# Patient Record
Sex: Female | Born: 1950
Health system: Southern US, Community
[De-identification: ages and names within clinical notes are randomized; demographics above are authoritative.]

## PROBLEM LIST (undated history)

## (undated) DIAGNOSIS — I10 Essential (primary) hypertension: Secondary | ICD-10-CM

## (undated) DIAGNOSIS — K219 Gastro-esophageal reflux disease without esophagitis: Secondary | ICD-10-CM

## (undated) DIAGNOSIS — C8590 Non-Hodgkin lymphoma, unspecified, unspecified site: Secondary | ICD-10-CM

## (undated) DIAGNOSIS — E669 Obesity, unspecified: Secondary | ICD-10-CM

## (undated) DIAGNOSIS — C801 Malignant (primary) neoplasm, unspecified: Secondary | ICD-10-CM

## (undated) DIAGNOSIS — E785 Hyperlipidemia, unspecified: Secondary | ICD-10-CM

## (undated) DIAGNOSIS — H269 Unspecified cataract: Secondary | ICD-10-CM

## (undated) DIAGNOSIS — M1990 Unspecified osteoarthritis, unspecified site: Secondary | ICD-10-CM

## (undated) DIAGNOSIS — T7840XA Allergy, unspecified, initial encounter: Secondary | ICD-10-CM

## (undated) DIAGNOSIS — N200 Calculus of kidney: Secondary | ICD-10-CM

## (undated) DIAGNOSIS — M359 Systemic involvement of connective tissue, unspecified: Secondary | ICD-10-CM

## (undated) DIAGNOSIS — J449 Chronic obstructive pulmonary disease, unspecified: Secondary | ICD-10-CM

## (undated) DIAGNOSIS — M069 Rheumatoid arthritis, unspecified: Secondary | ICD-10-CM

## (undated) DIAGNOSIS — M810 Age-related osteoporosis without current pathological fracture: Secondary | ICD-10-CM

## (undated) HISTORY — DX: Obesity, unspecified: E66.9

## (undated) HISTORY — DX: Hyperlipidemia, unspecified: E78.5

## (undated) HISTORY — DX: Gastro-esophageal reflux disease without esophagitis: K21.9

## (undated) HISTORY — PX: BREAST BIOPSY: SHX20

## (undated) HISTORY — DX: Essential (primary) hypertension: I10

## (undated) HISTORY — DX: Calculus of kidney: N20.0

## (undated) HISTORY — DX: Age-related osteoporosis without current pathological fracture: M81.0

## (undated) HISTORY — DX: Unspecified porphyria: E80.20

## (undated) HISTORY — DX: Rheumatoid arthritis, unspecified: M06.9

## (undated) HISTORY — DX: Unspecified osteoarthritis, unspecified site: M19.90

## (undated) HISTORY — DX: Allergy, unspecified, initial encounter: T78.40XA

## (undated) HISTORY — DX: Unspecified cataract: H26.9

## (undated) HISTORY — DX: Malignant (primary) neoplasm, unspecified: C80.1

---

## 1981-05-26 HISTORY — PX: APPENDECTOMY: SHX54

## 1991-05-27 DIAGNOSIS — M069 Rheumatoid arthritis, unspecified: Secondary | ICD-10-CM

## 1991-05-27 HISTORY — DX: Rheumatoid arthritis, unspecified: M06.9

## 1997-05-26 DIAGNOSIS — N200 Calculus of kidney: Secondary | ICD-10-CM

## 1997-05-26 HISTORY — DX: Calculus of kidney: N20.0

## 1998-05-26 HISTORY — PX: LITHOTRIPSY: SUR834

## 2000-10-08 ENCOUNTER — Encounter (HOSPITAL_COMMUNITY): Admission: RE | Admit: 2000-10-08 | Discharge: 2000-11-07 | Payer: Self-pay | Admitting: Rheumatology

## 2000-12-24 ENCOUNTER — Encounter (HOSPITAL_COMMUNITY): Admission: RE | Admit: 2000-12-24 | Discharge: 2001-01-23 | Payer: Self-pay | Admitting: Rheumatology

## 2001-03-18 ENCOUNTER — Encounter (HOSPITAL_COMMUNITY): Admission: RE | Admit: 2001-03-18 | Discharge: 2001-04-17 | Payer: Self-pay | Admitting: Rheumatology

## 2001-06-17 ENCOUNTER — Encounter (HOSPITAL_COMMUNITY): Admission: RE | Admit: 2001-06-17 | Discharge: 2001-07-17 | Payer: Self-pay | Admitting: Rheumatology

## 2001-10-21 ENCOUNTER — Encounter (HOSPITAL_COMMUNITY): Admission: RE | Admit: 2001-10-21 | Discharge: 2001-11-20 | Payer: Self-pay | Admitting: Rheumatology

## 2002-03-17 ENCOUNTER — Ambulatory Visit (HOSPITAL_COMMUNITY): Admission: RE | Admit: 2002-03-17 | Discharge: 2002-03-17 | Payer: Self-pay | Admitting: Family Medicine

## 2002-03-17 ENCOUNTER — Encounter: Payer: Self-pay | Admitting: Family Medicine

## 2002-07-14 ENCOUNTER — Encounter (HOSPITAL_COMMUNITY): Admission: RE | Admit: 2002-07-14 | Discharge: 2002-08-13 | Payer: Self-pay | Admitting: Rheumatology

## 2003-05-29 ENCOUNTER — Ambulatory Visit (HOSPITAL_COMMUNITY): Admission: RE | Admit: 2003-05-29 | Discharge: 2003-05-29 | Payer: Self-pay | Admitting: Family Medicine

## 2003-06-07 ENCOUNTER — Ambulatory Visit (HOSPITAL_COMMUNITY): Admission: RE | Admit: 2003-06-07 | Discharge: 2003-06-07 | Payer: Self-pay | Admitting: Family Medicine

## 2004-05-17 ENCOUNTER — Ambulatory Visit: Payer: Self-pay | Admitting: Family Medicine

## 2004-05-30 ENCOUNTER — Ambulatory Visit (HOSPITAL_COMMUNITY): Admission: RE | Admit: 2004-05-30 | Discharge: 2004-05-30 | Payer: Self-pay | Admitting: Family Medicine

## 2004-07-05 ENCOUNTER — Ambulatory Visit: Payer: Self-pay | Admitting: Family Medicine

## 2004-07-09 ENCOUNTER — Encounter: Payer: Self-pay | Admitting: Family Medicine

## 2004-08-30 ENCOUNTER — Ambulatory Visit: Payer: Self-pay | Admitting: Family Medicine

## 2004-10-10 ENCOUNTER — Ambulatory Visit (HOSPITAL_COMMUNITY): Admission: RE | Admit: 2004-10-10 | Discharge: 2004-10-10 | Payer: Self-pay | Admitting: General Surgery

## 2004-10-10 LAB — HM COLONOSCOPY: HM Colonoscopy: NORMAL

## 2005-04-28 ENCOUNTER — Ambulatory Visit: Payer: Self-pay | Admitting: Family Medicine

## 2005-08-08 ENCOUNTER — Ambulatory Visit: Payer: Self-pay | Admitting: Family Medicine

## 2006-01-01 ENCOUNTER — Ambulatory Visit (HOSPITAL_COMMUNITY): Admission: RE | Admit: 2006-01-01 | Discharge: 2006-01-01 | Payer: Self-pay | Admitting: Family Medicine

## 2006-01-16 ENCOUNTER — Encounter: Admission: RE | Admit: 2006-01-16 | Discharge: 2006-01-16 | Payer: Self-pay | Admitting: Family Medicine

## 2006-06-16 ENCOUNTER — Ambulatory Visit: Payer: Self-pay | Admitting: Family Medicine

## 2006-06-16 ENCOUNTER — Encounter: Payer: Self-pay | Admitting: Family Medicine

## 2006-06-16 ENCOUNTER — Other Ambulatory Visit: Admission: RE | Admit: 2006-06-16 | Discharge: 2006-06-16 | Payer: Self-pay | Admitting: Family Medicine

## 2006-09-22 ENCOUNTER — Ambulatory Visit: Payer: Self-pay | Admitting: Family Medicine

## 2006-12-12 ENCOUNTER — Emergency Department (HOSPITAL_COMMUNITY): Admission: EM | Admit: 2006-12-12 | Discharge: 2006-12-12 | Payer: Self-pay | Admitting: Emergency Medicine

## 2006-12-15 ENCOUNTER — Ambulatory Visit: Payer: Self-pay | Admitting: Family Medicine

## 2007-04-30 ENCOUNTER — Encounter: Payer: Self-pay | Admitting: Family Medicine

## 2007-06-17 ENCOUNTER — Encounter: Payer: Self-pay | Admitting: Family Medicine

## 2007-06-17 LAB — CONVERTED CEMR LAB: Pap Smear: NORMAL

## 2007-08-09 ENCOUNTER — Ambulatory Visit: Payer: Self-pay | Admitting: Family Medicine

## 2007-08-12 DIAGNOSIS — E785 Hyperlipidemia, unspecified: Secondary | ICD-10-CM

## 2007-08-12 DIAGNOSIS — I10 Essential (primary) hypertension: Secondary | ICD-10-CM | POA: Insufficient documentation

## 2007-10-06 ENCOUNTER — Encounter: Payer: Self-pay | Admitting: Family Medicine

## 2007-10-06 LAB — CONVERTED CEMR LAB
CO2: 26 meq/L (ref 19–32)
Chloride: 104 meq/L (ref 96–112)
Cholesterol: 228 mg/dL — ABNORMAL HIGH (ref 0–200)
Glucose, Bld: 82 mg/dL (ref 70–99)
Lymphocytes Relative: 38 % (ref 12–46)
Lymphs Abs: 2.4 10*3/uL (ref 0.7–4.0)
Neutro Abs: 3.1 10*3/uL (ref 1.7–7.7)
Neutrophils Relative %: 48 % (ref 43–77)
Platelets: 284 10*3/uL (ref 150–400)
Potassium: 4.7 meq/L (ref 3.5–5.3)
Sodium: 140 meq/L (ref 135–145)
TSH: 0.933 microintl units/mL (ref 0.350–5.50)
Total CHOL/HDL Ratio: 4.8
VLDL: 26 mg/dL (ref 0–40)
WBC: 6.4 10*3/uL (ref 4.0–10.5)

## 2007-10-07 ENCOUNTER — Ambulatory Visit: Payer: Self-pay | Admitting: Family Medicine

## 2007-10-07 ENCOUNTER — Encounter: Payer: Self-pay | Admitting: Family Medicine

## 2007-10-07 ENCOUNTER — Other Ambulatory Visit: Admission: RE | Admit: 2007-10-07 | Discharge: 2007-10-07 | Payer: Self-pay | Admitting: Family Medicine

## 2007-10-07 LAB — CONVERTED CEMR LAB
AST: 17 units/L (ref 0–37)
Albumin: 4 g/dL (ref 3.5–5.2)
Total Bilirubin: 0.3 mg/dL (ref 0.3–1.2)

## 2008-02-22 ENCOUNTER — Ambulatory Visit: Payer: Self-pay | Admitting: Family Medicine

## 2008-02-22 DIAGNOSIS — J209 Acute bronchitis, unspecified: Secondary | ICD-10-CM

## 2008-02-22 DIAGNOSIS — J0100 Acute maxillary sinusitis, unspecified: Secondary | ICD-10-CM

## 2008-05-20 ENCOUNTER — Emergency Department (HOSPITAL_COMMUNITY): Admission: EM | Admit: 2008-05-20 | Discharge: 2008-05-20 | Payer: Self-pay | Admitting: Emergency Medicine

## 2008-05-22 ENCOUNTER — Ambulatory Visit: Payer: Self-pay | Admitting: Family Medicine

## 2008-05-22 DIAGNOSIS — J029 Acute pharyngitis, unspecified: Secondary | ICD-10-CM

## 2008-05-22 DIAGNOSIS — R5383 Other fatigue: Secondary | ICD-10-CM

## 2008-05-22 DIAGNOSIS — R5381 Other malaise: Secondary | ICD-10-CM

## 2008-05-22 LAB — CONVERTED CEMR LAB
Eosinophils Relative: 2 % (ref 0–5)
HCT: 42.2 % (ref 36.0–46.0)
Lymphocytes Relative: 29 % (ref 12–46)
Lymphs Abs: 2.5 10*3/uL (ref 0.7–4.0)
Platelets: 285 10*3/uL (ref 150–400)
WBC: 8.6 10*3/uL (ref 4.0–10.5)

## 2008-08-24 ENCOUNTER — Encounter: Payer: Self-pay | Admitting: Family Medicine

## 2008-08-28 ENCOUNTER — Telehealth: Payer: Self-pay | Admitting: Family Medicine

## 2008-08-29 ENCOUNTER — Encounter: Payer: Self-pay | Admitting: Family Medicine

## 2008-11-03 ENCOUNTER — Ambulatory Visit: Payer: Self-pay | Admitting: Family Medicine

## 2008-11-03 ENCOUNTER — Other Ambulatory Visit: Admission: RE | Admit: 2008-11-03 | Discharge: 2008-11-03 | Payer: Self-pay | Admitting: Family Medicine

## 2008-11-03 ENCOUNTER — Encounter: Payer: Self-pay | Admitting: Family Medicine

## 2009-03-02 ENCOUNTER — Encounter: Payer: Self-pay | Admitting: Family Medicine

## 2009-06-12 ENCOUNTER — Encounter: Payer: Self-pay | Admitting: Family Medicine

## 2009-09-04 ENCOUNTER — Encounter (INDEPENDENT_AMBULATORY_CARE_PROVIDER_SITE_OTHER): Payer: Self-pay

## 2009-09-04 ENCOUNTER — Ambulatory Visit: Payer: Self-pay | Admitting: Family Medicine

## 2009-09-04 DIAGNOSIS — J309 Allergic rhinitis, unspecified: Secondary | ICD-10-CM

## 2009-09-04 DIAGNOSIS — M25579 Pain in unspecified ankle and joints of unspecified foot: Secondary | ICD-10-CM

## 2009-12-11 ENCOUNTER — Ambulatory Visit (HOSPITAL_COMMUNITY): Admission: RE | Admit: 2009-12-11 | Discharge: 2009-12-11 | Payer: Self-pay | Admitting: Family Medicine

## 2009-12-18 ENCOUNTER — Ambulatory Visit: Payer: Self-pay | Admitting: Family Medicine

## 2009-12-18 ENCOUNTER — Other Ambulatory Visit: Admission: RE | Admit: 2009-12-18 | Discharge: 2009-12-18 | Payer: Self-pay | Admitting: Family Medicine

## 2009-12-18 LAB — HM PAP SMEAR: HM Pap smear: NORMAL

## 2009-12-19 ENCOUNTER — Encounter: Payer: Self-pay | Admitting: Family Medicine

## 2009-12-19 LAB — CONVERTED CEMR LAB: Pap Smear: NEGATIVE

## 2010-06-11 ENCOUNTER — Telehealth: Payer: Self-pay | Admitting: Family Medicine

## 2010-06-15 ENCOUNTER — Encounter: Payer: Self-pay | Admitting: Family Medicine

## 2010-06-16 ENCOUNTER — Encounter: Payer: Self-pay | Admitting: Family Medicine

## 2010-06-17 ENCOUNTER — Encounter: Payer: Self-pay | Admitting: Family Medicine

## 2010-06-25 NOTE — Miscellaneous (Signed)
Summary: flu shot  Clinical Lists Changes  Observations: Added new observation of FLU VAX: Historical (05/17/2009 15:42)      Influenza Immunization History:    Influenza # 1:  Historical (05/17/2009)

## 2010-06-25 NOTE — Assessment & Plan Note (Signed)
Summary: ov   Vital Signs:  Patient profile:   60 year old female Height:      67.5 inches Weight:      213.25 pounds BMI:     33.03 O2 Sat:      98 % Pulse rate:   94 / minute Pulse rhythm:   regular Resp:     16 per minute BP sitting:   128 / 80  (right arm) Cuff size:   large  Vitals Entered By: Everitt Amber LPN (September 04, 2009 3:24 PM)  Nutrition Counseling: Patient's BMI is greater than 25 and therefore counseled on weight management options. CC: has been sneezing and sunday she got really congested and started getting worse, itchy throat, nose running and right ear feels stopped up, coughing at night from drainage   CC:  has been sneezing and sunday she got really congested and started getting worse, itchy throat, nose running and right ear feels stopped up, and coughing at night from drainage.  History of Present Illness: Pt was in good health up until 1 week ago when she developed progressive head and chest congestion. in the past 3 days she has worsened to the extent that she feels unable to work. She has been attemptin g lifestyle change to improve her health as far as diet and physical activitare concerned. 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  Current Medications (verified): 1)  Fish Oil Concentrate 1000 Mg  Caps (Omega-3 Fatty Acids) .... Take 1 Capsule By Mouth Once A Day 2)  Caltrate 600+d Plus 600-400 Mg-Unit  Tabs (Calcium Carbonate-Vit D-Min) .... Take 1 Tablet By Mouth Two Times A Day 3)  Centrum Silver   Tabs (Multiple Vitamins-Minerals) .... Take 1 Tablet By Mouth Once A Day 4)  Aspirin Adult Low Strength 81 Mg Tbec (Aspirin) .... Take 1 Tab By Mouth Once Daily 5)  Lydia Pinkham  Tabs (Misc Natural Products) .... 2 Tabs By Mouth Once Daily 6)  Allegra-D 12 Hour 60-120 Mg Xr12h-Tab (Fexofenadine-Pseudoephedrine) .... Take 1 Tablet By Mouth Two Times A Day 7)  Ibuprofen 800 Mg Tabs (Ibuprofen)  .... Take 1 Tablet By Mouth Three Times A Day 8)  Crestor 10 Mg Tabs (Rosuvastatin Calcium) .... Take 1 Tab By Mouth At Bedtime 9)  Diovan 80 Mg Tabs (Valsartan) .... Take 1 Tab By Mouth At Bedtime  Allergies (verified): 1)  ! Dilantin 2)  ! Barbiturates 3)  ! * Alcohol 4)  ! Vioxx 5)  ! Vicodin 6)  ! Ceftin 7)  ! * Librium 8)  ! * Diabenese 9)  ! * Porphrisa 10)  ! * Oriviase 11)  ! * Na Pentithal  Review of Systems      See HPI General:  Complains of chills, fatigue, and weakness; denies fever. Eyes:  Denies blurring, discharge, and red eye. ENT:  Complains of decreased hearing, earache, nasal congestion, postnasal drainage, sinus pressure, and sore throat; 1 week history, fatigue, no skleep cough, generalised aches , low grade fever. CV:  Denies chest pain or discomfort, difficulty breathing while lying down, palpitations, and swelling of feet. Resp:  Complains of cough, sputum productive, and wheezing; cough and scant sputum which is yellow in the past 5 days. GI:  Denies abdominal pain, constipation, diarrhea, nausea, and vomiting. GU:  Denies dysuria and urinary frequency. MS:  Complains of joint pain and stiffness; right ankle pain and stiffness persisteing, limiting pt's ability  to walk for exercise. Derm:  Denies itching, lesion(s), and rash. Neuro:  Complains of headaches; denies memory loss, poor balance, seizures, and sensation of room spinning. Psych:  Denies anxiety and depression. Endo:  Denies cold intolerance, excessive hunger, excessive thirst, excessive urination, heat intolerance, polyuria, and weight change. Heme:  Denies abnormal bruising and bleeding. Allergy:  Complains of seasonal allergies and sneezing; denies itching eyes; 12week h/o progressive symptoms.  Physical Exam  General:  ill appearing, in no c/p distress. HEENT: No facial asymmetry,  EOMI, ethmoidal and maxillary sinus tenderness, TM's dull with poor light reflex, oropharynx  pink and moist.  post cervical adenitis  Chest: adequate air entry, scattered crackles, few wheezes  CVS: S1, S2, No murmurs, No S3.   Abd: Soft, Nontender.  MS: Adequate ROM spine, hips, shoulders and knees. decrteased rOM left ankle with swelling and tenderness on the lateral aspect. Ext: No edema.   CNS: CN 2-12 intact, power tone and sensation normal throughout.   Skin: Intact, no visible lesions or rashes.  Psych: Good eye contact, normal affect.  Memory intact, not anxious or depressed appearing.     Impression & Recommendations:  Problem # 1:  ANKLE PAIN, RIGHT (ICD-719.47) Assessment Unchanged  Orders: Orthopedic Referral (Ortho)  Problem # 2:  ALLERGIC RHINITIS (ICD-477.9) Assessment: Deteriorated  The following medications were removed from the medication list:    Astelin 137 Mcg/spray Soln (Azelastine hcl) .Marland Kitchen..Marland Kitchen Two sprays each nostril twice daily    Rhinocort Aqua 32 Mcg/act Susp (Budesonide (nasal)) .Marland Kitchen... Take 2 sprays in each nostril once a day    Loratadine 10 Mg Tabs (Loratadine) .Marland Kitchen... Take 1 tablet by mouth once a day  Orders: Depo- Medrol 80mg  (J1040) Admin of Therapeutic Inj  intramuscular or subcutaneous ([address])  Problem # 3:  ACUTE BRONCHITIS (ICD-466.0) Assessment: Comment Only  The following medications were removed from the medication list:    Levaquin 750 Mg Tabs (Levofloxacin) .Marland Kitchen... Take 1 tablet by mouth once a day    Tessalon Perles 100 Mg Caps (Benzonatate) .Marland Kitchen... Take 1 capsule by mouth three times a day    Tussionex Pennkinetic Er 8-10 Mg/26ml Lqcr (Chlorpheniramine-hydrocodone) .Marland Kitchen... 5cc twice daily as needed    Symbicort 80-4.5 Mcg/act Aero (Budesonide-formoterol fumarate) .Marland Kitchen... 2 puffs twice daily    Zithromax 250 Mg Tabs (Azithromycin) .Marland Kitchen... Take 2 tablets today then 1 daily for an additional 4 days. Her updated medication list for this problem includes:    Allegra-d 12 Hour 60-120 Mg Xr12h-tab (Fexofenadine-pseudoephedrine) .Marland Kitchen... Take 1 tablet by mouth two  times a day    Zithromax Z-pak 250 Mg Tabs (Azithromycin) ..... Use as directed    Singulair 10 Mg Tabs (Montelukast sodium) .Marland Kitchen... Take 1 tablet by mouth once a day  Problem # 4:  ACUTE MAXILLARY SINUSITIS (ICD-461.0) Assessment: Comment Only  The following medications were removed from the medication list:    Astelin 137 Mcg/spray Soln (Azelastine hcl) .Marland Kitchen..Marland Kitchen Two sprays each nostril twice daily    Rhinocort Aqua 32 Mcg/act Susp (Budesonide (nasal)) .Marland Kitchen... Take 2 sprays in each nostril once a day    Levaquin 750 Mg Tabs (Levofloxacin) .Marland Kitchen... Take 1 tablet by mouth once a day    Tessalon Perles 100 Mg Caps (Benzonatate) .Marland Kitchen... Take 1 capsule by mouth three times a day    Tussionex Pennkinetic Er 8-10 Mg/45ml Lqcr (Chlorpheniramine-hydrocodone) .Marland Kitchen... 5cc twice daily as needed    Zithromax 250 Mg Tabs (Azithromycin) .Marland Kitchen... Take 2 tablets today then 1 daily for an additional  4 days. Her updated medication list for this problem includes:    Allegra-d 12 Hour 60-120 Mg Xr12h-tab (Fexofenadine-pseudoephedrine) .Marland Kitchen... Take 1 tablet by mouth two times a day    Zithromax Z-pak 250 Mg Tabs (Azithromycin) ..... Use as directed  Problem # 5:  HYPERTENSION (ICD-401.9) Assessment: Unchanged  The following medications were removed from the medication list:    Diovan 80 Mg Tabs (Valsartan) ..... One tab by mouth qd Her updated medication list for this problem includes:    Diovan 80 Mg Tabs (Valsartan) .Marland Kitchen... Take 1 tab by mouth at bedtime  BP today: 128/80 Prior BP: 112/80 (11/03/2008)  Labs Reviewed: K+: 4.7 (10/06/2007) Creat: : 0.95 (10/06/2007)   Chol: 228 (10/06/2007)   HDL: 48 (10/06/2007)   LDL: 154 (10/06/2007)   TG: 128 (10/06/2007)  Problem # 6:  HYPERLIPIDEMIA (ICD-272.4) Assessment: Comment Only  Her updated medication list for this problem includes:    Crestor 10 Mg Tabs (Rosuvastatin calcium) .Marland Kitchen... Take 1 tab by mouth at bedtime  Labs Reviewed: SGOT: 17 (10/07/2007)   SGPT: 19  (10/07/2007)   HDL:48 (10/06/2007)  LDL:154 (10/06/2007)  Chol:228 (10/06/2007)  Trig:128 (10/06/2007)needs fasting labs  Complete Medication List: 1)  Fish Oil Concentrate 1000 Mg Caps (Omega-3 fatty acids) .... Take 1 capsule by mouth once a day 2)  Caltrate 600+d Plus 600-400 Mg-unit Tabs (Calcium carbonate-vit d-min) .... Take 1 tablet by mouth two times a day 3)  Centrum Silver Tabs (Multiple vitamins-minerals) .... Take 1 tablet by mouth once a day 4)  Aspirin Adult Low Strength 81 Mg Tbec (Aspirin) .... Take 1 tab by mouth once daily 5)  Lydia Pinkham Tabs (Misc natural products) .... 2 tabs by mouth once daily 6)  Allegra-d 12 Hour 60-120 Mg Xr12h-tab (Fexofenadine-pseudoephedrine) .... Take 1 tablet by mouth two times a day 7)  Ibuprofen 800 Mg Tabs (Ibuprofen) .... Take 1 tablet by mouth three times a day 8)  Crestor 10 Mg Tabs (Rosuvastatin calcium) .... Take 1 tab by mouth at bedtime 9)  Diovan 80 Mg Tabs (Valsartan) .... Take 1 tab by mouth at bedtime 10)  Prednisone (pak) 10 Mg Tabs (Prednisone) .... Use as directed 11)  Zithromax Z-pak 250 Mg Tabs (Azithromycin) .... Use as directed 12)  Singulair 10 Mg Tabs (Montelukast sodium) .... Take 1 tablet by mouth once a day  Patient Instructions: 1)  CPE when due  end July 2)  You are being  treated for uncontrolled allergies, you will get injection in the offfice and meds are sent in also Prescriptions: SINGULAIR 10 MG TABS (MONTELUKAST SODIUM) Take 1 tablet by mouth once a day  #30 x 0   Entered and Authorized by:   Syliva Overman MD   Signed by:   Syliva Overman MD on 09/04/2009   Method used:   Printed then faxed to ...       [facility] [address] [facility] (retail)       [address]       [place], [place]  [address]       Ph: [phone]       Fax: [phone]   RxID:   [id number] ZITHROMAX Z-PAK 250 MG TABS (AZITHROMYCIN) Use as directed  #6 x 0   Entered and Authorized by:   Syliva Overman MD    Signed by:   Syliva Overman MD on 09/04/2009   Method used:   Electronically to  [facility] [address] [facility] (retail)       [address]       [place], [place]  [address]       Ph: [phone]       Fax: [phone]   RxID:   [id number] PREDNISONE (PAK) 10 MG TABS (PREDNISONE) Use as directed  #21 x 0   Entered and Authorized by:   Syliva Overman MD   Signed by:   Syliva Overman MD on 09/04/2009   Method used:   Electronically to        [facility]  [address] (retail)       [address]       [place], [place]  [address]       Ph: [phone]       Fax: [phone]   RxID:   [id number]      Medication Administration  Injection # 1:    Medication: Depo- Medrol 80mg     Diagnosis: ALLERGIC RHINITIS (ICD-477.9)    Route: IM    Site: RUOQ gluteus    Exp Date: 03/2010    Lot #: [id number]     Mfr: Pharmacia    Comments: 80mg  given     Patient tolerated injection without complications    Given by: Everitt Amber LPN (September 04, 2009 4:33 PM)  Orders Added: 1)  Est. Patient Level IV [[address]] 2)  Orthopedic Referral [Ortho] 3)  Depo- Medrol 80mg  [J1040] 4)  Admin of Therapeutic Inj  intramuscular or subcutaneous [[address]]

## 2010-06-25 NOTE — Assessment & Plan Note (Signed)
Summary: physical   Vital Signs:  Patient profile:   60 year old female Menstrual status:  postmenopausal Height:      67.5 inches Weight:      210 pounds BMI:     32.52 O2 Sat:      98 % Pulse rate:   87 / minute Pulse rhythm:   regular Resp:     16 per minute BP sitting:   120 / 80  (left arm) Cuff size:   large  Vitals Entered By: Everitt Amber LPN (December 18, 2009 11:06 AM)  Nutrition Counseling: Patient's BMI is greater than 25 and therefore counseled on weight management options. CC: CPE      Menstrual Status postmenopausal Last PAP Result NEGATIVE FOR INTRAEPITHELIAL LESIONS OR MALIGNANCY.   CC:  CPE .  History of Present Illness: Reports  that she has been  doing well. Denies recent fever or chills. Denies sinus pressure, nasal congestion , ear pain or sore throat. Denies chest congestion, or cough productive of sputum. Denies chest pain, palpitations, PND, orthopnea or leg swelling. Denies abdominal pain, nausea, vomitting, diarrhea or constipation. Denies change in bowel movements or bloody stool. Denies dysuria , frequency, incontinence or hesitancy. Denies  joint pain, swelling, or reduced mobility. Denies headaches, vertigo, seizures. Denies depression, anxiety or insomnia. insect bite to left leg this morning, mild reness.     Current Medications (verified): 1)  Fish Oil Concentrate 1000 Mg  Caps (Omega-3 Fatty Acids) .... Take 1 Capsule By Mouth Once A Day 2)  Caltrate 600+d Plus 600-400 Mg-Unit  Tabs (Calcium Carbonate-Vit D-Min) .... Take 1 Tablet By Mouth Two Times A Day 3)  Centrum Silver   Tabs (Multiple Vitamins-Minerals) .... Take 1 Tablet By Mouth Once A Day 4)  Aspirin Adult Low Strength 81 Mg Tbec (Aspirin) .... Take 1 Tab By Mouth Once Daily 5)  Lydia Pinkham  Tabs (Misc Natural Products) .... 2 Tabs By Mouth Once Daily 6)  Ibuprofen 800 Mg Tabs (Ibuprofen) .... Take 1 Tablet By Mouth Three Times A Day 7)  Crestor 10 Mg Tabs (Rosuvastatin  Calcium) .... Take 1 Tab By Mouth At Bedtime 8)  Diovan 80 Mg Tabs (Valsartan) .... Take 1 Tab By Mouth At Bedtime 9)  Singulair 10 Mg Tabs (Montelukast Sodium) .... Take 1 Tablet By Mouth Once A Day  Allergies (verified): 1)  ! Dilantin 2)  ! Barbiturates 3)  ! * Alcohol 4)  ! Vioxx 5)  ! Vicodin 6)  ! Ceftin 7)  ! * Librium 8)  ! * Diabenese 9)  ! * Porphrisa 10)  ! * Oriviase 11)  ! * Na Pentithal  Review of Systems      See HPI General:  Complains of fatigue. Eyes:  Denies blurring and discharge. Endo:  Denies cold intolerance, excessive hunger, excessive thirst, excessive urination, and heat intolerance. Heme:  Denies abnormal bruising and bleeding. Allergy:  Complains of seasonal allergies; denies hives or rash and itching eyes.  Physical Exam  General:  Well-developed,well-nourished,in no acute distress; alert,appropriate and cooperative throughout examination Head:  Normocephalic and atraumatic without obvious abnormalities. No apparent alopecia or balding. Eyes:  No corneal or conjunctival inflammation noted. EOMI. Perrla. Funduscopic exam benign, without hemorrhages, exudates or papilledema. Vision grossly normal. Ears:  External ear exam shows no significant lesions or deformities.  Otoscopic examination reveals clear canals, tympanic membranes are intact bilaterally without bulging, retraction, inflammation or discharge. Hearing is grossly normal bilaterally. Nose:  External nasal examination shows  no deformity or inflammation. Nasal mucosa are pink and moist without lesions or exudates. Mouth:  Oral mucosa and oropharynx without lesions or exudates.  Teeth in good repair. Neck:  No deformities, masses, or tenderness noted. Chest Wall:  No deformities, masses, or tenderness noted. Breasts:  No mass, nodules, thickening, tenderness, bulging, retraction, inflamation, nipple discharge or skin changes noted.   Lungs:  Normal respiratory effort, chest expands symmetrically.  Lungs are clear to auscultation, no crackles or wheezes. Heart:  Normal rate and regular rhythm. S1 and S2 normal without gallop, murmur, click, rub or other extra sounds. Abdomen:  Bowel sounds positive,abdomen soft and non-tender without masses, organomegaly or hernias noted. Rectal:  No external abnormalities noted. Normal sphincter tone. No rectal masses or tenderness.guaic negative stool Genitalia:  Normal introitus for age, no external lesions, no vaginal discharge, mucosa pink and moist, no vaginal or cervical lesions, no vaginal atrophy, no friaility or hemorrhage, normal uterus size and position, no adnexal masses or tenderness Msk:  No deformity or scoliosis noted of thoracic or lumbar spine.   Pulses:  R and L carotid,radial,femoral,dorsalis pedis and posterior tibial pulses are full and equal bilaterally Extremities:  No clubbing, cyanosis, edema, or deformity noted with normal full range of motion of all joints.   Neurologic:  No cranial nerve deficits noted. Station and gait are normal. Plantar reflexes are down-going bilaterally. DTRs are symmetrical throughout. Sensory, motor and coordinative functions appear intact. Skin:  Intact erythematous macular lesionon post left leg where pt had insect bite today Cervical Nodes:  No lymphadenopathy noted Axillary Nodes:  No palpable lymphadenopathy Inguinal Nodes:  No significant adenopathy Psych:  Cognition and judgment appear intact. Alert and cooperative with normal attention span and concentration. No apparent delusions, illusions, hallucinations   Impression & Recommendations:  Problem # 1:  ALLERGIC RHINITIS (ICD-477.9) Assessment Improved  Problem # 2:  ANKLE PAIN, RIGHT (ICD-719.47) Assessment: Improved pt saw ortho andhas been using topical anti-inflammatory, prn  Problem # 3:  HYPERTENSION (ICD-401.9) Assessment: Unchanged  Her updated medication list for this problem includes:    Diovan 80 Mg Tabs (Valsartan) .Marland Kitchen... Take  1 tab by mouth at bedtime  Orders: T-Basic Metabolic Panel [phone])  BP today: 120/80 Prior BP: 128/80 (09/04/2009)  Labs Reviewed: K+: 4.7 (10/06/2007) Creat: : 0.95 (10/06/2007)   Chol: 228 (10/06/2007)   HDL: 48 (10/06/2007)   LDL: 154 (10/06/2007)   TG: 128 (10/06/2007)  Problem # 4:  HYPERLIPIDEMIA (ICD-272.4) Assessment: Comment Only  Her updated medication list for this problem includes:    Crestor 10 Mg Tabs (Rosuvastatin calcium) .Marland Kitchen... Take 1 tab by mouth at bedtime  Orders: T-Hepatic Function [id number]), past due T-Lipid Profile [id number])  Labs Reviewed: SGOT: 17 (10/07/2007)   SGPT: 19 (10/07/2007)   HDL:48 (10/06/2007)  LDL:154 (10/06/2007)  Chol:228 (10/06/2007)  Trig:128 (10/06/2007)  Complete Medication List: 1)  Fish Oil Concentrate 1000 Mg Caps (Omega-3 fatty acids) .... Take 1 capsule by mouth once a day 2)  Caltrate 600+d Plus 600-400 Mg-unit Tabs (Calcium carbonate-vit d-min) .... Take 1 tablet by mouth two times a day 3)  Centrum Silver Tabs (Multiple vitamins-minerals) .... Take 1 tablet by mouth once a day 4)  Aspirin Adult Low Strength 81 Mg Tbec (Aspirin) .... Take 1 tab by mouth once daily 5)  Lydia Pinkham Tabs (Misc natural products) .... 2 tabs by mouth once daily 6)  Ibuprofen 800 Mg Tabs (Ibuprofen) .... Take 1 tablet by mouth three times a day 7)  Crestor  10 Mg Tabs (Rosuvastatin calcium) .... Take 1 tab by mouth at bedtime 8)  Diovan 80 Mg Tabs (Valsartan) .... Take 1 tab by mouth at bedtime 9)  Singulair 10 Mg Tabs (Montelukast sodium) .... Take 1 tablet by mouth once a day  Other Orders: T-CBC w/Diff [id number]) T-TSH [phone]) T-Vitamin D (25-Hydroxy) [phone])  Patient Instructions: 1)  cPE in 1 year. 2)  F/U in 5 months and 3 weeks 3)  continue the regular exercise, and healthy eating  4)  BMP prior to visit, ICD-9: 5)  Hepatic Panel prior to visit, ICD-9: 6)  Lipid Panel prior to visit, ICD-9: 7)  TSH prior  to visit, ICD-9: 8)  CBC w/ Diff prior to visit, ICD-9:  fasting asap 9)  vit D 10)  No new meds at this time

## 2010-06-25 NOTE — Letter (Signed)
Summary: Out of Work  [facility]  [address]   [place], [place] [address]   Phone: [phone]  Fax: [phone]    September 04, 2009   Employee:  HISAKO BUGH    To Whom It May Concern:   For Medical reasons, please excuse the above named employee from work for the following dates:  Start:   09/04/2009    End:   09/06/2009 Without Restricitons  If you need additional information, please feel free to contact our office.         Sincerely,   Milus Mallick. Lodema Hong, M.D.

## 2010-06-25 NOTE — Letter (Signed)
Summary: Letter  Letter   Imported By: Lind Guest 12/20/2009 13:12:51  _____________________________________________________________________  External Attachment:    Type:   Image     Comment:   External Document

## 2010-06-27 NOTE — Progress Notes (Signed)
Summary: refill  Phone Note Call from Patient   Summary of Call: needs a new rx for blood pressure meds.  [facility] [phone] Initial call taken by: Rudene Anda,  June 11, 2010 2:33 PM    Prescriptions: DIOVAN 80 MG TABS (VALSARTAN) Take 1 tab by mouth at bedtime  #30 x 0   Entered by:   Adella Hare LPN   Authorized by:   Syliva Overman MD   Signed by:   Adella Hare LPN on 56/21/3086   Method used:   Electronically to        [facility]  [address] (retail)       [address]       [place], [place]  [address]       Ph: [phone]       Fax: [phone]   RxID:   [id number]

## 2010-07-04 ENCOUNTER — Telehealth: Payer: Self-pay | Admitting: Family Medicine

## 2010-07-11 NOTE — Progress Notes (Signed)
Summary: speak with nurse  Phone Note Call from Patient   Summary of Call: pt needs to speak with nurse about getting blood pressure pills. [facility]  cell [phone] Initial call taken by: Rudene Anda,  July 04, 2010 2:13 PM    Prescriptions: DIOVAN 80 MG TABS (VALSARTAN) Take 1 tab by mouth at bedtime  #30 x 0   Entered by:   Adella Hare LPN   Authorized by:   Syliva Overman MD   Signed by:   Adella Hare LPN on 16/02/9603   Method used:   Electronically to        [facility]  [address] (retail)       [address]       [place], [place]  [address]       Ph: [phone]       Fax: [phone]   RxID:   [id number]

## 2010-07-30 ENCOUNTER — Encounter: Payer: Self-pay | Admitting: Family Medicine

## 2010-07-31 ENCOUNTER — Ambulatory Visit (INDEPENDENT_AMBULATORY_CARE_PROVIDER_SITE_OTHER): Payer: BC Managed Care – PPO | Admitting: Family Medicine

## 2010-07-31 ENCOUNTER — Encounter: Payer: Self-pay | Admitting: Family Medicine

## 2010-07-31 DIAGNOSIS — J0100 Acute maxillary sinusitis, unspecified: Secondary | ICD-10-CM

## 2010-07-31 DIAGNOSIS — I10 Essential (primary) hypertension: Secondary | ICD-10-CM

## 2010-07-31 DIAGNOSIS — E785 Hyperlipidemia, unspecified: Secondary | ICD-10-CM

## 2010-07-31 DIAGNOSIS — Z0000 Encounter for general adult medical examination without abnormal findings: Secondary | ICD-10-CM

## 2010-08-13 ENCOUNTER — Other Ambulatory Visit: Payer: Self-pay | Admitting: Family Medicine

## 2010-08-13 DIAGNOSIS — I10 Essential (primary) hypertension: Secondary | ICD-10-CM

## 2010-08-13 NOTE — Assessment & Plan Note (Signed)
Summary: office visit   Vital Signs:  Patient profile:   60 year old female Menstrual status:  postmenopausal Height:      67.5 inches Weight:      205.50 pounds BMI:     31.83 O2 Sat:      96 % Pulse rate:   83 / minute Pulse rhythm:   regular Resp:     16 per minute BP sitting:   120 / 80  (left arm) Cuff size:   large  Vitals Entered By: Everitt Amber LPN (July 30, 2353 3:24 PM)  Nutrition Counseling: Patient's BMI is greater than 25 and therefore counseled on weight management options. CC: Follow up chronic problems   CC:  Follow up chronic problems.  History of Present Illness: Reports  that  she has been doing well. she isnoting increased allergy symptoms n the past 2 weeks, and as spring approaches she anticipates that this will become even greater. she has in the past several days been experiencing maxillary pressure and tendrness, with increased drainage, which is still clear, she is however concerned that an infection may be brewing. Denies recent fever or chills. Denies nasal congestion , ear pain or sore throat. Denies chest congestion, or cough productive of sputum. Denies chest pain, palpitations, PND, orthopnea or leg swelling. Denies abdominal pain, nausea, vomitting, diarrhea or constipation. Denies change in bowel movements or bloody stool. Denies dchronic  joint pain, swelling, or reduced mobility. Denies headaches, vertigo, seizures. Denies depression, anxiety or insomnia. Denies  rash, lesions, or itch.  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   Current Medications (verified): 1)  Fish Oil Concentrate 1000 Mg  Caps (Omega-3 Fatty Acids) .... Take 1 Capsule By Mouth Once A Day 2)  Caltrate 600+d Plus 600-400 Mg-Unit  Tabs (Calcium Carbonate-Vit D-Min) .... Take 1 Tablet By Mouth Two Times A Day 3)  Centrum Silver   Tabs (Multiple Vitamins-Minerals) .... Take 1 Tablet By Mouth Once A Day As  Needed 4)  Aspirin Adult Low Strength 81 Mg Tbec (Aspirin) .... Take 1 Tab By Mouth Once Daily 5)  Lydia Pinkham  Tabs (Misc Natural Products) .... 2 Tabs By Mouth Once Daily 6)  Aleve 220 Mg Tabs (Naproxen Sodium) .... 2 Tabs As Needed 7)  Diovan 80 Mg Tabs (Valsartan) .... Take 1 Tab By Mouth At Bedtime 8)  Singulair 10 Mg Tabs (Montelukast Sodium) .... Take 1 Tablet By Mouth Once A Day  Allergies (verified): 1)  ! Dilantin 2)  ! Barbiturates 3)  ! * Alcohol 4)  ! Vioxx 5)  ! Vicodin 6)  ! Ceftin 7)  ! * Librium 8)  ! * Diabenese 9)  ! * Porphrisa 10)  ! * Oriviase 11)  ! * Na Pentithal  Review of Systems      See HPI General:  Denies fatigue. Eyes:  Denies discharge, eye pain, and red eye. ENT:  Complains of postnasal drainage and sinus pressure. MS:  Complains of joint pain; occasional. Endo:  Denies cold intolerance, excessive hunger, excessive thirst, and heat intolerance. Heme:  Denies abnormal bruising and bleeding. Allergy:  Complains of seasonal allergies; denies hives or rash, itching eyes, and persistent infections.  Physical Exam  General:  Well-developed,well-nourished,in no acute distress; alert,appropriate and cooperative throughout examination HEENT: No facial asymmetry,  EOMI, maxillary sinus tenderness, TM's Clear, oropharynx  pink and moist.   Chest: Clear to auscultation bilaterally.  CVS: S1, S2, No  murmurs, No S3.   Abd: Soft, Nontender.  MS: Adequate ROM spine, hips, shoulders and knees.  Ext: No edema.   CNS: CN 2-12 intact, power tone and sensation normal throughout.   Skin: Intact, no visible lesions or rashes.  Psych: Good eye contact, normal affect.  Memory intact, not anxious or depressed appearing.    Impression & Recommendations:  Problem # 1:  PHYSICAL EXAMINATION (ICD-V70.0) Assessment Deteriorated flare up during spring   Problem # 2:  ACUTE MAXILLARY SINUSITIS (ICD-461.0) Assessment: Comment Only  Her updated medication list  for this problem includes:    Flonase 50 Mcg/act Susp (Fluticasone propionate) ..... One to two puffs per nostril once daily Zpack prescribed, advised pt not to takeunless symptoms worsened, as in the development of fever or chills,green drainage  Problem # 3:  HYPERTENSION (ICD-401.9) Assessment: Unchanged  Her updated medication list for this problem includes:    Diovan 80 Mg Tabs (Valsartan) .Marland Kitchen... Take 1 tab by mouth at bedtime  Orders: T-Basic Metabolic Panel [id number])  BP today: 120/80 Prior BP: 120/80 (12/18/2009)  Labs Reviewed: K+: 4.7 (10/06/2007) Creat: : 0.95 (10/06/2007)   Chol: 228 (10/06/2007)   HDL: 48 (10/06/2007)   LDL: 154 (10/06/2007)   TG: 128 (10/06/2007)  Problem # 4:  HYPERLIPIDEMIA (ICD-272.4) Assessment: Comment Only  The following medications were removed from the medication list:    Crestor 10 Mg Tabs (Rosuvastatin calcium) .Marland Kitchen... Take 1 tab by mouth at bedtime Low fat dietdiscussed and encouraged  Orders: T-Lipid Profile ([id number]) T-Hepatic Function [id number])  Labs Reviewed: SGOT: 17 (10/07/2007)   SGPT: 19 (10/07/2007)   HDL:48 (10/06/2007)  LDL:154 (10/06/2007)  Chol:228 (10/06/2007)  Trig:128 (10/06/2007)  Complete Medication List: 1)  Fish Oil Concentrate 1000 Mg Caps (Omega-3 fatty acids) .... Take 1 capsule by mouth once a day 2)  Caltrate 600+d Plus 600-400 Mg-unit Tabs (Calcium carbonate-vit d-min) .... Take 1 tablet by mouth two times a day 3)  Centrum Silver Tabs (Multiple vitamins-minerals) .... Take 1 tablet by mouth once a day as needed 4)  Aspirin Adult Low Strength 81 Mg Tbec (Aspirin) .... Take 1 tab by mouth once daily 5)  Lydia Pinkham Tabs (Misc natural products) .... 2 tabs by mouth once daily 6)  Aleve 220 Mg Tabs (Naproxen sodium) .... 2 tabs as needed 7)  Diovan 80 Mg Tabs (Valsartan) .... Take 1 tab by mouth at bedtime 8)  Flonase 50 Mcg/act Susp (Fluticasone propionate) .... One to two puffs per nostril once  daily  Other Orders: T-CBC w/Diff [id number]) T-TSH [phone]) T-Vitamin D (25-Hydroxy) [phone])  Patient Instructions: 1)  CPE 12/19/2010  or after. 2)  It is important that you exercise regularly at least 20 minutes 5 times a week. If you develop chest pain, have severe difficulty breathing, or feel very tired , stop exercising immediately and seek medical attention. 3)  You need to lose weight. Consider a lower calorie diet and regular exercise.  4)  BMP prior to visit, ICD-9: 5)  Hepatic Panel prior to visit, ICD-9: 6)  Lipid Panel prior to visit, ICD-9:  fasting asap 7)  TSH prior to visit, ICD-9:  fasting asap 8)  CBC w/ Diff prior to visit, ICD-9:, vitamin d 9)  meds sent in for sinusitis and allergies Prescriptions: ZITHROMAX Z-PAK 250 MG TABS (AZITHROMYCIN) Use as directed  #6 x 0   Entered and Authorized by:   Syliva Overman MD   Signed by:   Syliva Overman MD on 07/31/2010  Method used:   Electronically to        [facility]  [address] (retail)       [address]       [place], [place]  [address]       Ph: [phone]       Fax: [phone]   RxID:   [id number] Aleda Grana 50 MCG/ACT SUSP (FLUTICASONE PROPIONATE) one to two puffs per nostril once daily  #1 x 3   Entered and Authorized by:   Syliva Overman MD   Signed by:   Syliva Overman MD on 07/31/2010   Method used:   Electronically to        [facility]  [address] (retail)       [address]       [place], [place]  [address]       Ph: [phone]       Fax: [phone]   RxID:   [id number]    Orders Added: 1)  Est. Patient Level IV [55732] 2)  T-Basic Metabolic Panel [[id number]] 3)  T-Lipid Profile [80061-22930] 4)  T-Hepatic Function [80076-22960] 5)  T-CBC w/Diff [[id number]] 6)  T-TSH [[id number]] 7)  T-Vitamin D (25-Hydroxy) [26948-54627]

## 2010-10-11 NOTE — Consult Note (Signed)
[facility]  Patient:    [name], [name] Visit Number: [id number] MRN: [id number]          Service Type: RHE Location: [facility] Attending Physician:  Aundra Dubin Dictated by:   Nathaneil Canary, M.D. Proc. Date: 10/21/01 Admit Date:  10/21/2001   CC:         Mirna Mires, M.D.   Consultation Report  CHIEF COMPLAINT:  Polyarthritis.  HISTORY OF PRESENT ILLNESS:  [name] has noted some darkening skin to the sides of her face under the eyes and on her forehead.  She has felt that this could be related to the Plaquenil and only recently decreased to one tablet a day.  She did stop the prednisone in late March.  She is having little arthritic pain to the hands and wrists.  Her main arthralgia is in the right foot.  She has known bunions and she feels that the bones of the foot have shifted and she hurts under the metatarsals.  Her weight is stable.  There has been no URIs, fevers, cough, nausea, or stomatitis.  CURRENT MEDICATIONS: 1. Plaquenil 200 mg q. day. 2. Allegra p.r.n. 3. Melanex p.r.n. 4. Ibuprofen p.r.n. 5. Black cohosh.  PHYSICAL EXAMINATION:  VITAL SIGNS:  Weight 191 pounds, blood pressure 100/66, respirations 16.  GENERAL:  She appears well.  SKIN:  There is some moderate hyperpigmentation under the eyes and to the sides of the forehead.  On the forehead, she has a similar hyperpigmented area.  MUSCULOSKELETAL:  Hands, wrists, elbows, shoulders, knees, ankles, and left foot have a good range of motion and are nontender.  The right foot shows a more pronounced first MTP bunion and she is tender across the MTPs.  ASSESSMENT/PLAN: 1. Polyarthritis.  She will continue on Plaquenil 200 mg q. day.  Overall, the    arthritis is very stable as it has done in the past. 2. Facial discoloration.  This could be related to the Plaquenil.  I have    asked her to take Plaquenil every other day for two weeks and she will    continue on one  q.d.  If she feels that this is not adequate, she could    stop the medicine completely for about two weeks and then resume. 3. Bunion.  She has worked with Dr. Pricilla Holm in the past and he can probably    help her better than I can.  She is already using a wider shoe and has been    padding the foot.  She will return in six months. Dictated by:   Nathaneil Canary, M.D. Attending Physician:  Aundra Dubin DD:  10/21/01 TD:  10/22/01 Job: [id number]

## 2010-10-11 NOTE — Consult Note (Signed)
[facility]  Patient:    MAIANA, HENNIGAN Visit Number: [id number] MRN: [id number]          Service Type: RHE Location: SPCL Attending Physician:  Aundra Dubin Dictated by:   Aundra Dubin, M.D. Proc. Date: 03/18/01 Admit Date:  03/18/2001   CC:         Mirna Mires, M.D.   Consultation Report  CHIEF COMPLAINT:  Polyarthritis.  HISTORY OF PRESENT ILLNESS:  Ms. Nichelson returns reporting that she has had achiness in the wrists, and this has worsened some with the cool weather.  She has not lowered the prednisone as planned back in the summer and is still on 5 mg a day.  Her weight is down 7 pounds, and she has been cutting back on calories.  She is overall feeling well at this time.  She did have her eyes examined by Dr. Lita Mains and is perfectly fine with the Plaquenil presently. There has been no nausea, headaches, or rashes with Plaquenil.  MEDICATIONS: 1. Prednisone 5 mg q.d. 2. Plaquenil 600 mg q.d. 3. Ibuprofen or Aleve, rare. 4. Calcium 2 to 3 per month. 5. Black Cohash.  PHYSICAL EXAMINATION:  VITAL SIGNS:  Weight 189 pounds, blood pressure 118/70, respirations 16, pulse 88.  GENERAL:  No distress.  SKIN:  Clear.  LUNGS:  Clear.  HEART:  Regular without murmur.  EXTREMITIES:  Lower extremities no edema.  MUSCULOSKELETAL:  Hands and wrists have a minor degree of fullness but are completely cool and nontender.  Elbows, shoulders, knees, ankles, and feet have a good range of motion and show no active arthritis.  ASSESSMENT/PLAN: 1. Polyarthritis.  She may have mild rheumatoid arthritis.  We will continue    her on Plaquenil 400 mg q.d.  Concerning the prednisone, she has about    10 tablets left.  She will lower to 2.5 mg a day; and as these run out,    she will be off of the prednisone.  She can use Aleve or Advil. 2. Mild increased alanine aminotransferase (ALT).  Her ALT was 44 on    Oct 08, 2000.  We will check  this again.  She will return in three months. Dictated by:   Aundra Dubin, M.D. Attending Physician:  Aundra Dubin DD:  03/18/01 TD:  03/19/01 Job: [id number]

## 2010-10-11 NOTE — H&P (Signed)
[name], [name]               ACCOUNT NO.:  [id number]   MEDICAL RECORD NO.:  [id number]          PATIENT TYPE:  AMB   LOCATION:  DAY                           FACILITY:  [facility]   PHYSICIAN:  [name], M.D.   DATE OF BIRTH:  May 29, 1950   DATE OF ADMISSION:  DATE OF DISCHARGE:  LH                                HISTORY & PHYSICAL   HISTORY OF PRESENT ILLNESS:  A 60 year old female referred for colonoscopy.  She has a prior history of guaiac positive stools.  Recent stool guaiacs  were negative.  Negative family history of colon polyps, but sister has  ulcerative colitis.   PAST HISTORY:  1.  Positive for hypertension.  2.  Porphyria.  3.  Kidney stones.   MEDICATIONS:  1.  Hydrochlorothiazide.  2.  Potassium chloride 10 mEq daily.  3.  Aspirin 81 mg daily.  4.  Caltrate 600 daily.  5.  Multivitamins.  6.  Fish oil.   ALLERGIES:  1.  BARBITURATES.  2.  SODIUM PENTOTHAL.  3.  CEPHALOSPORINS.  4.  LIBRIUM.  5.  DILANTIN.  6.  ALCOHOL.  7.  VIOXX.   PHYSICAL EXAMINATION:  VITAL SIGNS:  Blood pressure 126/90, pulse 92,  respirations 18.  Weight 193 pounds.  HEENT:  Unremarkable.  NECK:  Supple.  No JVD, bruit, adenopathy, or thyromegaly.  CHEST:  Clear to auscultation.  No rales, rubs, rhonchi, or wheezes.  HEART:  Regular rate and rhythm without murmur, gallop, or rub.  ABDOMEN:  Soft, nontender.  No masses.  EXTREMITIES:  No clubbing, cyanosis, or edema.  NEUROLOGIC:  No focal motor, sensory, or cerebellar deficit.   IMPRESSION:  1.  Need for screening colonoscopy.  2.  Hypertension.  3.  Kidney stones.  4.  History of porphyria.   PLAN:  Screening colonoscopy.      LCS/MEDQ  D:  10/09/2004  T:  10/10/2004  Job:  [id number]

## 2010-10-11 NOTE — Consult Note (Signed)
[facility]  Patient:    [name], [name] Visit Number: [id number] MRN: [id number]          Service Type: RHE Location: SPCL Attending Physician:  Aundra Dubin Dictated by:   Nathaneil Canary, M.D. Proc. Date: 06/17/01 Admit Date:  06/17/2001   CC:         Mirna Mires, M.D.   Consultation Report  CHIEF COMPLAINT:  Polyarthritis.  HISTORY:  Ms. Welsch has recently injured her right ankle while going up the steps.  She heard a slight pop and there was a bruise a day or so later.  This has gradually improved.  She is using elastic support to help with this along with Aleve.  Only the right second DIP is somewhat achy.  Overall, she is doing well.  She has not completely stopped the prednisone, but is on a very low amount.  Her weight is stable, but is up 2 pounds.  There have been no URIs, fever, cough, stomatitis.  She denies back pain.  MEDICATIONS: 1. Prednisone 2.5 mg q.o.d. 2. Plaquenil 400 mg q.d. 3. Ibuprofen p.r.n. 4. Aleve p.r.n. 5. Vitamins.  PHYSICAL EXAMINATION  VITAL SIGNS:  Weight 191 pounds, blood pressure 127/85, respirations 16.  GENERAL:  She is healthy appearing.  LUNGS:  Clear.  HEART:  Regular, no murmur.  NECK:  Negative JVD.  SKIN:  Clear.  EXTREMITIES:  Lower extremities:  No edema.  MUSCULOSKELETAL:  The right second DIP is slightly swollen compared to the left.  Otherwise, the MCPs and PIPs have no active swelling and are cool and nontender.  Wrists, elbows, shoulders, neck, hips, knees, left ankle, and bilateral feet have a good range of motion and are nontender.  She has mild tenderness above the medial malleolus and inverting the foot can cause some slight discomfort.  ASSESSMENT AND PLAN: 1. Polyarthritis.  She continues to have a mild arthritic presentation.  I    will allow her to continue with the prednisone at 2.5 mg q.d. and    occasional 5 mg as needed.  In March she will try to lower and stop  the    medicine.  Her arthritis overall is mild.  She is now showing some swelling    to the right index DIP.  Possibly this is more of an osteoarthritis type of    arthritis. 2. Right ankle strain.  This is improving.  There is no need to x-ray it.  She will return in four months. Dictated by:   Nathaneil Canary, M.D. Attending Physician:  Aundra Dubin DD:  06/17/01 TD:  06/18/01 Job: [id number]

## 2010-10-11 NOTE — Consult Note (Signed)
[name], [name] NO.:  [id number]   MEDICAL RECORD NO.:  [id number]                  PATIENT TYPE:   LOCATION:                                       FACILITY:   PHYSICIAN:  Aundra Dubin, M.D.            DATE OF BIRTH:   DATE OF CONSULTATION:  07/14/2002  DATE OF DISCHARGE:                                   CONSULTATION   CHIEF COMPLAINT:  Polyarthritis.   HISTORY OF PRESENT ILLNESS:  The patient is a 60 year old woman who has mild  episodic polyarthritis.  I have not seen her since Oct 21, 2001, and she has  lowered the Plaquenil but has stayed on it.  She takes 1 tablet about every  other day or three times a week.  She took no Plaquenil from about August to  early October when she had a significant URI.  She has had to be on  antibiotics twice.  She is not hurting in her hands and wrists.  Her main  area of arthralgia is the left ankle, which is quite stiff.  Occasionally it  is uncomfortable but not swollen.  Overall, she has been doing fairly well.  Her grandfather died in 2023-04-12.  Her weight is up four pounds.  There has  been no polyuria or polydipsia.   MEDICATIONS:  1. Aleve p.r.n.  2. Allegra.  3. Calcium rare.  4. Off prednisone.  5. Ibuprofen 200 mg daily.  6. Plaquenil 200 mg every other day.  7. Lydia pinkhan 2 daily.   PHYSICAL EXAMINATION:  VITAL SIGNS:  Weight 195 pounds.  Blood pressure  110/80, respirations 18.  GENERAL:  In no distress.  LUNGS:  Clear.  HEART:  Regular.  No murmur.  EXTREMITIES:  Lower extremities with no edema.  MUSCULOSKELETAL:  The hands, wrists, elbows, shoulders, neck, hips, knees,  and right ankle and feet have no swelling and are nontender.  The left ankle  is mildly tender.  She is more tender along the Achilles area.  There is no  swelling.   ASSESSMENT AND PLAN:  1. Mild polyarthritis:  I will continue her at the present time on 200 mg     Plaquenil every other day.  I see no  active arthritis.  She occasionally     needs prednisone but none in the last six months.  2.     Left ankle pain:  I believe this is a tendinitis or ligament-type strain.     We have discussed stretching it and the possible use of an elastic     support.   FOLLOW-UP:  She will return in six months.  Aundra Dubin, M.D.    WWT/MEDQ  D:  07/14/2002  T:  07/14/2002  Job:  [id number]   cc:   Ramon Dredge L. Juanetta Gosling, M.D.  [address]  [place]  [place] [address]  Fax: [phone]

## 2010-11-21 ENCOUNTER — Ambulatory Visit (INDEPENDENT_AMBULATORY_CARE_PROVIDER_SITE_OTHER): Payer: BC Managed Care – PPO | Admitting: Family Medicine

## 2010-11-21 VITALS — BP 120/80 | HR 74 | Resp 16 | Ht 67.5 in | Wt 205.0 lb

## 2010-11-21 DIAGNOSIS — J9801 Acute bronchospasm: Secondary | ICD-10-CM | POA: Insufficient documentation

## 2010-11-21 DIAGNOSIS — H1032 Unspecified acute conjunctivitis, left eye: Secondary | ICD-10-CM | POA: Insufficient documentation

## 2010-11-21 DIAGNOSIS — R5381 Other malaise: Secondary | ICD-10-CM

## 2010-11-21 DIAGNOSIS — Z139 Encounter for screening, unspecified: Secondary | ICD-10-CM

## 2010-11-21 DIAGNOSIS — H1030 Unspecified acute conjunctivitis, unspecified eye: Secondary | ICD-10-CM

## 2010-11-21 DIAGNOSIS — E785 Hyperlipidemia, unspecified: Secondary | ICD-10-CM

## 2010-11-21 DIAGNOSIS — I10 Essential (primary) hypertension: Secondary | ICD-10-CM

## 2010-11-21 DIAGNOSIS — R5383 Other fatigue: Secondary | ICD-10-CM

## 2010-11-21 MED ORDER — GENTAMICIN SULFATE 0.3 % OP SOLN: 1.0000 [drp] | Freq: Three times a day (TID) | OPHTHALMIC | Status: AC

## 2010-11-21 MED ORDER — PREDNISONE (PAK) 5 MG PO TABS: 5.0000 mg | ORAL_TABLET | ORAL | Status: DC

## 2010-11-21 MED ORDER — METHYLPREDNISOLONE ACETATE 80 MG/ML IJ SUSP
80.0000 mg | Freq: Once | INTRAMUSCULAR | Status: AC
  Administered 2010-11-21: 80 mg via INTRAMUSCULAR

## 2010-11-21 MED ORDER — FLUTICASONE PROPIONATE 50 MCG/ACT NA SUSP: 1.0000 | Freq: Every day | NASAL | Status: DC

## 2010-11-21 MED ORDER — IPRATROPIUM BROMIDE 0.02 % IN SOLN
0.5000 mg | RESPIRATORY_TRACT | Status: AC
  Administered 2010-11-21: 0.5 mg via RESPIRATORY_TRACT

## 2010-11-21 MED ORDER — ALBUTEROL SULFATE (2.5 MG/3ML) 0.083% IN NEBU
2.5000 mg | INHALATION_SOLUTION | RESPIRATORY_TRACT | Status: AC
  Administered 2010-11-21: 2.5 mg via RESPIRATORY_TRACT

## 2010-11-21 MED ORDER — ALBUTEROL SULFATE HFA 108 (90 BASE) MCG/ACT IN AERS: 2.0000 | INHALATION_SPRAY | Freq: Four times a day (QID) | RESPIRATORY_TRACT | Status: DC | PRN

## 2010-11-21 NOTE — Progress Notes (Signed)
Was given a shot of depo medrol and breathing treatment and also a prednisone pack was sent in for her as well

## 2010-11-21 NOTE — Patient Instructions (Signed)
F/u as before.  You have been treated for acute bronchospasm aggravated by exposure to paint, PLEASE DO NOT GO BACK INTO THAT eNVIRONMENT

## 2010-11-24 ENCOUNTER — Encounter: Payer: Self-pay | Admitting: Family Medicine

## 2010-11-24 NOTE — Assessment & Plan Note (Signed)
Acute respiratory distress, neb treatment administered, also depomedrol with excellent response, inhaler and dose pack also prescribed

## 2010-11-24 NOTE — Assessment & Plan Note (Signed)
Updated labs needed.

## 2010-11-24 NOTE — Progress Notes (Signed)
  Subjective:    Patient ID: [name], female    DOB: November 29, 1950, 60 y.o.   MRN: [id number]  HPI Pt walked in stating she was having increasing difficulty breathing due to repeated exposure to "fresh pain" while on the job. This has happened in the past. She does have significant allergies also She has also started experiencing a gritty feeling in the right eye, with mild redness since last night, thinks it may have been due to the excessive coughing which she has been doing. Denies any fever or purulent drainage , or sputum       Review of Systems Denies recent fever or chills. Denies sinus pressure, nasal congestion, ear pain or sore throat. Denies chest congestion or  productive cough  Denies chest pains, palpitations, paroxysmal nocturnal dyspnea, orthopnea and leg swelling Denies abdominal pain, nausea, vomiting,diarrhea or constipation.   Denies headaches, seizure, numbness, or tingling. Denies depression, anxiety or insomnia. Denies skin break down or rash.        Objective:   Physical Exam Patient alert and oriented and in mild to moderate  Cardiopulmonary distress.Initially tachypneic with reduced air entry an wheezing  HEENT: No facial asymmetry, EOMI, no sinus tenderness, TM's clear, Oropharynx pink and moist.  Neck supple no adenopathy.  Chest: Decreased air entry, bilateral wheezes, which cleared after neb treatment.  CVS: S1, S2 no murmurs, no S3.  ABD: Soft non tender. Bowel sounds normal.  Ext: No edema  MS: Adequate ROM spine, shoulders, hips and knees.  Skin: Intact, no ulcerations or rash noted.  Psych: Good eye contact, normal affect. Memory intact not anxious or depressed appearing.  CNS: CN 2-12 intact, power, tone and sensation normal throughout.        Assessment & Plan:

## 2010-11-24 NOTE — Assessment & Plan Note (Signed)
Gentamycin eye drops prescribed, pt counseled not to rub the eye

## 2010-11-24 NOTE — Assessment & Plan Note (Signed)
Controlled, no change in medication  

## 2010-11-25 NOTE — Progress Notes (Signed)
Addended by: Abner Greenspan on: 11/25/2010 04:31 PM   Modules accepted: Orders

## 2010-12-18 ENCOUNTER — Encounter: Payer: Self-pay | Admitting: Family Medicine

## 2010-12-19 ENCOUNTER — Other Ambulatory Visit: Payer: Self-pay | Admitting: Family Medicine

## 2010-12-19 LAB — CBC WITH DIFFERENTIAL/PLATELET
Lymphocytes Relative: 33 % (ref 12–46)
Lymphs Abs: 3.2 10*3/uL (ref 0.7–4.0)
MCV: 86.1 fL (ref 78.0–100.0)
Neutro Abs: 5.7 10*3/uL (ref 1.7–7.7)
Neutrophils Relative %: 59 % (ref 43–77)
Platelets: 299 10*3/uL (ref 150–400)
RBC: 5.12 MIL/uL — ABNORMAL HIGH (ref 3.87–5.11)
WBC: 9.7 10*3/uL (ref 4.0–10.5)

## 2010-12-19 LAB — HEPATIC FUNCTION PANEL
AST: 17 U/L (ref 0–37)
Albumin: 4 g/dL (ref 3.5–5.2)
Total Bilirubin: 0.4 mg/dL (ref 0.3–1.2)
Total Protein: 7.2 g/dL (ref 6.0–8.3)

## 2010-12-19 LAB — TSH: TSH: 1.766 u[IU]/mL (ref 0.350–4.500)

## 2010-12-19 LAB — LIPID PANEL
HDL: 55 mg/dL (ref >39)
LDL Cholesterol: 167 mg/dL — ABNORMAL HIGH (ref 0–99)
Total CHOL/HDL Ratio: 4.5 Ratio
Triglycerides: 140 mg/dL (ref <150)

## 2010-12-19 LAB — BASIC METABOLIC PANEL
CO2: 26 mEq/L (ref 19–32)
Chloride: 104 mEq/L (ref 96–112)
Potassium: 4.7 mEq/L (ref 3.5–5.3)
Sodium: 141 mEq/L (ref 135–145)

## 2010-12-20 ENCOUNTER — Encounter: Payer: Self-pay | Admitting: Family Medicine

## 2010-12-20 ENCOUNTER — Ambulatory Visit (INDEPENDENT_AMBULATORY_CARE_PROVIDER_SITE_OTHER): Payer: BC Managed Care – PPO | Admitting: Family Medicine

## 2010-12-20 ENCOUNTER — Other Ambulatory Visit (HOSPITAL_COMMUNITY)
Admission: RE | Admit: 2010-12-20 | Discharge: 2010-12-20 | Disposition: A | Discharge status: Home / Self Care | Payer: BC Managed Care – PPO | Source: Ambulatory Visit | Attending: Family Medicine | Admitting: Family Medicine

## 2010-12-20 VITALS — BP 120/80 | HR 82 | Resp 16 | Ht 67.0 in | Wt 204.1 lb

## 2010-12-20 DIAGNOSIS — E785 Hyperlipidemia, unspecified: Secondary | ICD-10-CM

## 2010-12-20 DIAGNOSIS — Z124 Encounter for screening for malignant neoplasm of cervix: Secondary | ICD-10-CM

## 2010-12-20 DIAGNOSIS — Z0000 Encounter for general adult medical examination without abnormal findings: Secondary | ICD-10-CM

## 2010-12-20 DIAGNOSIS — I10 Essential (primary) hypertension: Secondary | ICD-10-CM

## 2010-12-20 DIAGNOSIS — Z13820 Encounter for screening for osteoporosis: Secondary | ICD-10-CM

## 2010-12-20 DIAGNOSIS — Z01419 Encounter for gynecological examination (general) (routine) without abnormal findings: Secondary | ICD-10-CM | POA: Insufficient documentation

## 2010-12-20 DIAGNOSIS — Z1211 Encounter for screening for malignant neoplasm of colon: Secondary | ICD-10-CM

## 2010-12-20 LAB — POC HEMOCCULT BLD/STL (OFFICE/1-CARD/DIAGNOSTIC): Fecal Occult Blood, POC: NEGATIVE

## 2010-12-20 MED ORDER — ROSUVASTATIN CALCIUM 10 MG PO TABS: 10.0000 mg | ORAL_TABLET | Freq: Every day | ORAL | Status: DC

## 2010-12-20 NOTE — Patient Instructions (Signed)
CPE in 12 months  It is important that you exercise regularly at least 30 minutes 5 times a week. If you develop chest pain, have severe difficulty breathing, or feel very tired, stop exercising immediately and seek medical attention    A healthy diet is rich in fruit, vegetables and whole grains. Poultry fish, nuts and beans are a healthy choice for protein rather then red meat. A low sodium diet and drinking 64 ounces of water daily is generally recommended. Oils and sweet should be limited. Carbohydrates especially for those who are diabetic or overweight, should be limited to 34-45 gram per meal. It is important to eat on a regular schedule, at least 3 times daily. Snacks should be primarily fruits, vegetables or nuts.   Fasting lipid, hepatic , chem7  And Vit d in 4 months  pls ensure you eat calcium enrched foods. pls take vit D 800IU to 1000IU (OTC once daily), your vit D is low

## 2010-12-20 NOTE — Progress Notes (Signed)
  Subjective:    Patient ID: [name], female    DOB: 1951-01-07, 60 y.o.   MRN: [id number]  HPI The PT is here for annual exam and re-evaluation of chronic medical conditions, medication management and review of any available recent lab and radiology data.  Preventive health is updated, specifically  Cancer screening and Immunization.   Questions or concerns regarding consultations or procedures which the PT has had in the interim are  addressed. The PT denies any adverse reactions to current medications since the last visit.  There are no new concerns.  There are no specific complaints . She recently since April had a lot of stress due to ill health of her mother who has now significantly improved. She has a healed lesion on her lower back which tends to "break out" under stressful conditions, topical neosporin is all she has ever used.      Review of Systems Denies recent fever or chills. Denies sinus pressure, nasal congestion, ear pain or sore throat. Denies chest congestion, productive cough or wheezing.succesful resolution of acute bronchospasm following paint exposure. Denies chest pains, palpitations and leg swelling Denies abdominal pain, nausea, vomiting,diarrhea or constipation.   Denies dysuria, frequency, hesitancy or incontinence. Denies joint pain, swelling and limitation in mobility.Improved right ankle pain Denies headaches, seizures, numbness, or tingling. Denies depression, anxiety or insomnia.       Objective:   Physical Exam Pleasant well nourished female, alert and oriented x 3, in no cardio-pulmonary distress. Afebrile. HEENT No facial trauma or asymetry. Sinuses non tender.  EOMI, PERTL, fundoscopic exam is normal, no hemorhage or exudate.  External ears normal, tympanic membranes clear. Oropharynx moist, no exudate, good dentition. Neck: supple, no adenopathy,JVD or thyromegaly.No bruits.  Chest: Clear to ascultation bilaterally.No crackles or  wheezes. Non tender to palpation  Breast: No asymetry,no masses. No nipple discharge or inversion. No axillary or supraclavicular adenopathy  Cardiovascular system; Heart sounds normal,  S1 and  S2 ,no S3.  No murmur, or thrill. Apical beat not displaced Peripheral pulses normal.  Abdomen: Soft, non tender, no organomegaly or masses. No bruits. Bowel sounds normal. No guarding, tenderness or rebound.  Rectal:  No mass. Guaiac negative stool.  GU: External genitalia normal. No lesions. Vaginal canal normal.No discharge. Uterus normal size, no adnexal masses, no cervical motion or adnexal tenderness.  Musculoskeletal exam: Full ROM of spine, hips , shoulders and knees. No deformity ,swelling or crepitus noted. No muscle wasting or atrophy.   Neurologic: Cranial nerves 2 to 12 intact. Power, tone ,sensation and reflexes normal throughout. No disturbance in gait. No tremor.  Skin: Intact, no ulceration or  erythema  Noted.hyperpigmented area where rash is healing on lower back  Psych; Normal mood and affect. Judgement and concentration normal        Assessment & Plan:   No problem-specific assessment & plan notes found for this encounter.

## 2010-12-20 NOTE — Assessment & Plan Note (Signed)
Controlled, no change in medication  

## 2010-12-20 NOTE — Assessment & Plan Note (Signed)
Uncontrolled. Medication compliance addressed. Commitment to regular exercise, and healthy  eating habits with portion control discussed. DASH diet, and low fat diet discussed, and literature offered. No changes in medication at this time.  

## 2011-02-10 ENCOUNTER — Encounter: Payer: Self-pay | Admitting: Family Medicine

## 2011-02-10 ENCOUNTER — Ambulatory Visit (INDEPENDENT_AMBULATORY_CARE_PROVIDER_SITE_OTHER): Payer: BC Managed Care – PPO | Admitting: Family Medicine

## 2011-02-10 DIAGNOSIS — E785 Hyperlipidemia, unspecified: Secondary | ICD-10-CM

## 2011-02-10 DIAGNOSIS — J9801 Acute bronchospasm: Secondary | ICD-10-CM

## 2011-02-10 DIAGNOSIS — I10 Essential (primary) hypertension: Secondary | ICD-10-CM

## 2011-02-10 DIAGNOSIS — J0100 Acute maxillary sinusitis, unspecified: Secondary | ICD-10-CM

## 2011-02-10 DIAGNOSIS — J209 Acute bronchitis, unspecified: Secondary | ICD-10-CM

## 2011-02-10 MED ORDER — ALBUTEROL SULFATE HFA 108 (90 BASE) MCG/ACT IN AERS: 2.0000 | INHALATION_SPRAY | Freq: Four times a day (QID) | RESPIRATORY_TRACT | Status: DC | PRN

## 2011-02-10 MED ORDER — CHLORPHENIRAMINE-HYDROCODONE 8-10 MG/5ML PO LQCR: 5.0000 mL | Freq: Two times a day (BID) | ORAL | Status: DC | PRN

## 2011-02-10 MED ORDER — METHYLPREDNISOLONE ACETATE 80 MG/ML IJ SUSP
80.0000 mg | Freq: Once | INTRAMUSCULAR | Status: AC
  Administered 2011-02-10: 80 mg via INTRAMUSCULAR

## 2011-02-10 MED ORDER — PREDNISONE (PAK) 5 MG PO TABS: 5.0000 mg | ORAL_TABLET | ORAL | Status: DC

## 2011-02-10 MED ORDER — IPRATROPIUM BROMIDE 0.02 % IN SOLN
0.5000 mg | Freq: Once | RESPIRATORY_TRACT | Status: AC
  Administered 2011-02-10: 0.5 mg via RESPIRATORY_TRACT

## 2011-02-10 MED ORDER — MONTELUKAST SODIUM 10 MG PO TABS: 10.0000 mg | ORAL_TABLET | Freq: Every day | ORAL | Status: DC

## 2011-02-10 MED ORDER — ALBUTEROL SULFATE (2.5 MG/3ML) 0.083% IN NEBU
2.5000 mg | INHALATION_SOLUTION | Freq: Once | RESPIRATORY_TRACT | Status: AC
  Administered 2011-02-10: 2.5 mg via RESPIRATORY_TRACT

## 2011-02-10 MED ORDER — DOXYCYCLINE HYCLATE 100 MG PO TABS: 100.0000 mg | ORAL_TABLET | Freq: Two times a day (BID) | ORAL | Status: AC

## 2011-02-10 NOTE — Patient Instructions (Addendum)
F/u as befpore.  Pleas remember your blood work.  You are being treated for acute sinusitis and bronchitis, you will get a breathing treatment and a depo medrol injection. Doxycycline, prednisone dose pack , tussionex and proventil a re sent to your pharmacy.  Pls call if not getting better

## 2011-02-10 NOTE — Assessment & Plan Note (Signed)
Controlled, no change in medication  

## 2011-02-10 NOTE — Progress Notes (Signed)
  Subjective:    Patient ID: [name], female    DOB: 04-Feb-1951, 60 y.o.   MRN: [id number]  HPI 4 day h/o head and chest congestion , chills, facial and ear pressure, laryngitis and cough which is at times productive of yellow sputum. Has had some result from ventolin as far as wheezing and chest tightness is concerned  Review of Systems See HPI  Denies chest pains, palpitations and leg swelling Denies abdominal pain, nausea, vomiting,diarrhea or constipation.   Denies dysuria, frequency, hesitancy or incontinence. Denies joint pain, swelling and limitation in mobility. Denies  seizures, numbness, or tingling.Headache from sinus pressure, poor sleep and cough Denies depression, anxiety or insomnia. Denies skin break down or rash.        Objective:   Physical Exam Patient alert and oriented and in no cardiopulmonary distress.Ill appearing  HEENT: No facial asymmetry, EOMI, maxillary  sinus tenderness,  oropharynx pink and moist.  Neck supple no adenopathy.  Chest: decreased air entry, bilateral crackles and wheezes  CVS: S1, S2 no murmurs, no S3.  ABD: Soft non tender. Bowel sounds normal.  Ext: No edema  MS: Adequate ROM spine, shoulders, hips and knees.  Skin: Intact, no ulcerations or rash noted.  Psych: Good eye contact, normal affect. Memory intact not anxious or depressed appearing.  CNS: CN 2-12 intact, power, tone and sensation normal throughout.        Assessment & Plan:

## 2011-02-10 NOTE — Assessment & Plan Note (Signed)
Acute episode, antibiotics , decongestant and steroids prescribed. Neb treatment administered in office also

## 2011-02-10 NOTE — Assessment & Plan Note (Signed)
Continue med, and labs when due in the next 2 months

## 2011-02-10 NOTE — Assessment & Plan Note (Signed)
Antibiotics prescribed, pt counseled to take the entire course

## 2011-02-20 ENCOUNTER — Other Ambulatory Visit: Payer: Self-pay | Admitting: Family Medicine

## 2011-02-20 ENCOUNTER — Telehealth: Payer: Self-pay | Admitting: Family Medicine

## 2011-02-20 NOTE — Telephone Encounter (Signed)
Treatment of hoarseness, voice rest, if painful then erx dukes magic mouthwash 10cc three times daily gargle , swish and swallow x 300cc only. She has already taken a prednisone dose pack. If painless hoarseness persists will need ENT to look at her vocal cords. Pls give me any feeedback i need to hear

## 2011-02-20 NOTE — Telephone Encounter (Signed)
She is feeling much better, sinuses/chest/cough all better. But she is really hoarse. Has been that was for 10 days. Don't know if she strained her voice or if there is something you can give her for it. 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 [employer]

## 2011-02-21 ENCOUNTER — Encounter: Payer: Self-pay | Admitting: Family Medicine

## 2011-02-21 ENCOUNTER — Ambulatory Visit (INDEPENDENT_AMBULATORY_CARE_PROVIDER_SITE_OTHER): Payer: BC Managed Care – PPO | Admitting: Family Medicine

## 2011-02-21 VITALS — BP 130/84 | HR 76 | Resp 14 | Ht 67.0 in | Wt 208.0 lb

## 2011-02-21 DIAGNOSIS — E785 Hyperlipidemia, unspecified: Secondary | ICD-10-CM

## 2011-02-21 DIAGNOSIS — J040 Acute laryngitis: Secondary | ICD-10-CM | POA: Insufficient documentation

## 2011-02-21 DIAGNOSIS — I10 Essential (primary) hypertension: Secondary | ICD-10-CM

## 2011-02-21 MED ORDER — VALSARTAN 80 MG PO TABS: 80.0000 mg | ORAL_TABLET | Freq: Every day | ORAL | Status: DC

## 2011-02-21 NOTE — Patient Instructions (Addendum)
F/u as before.  You have chronic laryngitis.  Voice rest is required.  Rept prednisone dose pack is being prescribed, and you are referred to ENT for further evaluation.  Work excuse from 09/27 to return 03/03/2011

## 2011-02-21 NOTE — Assessment & Plan Note (Signed)
Hyperlipidemia:Low fat diet discussed and encouraged.  Fasting labs prior to next visit.Continue current med

## 2011-02-21 NOTE — Assessment & Plan Note (Signed)
Controlled, no change in medication  

## 2011-02-21 NOTE — Telephone Encounter (Signed)
Pt seen in the office today and issues addressed

## 2011-02-21 NOTE — Assessment & Plan Note (Signed)
Deterioration, prednisone dose pack prescribed, and pt referred to ENT, also work excuse

## 2011-02-21 NOTE — Progress Notes (Signed)
  Subjective:    Patient ID: [name], female    DOB: 05/24/1951, 60 y.o.   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 Sputum is clear, no fever or chills  Review of Systems See HPI Denies chest pains, palpitations and leg swelling Denies abdominal pain, nausea, vomiting,diarrhea or constipation.   Denies dysuria, frequency, hesitancy or incontinence. Denies joint pain, swelling and limitation in mobility. Denies headaches, seizures, numbness, or tingling. Denies depression, anxiety or insomnia. Denies skin break down or rash.        Objective:   Physical Exam Patient alert and oriented and in no cardiopulmonary distress.  HEENT: No facial asymmetry, EOMI, no sinus tenderness,  oropharynx pink and moist.  Neck supple no adenopathy.  Chest: Adequate air entry throughout, no wheezes or crackles CVS: S1, S2 no murmurs, no S3.  ABD: Soft non tender. Bowel sounds normal.  Ext: No edema  MS: Adequate ROM spine, shoulders, hips and knees.  Skin: Intact, no ulcerations or rash noted.  Psych: Good eye contact, normal affect. Memory intact not anxious or depressed appearing.  CNS: CN 2-12 intact, power, tone and sensation normal throughout.        Assessment & Plan:

## 2011-02-27 ENCOUNTER — Telehealth: Payer: Self-pay | Admitting: Family Medicine

## 2011-02-27 ENCOUNTER — Ambulatory Visit (INDEPENDENT_AMBULATORY_CARE_PROVIDER_SITE_OTHER): Payer: BC Managed Care – PPO | Admitting: Otolaryngology

## 2011-02-27 DIAGNOSIS — K219 Gastro-esophageal reflux disease without esophagitis: Secondary | ICD-10-CM

## 2011-02-27 DIAGNOSIS — R490 Dysphonia: Secondary | ICD-10-CM

## 2011-02-27 LAB — STREP A DNA PROBE: Group A Strep Probe: NEGATIVE

## 2011-03-03 NOTE — Telephone Encounter (Signed)
Noted thanks °

## 2011-05-30 ENCOUNTER — Other Ambulatory Visit: Payer: Self-pay | Admitting: Family Medicine

## 2011-05-30 DIAGNOSIS — Z139 Encounter for screening, unspecified: Secondary | ICD-10-CM

## 2011-06-03 ENCOUNTER — Ambulatory Visit (HOSPITAL_COMMUNITY)
Admission: RE | Admit: 2011-06-03 | Discharge: 2011-06-03 | Disposition: A | Discharge status: Home / Self Care | Payer: BC Managed Care – PPO | Source: Ambulatory Visit | Attending: Family Medicine | Admitting: Family Medicine

## 2011-06-03 DIAGNOSIS — Z139 Encounter for screening, unspecified: Secondary | ICD-10-CM

## 2011-06-03 DIAGNOSIS — Z1231 Encounter for screening mammogram for malignant neoplasm of breast: Secondary | ICD-10-CM | POA: Insufficient documentation

## 2011-09-23 ENCOUNTER — Encounter: Payer: Self-pay | Admitting: Family Medicine

## 2011-09-23 ENCOUNTER — Other Ambulatory Visit: Payer: Self-pay | Admitting: Family Medicine

## 2011-09-23 ENCOUNTER — Ambulatory Visit (INDEPENDENT_AMBULATORY_CARE_PROVIDER_SITE_OTHER): Payer: BC Managed Care – PPO | Admitting: Family Medicine

## 2011-09-23 VITALS — BP 110/80 | HR 97 | Resp 15 | Ht 67.0 in | Wt 196.4 lb

## 2011-09-23 DIAGNOSIS — F419 Anxiety disorder, unspecified: Secondary | ICD-10-CM | POA: Insufficient documentation

## 2011-09-23 DIAGNOSIS — R7303 Prediabetes: Secondary | ICD-10-CM

## 2011-09-23 DIAGNOSIS — R5383 Other fatigue: Secondary | ICD-10-CM

## 2011-09-23 DIAGNOSIS — R7309 Other abnormal glucose: Secondary | ICD-10-CM

## 2011-09-23 DIAGNOSIS — Z139 Encounter for screening, unspecified: Secondary | ICD-10-CM

## 2011-09-23 DIAGNOSIS — I10 Essential (primary) hypertension: Secondary | ICD-10-CM

## 2011-09-23 DIAGNOSIS — E785 Hyperlipidemia, unspecified: Secondary | ICD-10-CM

## 2011-09-23 DIAGNOSIS — F411 Generalized anxiety disorder: Secondary | ICD-10-CM

## 2011-09-23 DIAGNOSIS — E8881 Metabolic syndrome: Secondary | ICD-10-CM

## 2011-09-23 MED ORDER — ALPRAZOLAM 0.25 MG PO TABS: ORAL_TABLET | ORAL | Status: DC

## 2011-09-23 NOTE — Patient Instructions (Addendum)
CPE in 3 month  Labs are past due,please do as soon as possible  new medication for anxiety , use sparingly , as needed at bedtime only  Please commit to 20 mins walk every day  Blood pressure is excellent

## 2011-09-23 NOTE — Progress Notes (Signed)
  Subjective:    Patient ID: [name], female    DOB: [birth year], 61 y.o.   MRN: [id number]  HPI The PT is here for follow up and re-evaluation of chronic medical conditions, medication management and review of any available recent lab and radiology data.  Preventive health is updated, specifically  Cancer screening and Immunization.   Questions or concerns regarding consultations or procedures which the PT has had in the interim are  addressed. The PT denies any adverse reactions to current medications since the last visit.  C/o increased and uncontrolled stress due to continued deterioration in her Mom's health and just today she lost her godmother who was dear to her . Tearful, reports poor sleep and irritability      Review of Systems See HPI Denies recent fever or chills. Denies sinus pressure, nasal congestion, ear pain or sore throat. Denies chest congestion, productive cough or wheezing. Denies chest pains, palpitations and leg swelling Denies abdominal pain, nausea, vomiting,diarrhea or constipation.   Denies dysuria, frequency, hesitancy or incontinence. Denies joint pain, swelling and limitation in mobility. Denies headaches, seizures, numbness, or tingling. Denies skin break down or rash.        Objective:   Physical Exam Patient alert and oriented and in no cardiopulmonary distress.  HEENT: No facial asymmetry, EOMI, no sinus tenderness,  oropharynx pink and moist.  Neck supple no adenopathy.  Chest: Clear to auscultation bilaterally.  CVS: S1, S2 no murmurs, no S3.  ABD: Soft non tender. Bowel sounds normal.  Ext: No edema  MS: Adequate ROM spine, shoulders, hips and knees.  Skin: Intact, no ulcerations or rash noted.  Psych: Good eye contact, normal affect. Memory intact tearful anxious and mildly depressed appearing.  CNS: CN 2-12 intact, power, tone and sensation normal throughout.        Assessment & Plan:

## 2011-09-24 ENCOUNTER — Telehealth: Payer: Self-pay | Admitting: Family Medicine

## 2011-09-24 DIAGNOSIS — J9801 Acute bronchospasm: Secondary | ICD-10-CM

## 2011-09-24 DIAGNOSIS — E785 Hyperlipidemia, unspecified: Secondary | ICD-10-CM

## 2011-09-24 DIAGNOSIS — J209 Acute bronchitis, unspecified: Secondary | ICD-10-CM

## 2011-09-24 MED ORDER — ROSUVASTATIN CALCIUM 10 MG PO TABS: 10.0000 mg | ORAL_TABLET | Freq: Every day | ORAL | Status: DC

## 2011-09-24 MED ORDER — ALBUTEROL SULFATE HFA 108 (90 BASE) MCG/ACT IN AERS: 2.0000 | INHALATION_SPRAY | Freq: Four times a day (QID) | RESPIRATORY_TRACT | Status: DC | PRN

## 2011-09-24 MED ORDER — MONTELUKAST SODIUM 10 MG PO TABS: 10.0000 mg | ORAL_TABLET | Freq: Every day | ORAL | Status: DC

## 2011-09-24 NOTE — Telephone Encounter (Signed)
Sent in

## 2011-09-28 DIAGNOSIS — E8881 Metabolic syndrome: Secondary | ICD-10-CM | POA: Insufficient documentation

## 2011-09-28 DIAGNOSIS — R7303 Prediabetes: Secondary | ICD-10-CM | POA: Insufficient documentation

## 2011-09-28 NOTE — Assessment & Plan Note (Signed)
Hyperlipidemia:Low fat diet discussed and encouraged.  Updated lab needed 

## 2011-09-28 NOTE — Assessment & Plan Note (Signed)
Increased and uncontrolled due to ongoing ill health of mother, start med as needed

## 2011-09-28 NOTE — Assessment & Plan Note (Signed)
Improved. Pt applauded on succesful weight loss through lifestyle change, and encouraged to continue same. Weight loss goal set for the next several months.  

## 2011-09-28 NOTE — Assessment & Plan Note (Signed)
counselled re importance of lifestyle change and weight loss to prevent /delay diabetes, will fllow hBA1C closely

## 2011-09-28 NOTE — Assessment & Plan Note (Signed)
Controlled, no change in medication  

## 2011-12-19 ENCOUNTER — Ambulatory Visit: Payer: BC Managed Care – PPO | Admitting: Family Medicine

## 2011-12-24 ENCOUNTER — Ambulatory Visit (INDEPENDENT_AMBULATORY_CARE_PROVIDER_SITE_OTHER): Payer: BC Managed Care – PPO | Admitting: Family Medicine

## 2011-12-24 ENCOUNTER — Other Ambulatory Visit: Payer: Self-pay | Admitting: Family Medicine

## 2011-12-24 ENCOUNTER — Encounter: Payer: Self-pay | Admitting: Family Medicine

## 2011-12-24 VITALS — BP 118/78 | HR 85 | Resp 16 | Ht 67.0 in | Wt 197.4 lb

## 2011-12-24 DIAGNOSIS — R5383 Other fatigue: Secondary | ICD-10-CM

## 2011-12-24 DIAGNOSIS — E785 Hyperlipidemia, unspecified: Secondary | ICD-10-CM

## 2011-12-24 DIAGNOSIS — R5381 Other malaise: Secondary | ICD-10-CM

## 2011-12-24 DIAGNOSIS — I10 Essential (primary) hypertension: Secondary | ICD-10-CM

## 2011-12-24 DIAGNOSIS — M899 Disorder of bone, unspecified: Secondary | ICD-10-CM

## 2011-12-24 DIAGNOSIS — E8881 Metabolic syndrome: Secondary | ICD-10-CM

## 2011-12-24 DIAGNOSIS — J209 Acute bronchitis, unspecified: Secondary | ICD-10-CM

## 2011-12-24 DIAGNOSIS — M949 Disorder of cartilage, unspecified: Secondary | ICD-10-CM

## 2011-12-24 DIAGNOSIS — E669 Obesity, unspecified: Secondary | ICD-10-CM

## 2011-12-24 DIAGNOSIS — R7309 Other abnormal glucose: Secondary | ICD-10-CM

## 2011-12-24 DIAGNOSIS — R7303 Prediabetes: Secondary | ICD-10-CM

## 2011-12-24 DIAGNOSIS — J9801 Acute bronchospasm: Secondary | ICD-10-CM

## 2011-12-24 LAB — CBC WITH DIFFERENTIAL/PLATELET
Basophils Absolute: 0 10*3/uL (ref 0.0–0.1)
Basophils Relative: 0 % (ref 0–1)
Eosinophils Absolute: 0.2 10*3/uL (ref 0.0–0.7)
Eosinophils Relative: 4 % (ref 0–5)
HCT: 42.3 % (ref 36.0–46.0)
Hemoglobin: 14.1 g/dL (ref 12.0–15.0)
MCH: 27.4 pg (ref 26.0–34.0)
MCHC: 33.3 g/dL (ref 30.0–36.0)
MCV: 82.1 fL (ref 78.0–100.0)
Monocytes Absolute: 0.5 10*3/uL (ref 0.1–1.0)
Monocytes Relative: 7 % (ref 3–12)
RDW: 15.3 % (ref 11.5–15.5)

## 2011-12-24 LAB — TSH: TSH: 0.77 u[IU]/mL (ref 0.350–4.500)

## 2011-12-24 LAB — LIPID PANEL
LDL Cholesterol: 83 mg/dL (ref 0–99)
VLDL: 14 mg/dL (ref 0–40)

## 2011-12-24 LAB — HEMOGLOBIN A1C: Mean Plasma Glucose: 126 mg/dL — ABNORMAL HIGH (ref <117)

## 2011-12-24 MED ORDER — ALBUTEROL SULFATE HFA 108 (90 BASE) MCG/ACT IN AERS: 2.0000 | INHALATION_SPRAY | Freq: Four times a day (QID) | RESPIRATORY_TRACT | Status: DC | PRN

## 2011-12-24 MED ORDER — FLUTICASONE PROPIONATE 50 MCG/ACT NA SUSP: 1.0000 | Freq: Every day | NASAL | Status: DC

## 2011-12-24 NOTE — Patient Instructions (Addendum)
CPE in January.Call if you need me before please  Please drop off FMLA form   Please get TB skin test.  It is important that you exercise regularly at least 60 minutes 6  times a week. If you develop chest pain, have severe difficulty breathing, or feel very tired, stop exercising immediately and seek medical attention   A healthy diet is rich in fruit, vegetables and whole grains. Poultry fish, nuts and beans are a healthy choice for protein rather then red meat. A low sodium diet and drinking 64 ounces of water daily is generally recommended. Oils and sweet should be limited. Carbohydrates especially for those who are diabetic or overweight, should be limited to 30-45 gram per meal. It is important to eat on a regular schedule, at least 3 times daily. Snacks should be primarily fruits, vegetables or nuts.  Fasting cbc, lipid, cmp, TSH, HBa1C  Vit D today  HBa1C, non fasting, in 4 months  I am happy that you are doing better  Weight loss goal of 6 to 8 pounds.  You are prediabetic so you need to make eating change, increased activity and weight loss serious goals

## 2011-12-25 LAB — COMPREHENSIVE METABOLIC PANEL
ALT: 11 U/L (ref 0–35)
AST: 19 U/L (ref 0–37)
Albumin: 4.2 g/dL (ref 3.5–5.2)
Alkaline Phosphatase: 93 U/L (ref 39–117)
Chloride: 103 mEq/L (ref 96–112)
Potassium: 4.5 mEq/L (ref 3.5–5.3)
Sodium: 138 mEq/L (ref 135–145)
Total Protein: 7.1 g/dL (ref 6.0–8.3)

## 2011-12-25 LAB — VITAMIN D 25 HYDROXY (VIT D DEFICIENCY, FRACTURES): Vit D, 25-Hydroxy: 38 ng/mL (ref 30–89)

## 2011-12-28 DIAGNOSIS — E669 Obesity, unspecified: Secondary | ICD-10-CM | POA: Insufficient documentation

## 2011-12-28 NOTE — Assessment & Plan Note (Signed)
Unchanged. Patient re-educated about  the importance of commitment to a  minimum of 150 minutes of exercise per week. The importance of healthy food choices with portion control discussed. Encouraged to start a food diary, count calories and to consider  joining a support group. Sample diet sheets offered. Goals set by the patient for the next several months.    

## 2011-12-28 NOTE — Assessment & Plan Note (Signed)
Improved and controlled, continue current med, low fat diet also reviewed and encouraged

## 2011-12-28 NOTE — Assessment & Plan Note (Signed)
Controlled, no change in medication DASH diet and commitment to daily physical activity for a minimum of 30 minutes discussed and encouraged, as a part of hypertension management. The importance of attaining a healthy weight is also discussed.  

## 2011-12-28 NOTE — Progress Notes (Signed)
  Subjective:    Patient ID: [name], female    DOB: 07/21/1950, 61 y.o.   MRN: [id number]  HPI The PT is here for follow up and re-evaluation of chronic medical conditions, medication management and review of any available recent lab and radiology data.  Preventive health is updated, specifically  Cancer screening and Immunization.   Questions or concerns regarding consultations or procedures which the PT has had in the interim are  addressed. The PT denies any adverse reactions to current medications since the last visit.  There are no new concerns.  There are no specific complaints       Review of Systems See HPI Denies recent fever or chills. Denies sinus pressure, nasal congestion, ear pain or sore throat. Denies chest congestion, productive cough or wheezing. Denies chest pains, palpitations and leg swelling Denies abdominal pain, nausea, vomiting,diarrhea or constipation.   Denies dysuria, frequency, hesitancy or incontinence. Denies joint pain, swelling and limitation in mobility. Denies headaches, seizures, numbness, or tingling. Denies depression, anxiety or insomnia. Denies skin break down or rash.        Objective:   Physical Exam Patient alert and oriented and in no cardiopulmonary distress.  HEENT: No facial asymmetry, EOMI, no sinus tenderness,  oropharynx pink and moist.  Neck supple no adenopathy.  Chest: Clear to auscultation bilaterally.  CVS: S1, S2 no murmurs, no S3.  ABD: Soft non tender. Bowel sounds normal.  Ext: No edema  MS: Adequate ROM spine, shoulders, hips and knees.  Skin: Intact, no ulcerations or rash noted.  Psych: Good eye contact, normal affect. Memory intact not anxious or depressed appearing.  CNS: CN 2-12 intact, power, tone and sensation normal throughout.        Assessment & Plan:   

## 2011-12-28 NOTE — Assessment & Plan Note (Signed)
Improved in the past 12 month.. Pt applauded on this and encouraged to work on normalizing blood sugars

## 2012-01-06 ENCOUNTER — Telehealth: Payer: Self-pay | Admitting: Family Medicine

## 2012-01-07 ENCOUNTER — Telehealth: Payer: Self-pay | Admitting: Family Medicine

## 2012-01-07 ENCOUNTER — Other Ambulatory Visit: Payer: Self-pay

## 2012-01-07 MED ORDER — PREDNISONE (PAK) 5 MG PO TABS: 5.0000 mg | ORAL_TABLET | ORAL | Status: DC

## 2012-01-07 MED ORDER — HYDROCOD POLST-CHLORPHEN POLST 10-8 MG/5ML PO LQCR: 5.0000 mL | Freq: Two times a day (BID) | ORAL | Status: DC | PRN

## 2012-01-07 NOTE — Telephone Encounter (Signed)
Sent note with her sister white sputum , cough x 4 days scratchy throat, no wheeze , lost her voice last year and does not want a repeat.  Advise pt I will send prednisone dose pack  And cough suppressant med.Pls fax historics after you speak with her  Since no fever or chills antibiotic not recommended at this time, office will be open next week even if I am not here so she should still call if needed.( I will alert Dr Jeanice Lim she may be calling also)

## 2012-01-07 NOTE — Telephone Encounter (Signed)
Pt aware and meds sent  

## 2012-01-08 NOTE — Telephone Encounter (Signed)
meds sent in for pt

## 2012-01-08 NOTE — Telephone Encounter (Signed)
Called patient back to find out what was going on. No answer. Left message

## 2012-01-21 ENCOUNTER — Ambulatory Visit (HOSPITAL_COMMUNITY)
Admission: RE | Admit: 2012-01-21 | Discharge: 2012-01-21 | Disposition: A | Discharge status: Home / Self Care | Payer: BC Managed Care – PPO | Source: Ambulatory Visit | Attending: Family Medicine | Admitting: Family Medicine

## 2012-01-21 ENCOUNTER — Ambulatory Visit (INDEPENDENT_AMBULATORY_CARE_PROVIDER_SITE_OTHER): Payer: BC Managed Care – PPO | Admitting: Family Medicine

## 2012-01-21 ENCOUNTER — Encounter: Payer: Self-pay | Admitting: Family Medicine

## 2012-01-21 VITALS — BP 118/80 | HR 97 | Temp 98.9°F | Resp 16 | Ht 67.0 in | Wt 196.0 lb

## 2012-01-21 DIAGNOSIS — J209 Acute bronchitis, unspecified: Secondary | ICD-10-CM | POA: Insufficient documentation

## 2012-01-21 DIAGNOSIS — J0100 Acute maxillary sinusitis, unspecified: Secondary | ICD-10-CM

## 2012-01-21 DIAGNOSIS — R062 Wheezing: Secondary | ICD-10-CM | POA: Insufficient documentation

## 2012-01-21 DIAGNOSIS — I10 Essential (primary) hypertension: Secondary | ICD-10-CM

## 2012-01-21 DIAGNOSIS — F411 Generalized anxiety disorder: Secondary | ICD-10-CM

## 2012-01-21 DIAGNOSIS — E785 Hyperlipidemia, unspecified: Secondary | ICD-10-CM

## 2012-01-21 DIAGNOSIS — F419 Anxiety disorder, unspecified: Secondary | ICD-10-CM

## 2012-01-21 MED ORDER — LEVOFLOXACIN 750 MG PO TABS: 750.0000 mg | ORAL_TABLET | Freq: Every day | ORAL | Status: AC

## 2012-01-21 MED ORDER — FLUCONAZOLE 150 MG PO TABS: ORAL_TABLET | ORAL | Status: DC

## 2012-01-21 NOTE — Patient Instructions (Addendum)
F/u as before.  You are being treated for acute right maxillary sinusitis and bronchitis.   Pls do CXR today.  Medication is sent in.  You are referred for lung function test

## 2012-01-26 NOTE — Assessment & Plan Note (Addendum)
Acute flare, albuterol prescribed, and pt referred for PFT's

## 2012-01-26 NOTE — Assessment & Plan Note (Signed)
Controlled, no change in medication  

## 2012-01-26 NOTE — Assessment & Plan Note (Signed)
Acute episode, antibiotic course prescribed 

## 2012-01-26 NOTE — Assessment & Plan Note (Signed)
Controlled, no change in medication DASH diet and commitment to daily physical activity for a minimum of 30 minutes discussed and encouraged, as a part of hypertension management. The importance of attaining a healthy weight is also discussed.  

## 2012-01-26 NOTE — Assessment & Plan Note (Signed)
Improved, as mother's health has improved

## 2012-01-26 NOTE — Progress Notes (Signed)
  Subjective:    Patient ID: [name], female    DOB: 01-12-1951, 61 y.o.   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   Denies dysuria, frequency, hesitancy or incontinence. Denies joint pain, swelling and limitation in mobility. Denies headaches, seizures, numbness, or tingling. Denies depression, anxiety or insomnia. Denies skin break down or rash.        Objective:   Physical Exam Patient alert and oriented and in no cardiopulmonary distress.Ill appearing  HEENT: No facial asymmetry, EOMI, maxillary  sinus tenderness,  oropharynx pink and moist.  Neck supple no adenopathy.  Chest: .Decreased air entry throughout few crackles, no wheezes  CVS: S1, S2 no murmurs, no S3.  ABD: Soft non tender. Bowel sounds normal.  Ext: No edema  MS: Adequate ROM spine, shoulders, hips and knees.  Skin: Intact, no ulcerations or rash noted.  Psych: Good eye contact, normal affect. Memory intact not anxious or depressed appearing.  CNS: CN 2-12 intact, power, tone and sensation normal throughout.        Assessment & Plan:

## 2012-03-12 ENCOUNTER — Ambulatory Visit (HOSPITAL_COMMUNITY)
Admission: RE | Admit: 2012-03-12 | Discharge: 2012-03-12 | Disposition: A | Discharge status: Home / Self Care | Payer: BC Managed Care – PPO | Source: Ambulatory Visit | Attending: Family Medicine | Admitting: Family Medicine

## 2012-03-12 DIAGNOSIS — R062 Wheezing: Secondary | ICD-10-CM | POA: Insufficient documentation

## 2012-03-25 NOTE — Procedures (Signed)
[name], [name]               ACCOUNT NO.:  [id number]  MEDICAL RECORD NO.:  [id number]  LOCATION:  RESP                          FACILITY:  [facility]  PHYSICIAN:  Zaquan Duffner L. Juanetta Gosling, M.D.DATE OF BIRTH:  1950-06-03  DATE OF PROCEDURE: DATE OF DISCHARGE:  03/12/2012                           PULMONARY FUNCTION TEST   Reason for pulmonary function testing is wheezing due to inhaled irritants now. 1. Spirometry is normal. 2. Lung volumes are normal. 3. DLCO is mildly reduced but does correct when ventilation is     accounted for. 4. Airway resistance is slightly high which may indicate airflow     obstruction. 5. This study is consistent with reactive airway disease related to     irritants.     Usiel Astarita L. Juanetta Gosling, M.D.     [facility]  D:  03/24/2012  T:  03/25/2012  Job:  [id number]  cc:   Milus Mallick. Lodema Hong, M.D. Fax: [phone]

## 2012-03-26 ENCOUNTER — Telehealth: Payer: Self-pay | Admitting: Family Medicine

## 2012-03-26 NOTE — Telephone Encounter (Signed)
Pls let pt know lung function is normal. Study shows that she has reactive airway disease related to irritants, so needs to avoid what she knows irritates her , like paint, and also have albuterol inhaler available for as needed use

## 2012-03-31 LAB — PULMONARY FUNCTION TEST

## 2012-03-31 NOTE — Telephone Encounter (Signed)
Called and left message on personal voicemail.

## 2012-04-27 ENCOUNTER — Ambulatory Visit (INDEPENDENT_AMBULATORY_CARE_PROVIDER_SITE_OTHER): Payer: BC Managed Care – PPO | Admitting: Family Medicine

## 2012-04-27 ENCOUNTER — Encounter: Payer: Self-pay | Admitting: Family Medicine

## 2012-04-27 VITALS — BP 122/80 | HR 82 | Resp 15 | Ht 67.0 in | Wt 198.0 lb

## 2012-04-27 DIAGNOSIS — E8881 Metabolic syndrome: Secondary | ICD-10-CM

## 2012-04-27 DIAGNOSIS — E669 Obesity, unspecified: Secondary | ICD-10-CM

## 2012-04-27 DIAGNOSIS — I10 Essential (primary) hypertension: Secondary | ICD-10-CM

## 2012-04-27 DIAGNOSIS — J309 Allergic rhinitis, unspecified: Secondary | ICD-10-CM

## 2012-04-27 DIAGNOSIS — J9801 Acute bronchospasm: Secondary | ICD-10-CM

## 2012-04-27 DIAGNOSIS — R5383 Other fatigue: Secondary | ICD-10-CM

## 2012-04-27 DIAGNOSIS — R7309 Other abnormal glucose: Secondary | ICD-10-CM

## 2012-04-27 DIAGNOSIS — R7303 Prediabetes: Secondary | ICD-10-CM

## 2012-04-27 DIAGNOSIS — R5381 Other malaise: Secondary | ICD-10-CM

## 2012-04-27 DIAGNOSIS — E785 Hyperlipidemia, unspecified: Secondary | ICD-10-CM

## 2012-04-27 MED ORDER — MONTELUKAST SODIUM 10 MG PO TABS: 10.0000 mg | ORAL_TABLET | Freq: Every day | ORAL | Status: DC

## 2012-04-27 MED ORDER — ROSUVASTATIN CALCIUM 10 MG PO TABS: 10.0000 mg | ORAL_TABLET | Freq: Every day | ORAL | Status: DC

## 2012-04-27 MED ORDER — VALSARTAN 80 MG PO TABS: ORAL_TABLET | ORAL | Status: DC

## 2012-04-27 MED ORDER — FLUTICASONE PROPIONATE 50 MCG/ACT NA SUSP: 1.0000 | Freq: Every day | NASAL | Status: DC

## 2012-04-27 NOTE — Progress Notes (Signed)
  Subjective:    Patient ID: [name], female    DOB: 12-21-1950, 61 y.o.   MRN: [id number]  HPI The PT is here for follow up and re-evaluation of chronic medical conditions, medication management and review of any available recent lab and radiology data.  Preventive health is updated, specifically  Cancer screening and Immunization.   Questions or concerns regarding consultations or procedures which the PT has had in the interim are  addressed. The PT denies any adverse reactions to current medications since the last visit.  There are no new concerns.  There are no specific complaints       Review of Systems See HPI Denies recent fever or chills. Denies sinus pressure, nasal congestion, ear pain or sore throat. Denies chest congestion, productive cough or wheezing. Denies chest pains, palpitations and leg swelling Denies abdominal pain, nausea, vomiting,diarrhea or constipation.   Denies dysuria, frequency, hesitancy or incontinence. Denies joint pain, swelling and limitation in mobility. Denies headaches, seizures, numbness, or tingling. Denies depression, anxiety or insomnia. Denies skin break down or rash.        Objective:   Physical Exam Patient alert and oriented and in no cardiopulmonary distress.  HEENT: No facial asymmetry, EOMI, no sinus tenderness,  oropharynx pink and moist.  Neck supple no adenopathy.  Chest: Clear to auscultation bilaterally.  CVS: S1, S2 no murmurs, no S3.  ABD: Soft non tender. Bowel sounds normal.  Ext: No edema  MS: Adequate ROM spine, shoulders, hips and knees.  Skin: Intact, no ulcerations or rash noted.  Psych: Good eye contact, normal affect. Memory intact not anxious or depressed appearing.  CNS: CN 2-12 intact, power, tone and sensation normal throughout.        Assessment & Plan:

## 2012-04-27 NOTE — Patient Instructions (Addendum)
CPE July 6 or after.Please call if you have   Fasting lipid, cmp and HBa1C  First week in January    It is important that you exercise regularly at least 30 minutes 5 times a week. If you develop chest pain, have severe difficulty breathing, or feel very tired, stop exercising immediately and seek medical attention   A healthy diet is rich in fruit, vegetables and whole grains. Poultry fish, nuts and beans are a healthy choice for protein rather then red meat. A low sodium diet and drinking 64 ounces of water daily is generally recommended. Oils and sweet should be limited. Carbohydrates especially for those who are diabetic or overweight, should be limited to 30-45 gram per meal. It is important to eat on a regular schedule, at least 3 times daily. Snacks should be primarily fruits, vegetables or nuts.  Blood pressure is excellent no med changes

## 2012-05-09 NOTE — Assessment & Plan Note (Signed)
Controlled, no change in medication  

## 2012-05-09 NOTE — Assessment & Plan Note (Signed)
Controlled, no change in medication DASH diet and commitment to daily physical activity for a minimum of 30 minutes discussed and encouraged, as a part of hypertension management. The importance of attaining a healthy weight is also discussed.  

## 2012-05-09 NOTE — Assessment & Plan Note (Signed)
Re educated re need to work on weight loss and lifestyle change to prevent diabetes. Updated lab in next 2 to 3 weeks

## 2012-05-09 NOTE — Assessment & Plan Note (Signed)
Unchanged. Patient re-educated about  the importance of commitment to a  minimum of 150 minutes of exercise per week. The importance of healthy food choices with portion control discussed. Encouraged to start a food diary, count calories and to consider  joining a support group. Sample diet sheets offered. Goals set by the patient for the next several months.    

## 2012-05-27 ENCOUNTER — Telehealth: Payer: Self-pay | Admitting: Family Medicine

## 2012-05-27 NOTE — Telephone Encounter (Signed)
noted 

## 2012-10-21 ENCOUNTER — Ambulatory Visit (INDEPENDENT_AMBULATORY_CARE_PROVIDER_SITE_OTHER): Payer: BC Managed Care – PPO | Admitting: Family Medicine

## 2012-10-21 ENCOUNTER — Encounter: Payer: Self-pay | Admitting: Family Medicine

## 2012-10-21 VITALS — BP 128/80 | HR 78 | Resp 18 | Ht 67.0 in | Wt 197.1 lb

## 2012-10-21 DIAGNOSIS — R7303 Prediabetes: Secondary | ICD-10-CM

## 2012-10-21 DIAGNOSIS — E785 Hyperlipidemia, unspecified: Secondary | ICD-10-CM

## 2012-10-21 DIAGNOSIS — R7309 Other abnormal glucose: Secondary | ICD-10-CM

## 2012-10-21 DIAGNOSIS — J209 Acute bronchitis, unspecified: Secondary | ICD-10-CM

## 2012-10-21 DIAGNOSIS — R7302 Impaired glucose tolerance (oral): Secondary | ICD-10-CM

## 2012-10-21 DIAGNOSIS — E669 Obesity, unspecified: Secondary | ICD-10-CM

## 2012-10-21 DIAGNOSIS — I10 Essential (primary) hypertension: Secondary | ICD-10-CM

## 2012-10-21 DIAGNOSIS — Z1211 Encounter for screening for malignant neoplasm of colon: Secondary | ICD-10-CM

## 2012-10-21 MED ORDER — VALSARTAN 80 MG PO TABS: ORAL_TABLET | ORAL | Status: DC

## 2012-10-21 MED ORDER — BENZONATATE 100 MG PO CAPS: 100.0000 mg | ORAL_CAPSULE | Freq: Four times a day (QID) | ORAL | Status: DC | PRN

## 2012-10-21 MED ORDER — ROSUVASTATIN CALCIUM 10 MG PO TABS: 10.0000 mg | ORAL_TABLET | Freq: Every day | ORAL | Status: DC

## 2012-10-21 MED ORDER — AZITHROMYCIN 250 MG PO TABS: ORAL_TABLET | ORAL | Status: AC

## 2012-10-21 NOTE — Patient Instructions (Addendum)
F/U in December   Fasting lipid cmp and HBA1C as soon as possible  Rectal today  Z pack and tessalon perles prescribed fo cough  Please schedule your mammogram asap  cBC , fasting lipid, cmp and hBA1C in December  It is important that you exercise regularly at least 30 minutes 5 times a week. If you develop chest pain, have severe difficulty breathing, or feel very tired, stop exercising immediately and seek medical attention     A healthy diet is rich in fruit, vegetables and whole grains. Poultry fish, nuts and beans are a healthy choice for protein rather then red meat. A low sodium diet and drinking 64 ounces of water daily is generally recommended. Oils and sweet should be limited. Carbohydrates especially for those who are diabetic or overweight, should be limited to 30-45 gram per meal. It is important to eat on a regular schedule, at least 3 times daily. Snacks should be primarily fruits, vegetables or nuts.

## 2012-10-21 NOTE — Progress Notes (Signed)
  Subjective:    Patient ID: [name], female    DOB: 07-Jan-1951, 62 y.o.   MRN: [id number]  HPI The PT is here for follow up and re-evaluation of chronic medical conditions, medication management and review of any available recent lab and radiology data.  Preventive health is updated, specifically  Cancer screening and Immunization.   Reports less right ankle pain when she is off her feet The PT denies any adverse reactions to current medications since the last visit.  7 day h/o head and chest congestion , with yellow sputum and intermittent chills, no documented fever      Review of Systems See HPI  Denies chest pains, palpitations and leg swelling Denies abdominal pain, nausea, vomiting,diarrhea or constipation.   Denies dysuria, frequency, hesitancy or incontinence.  Denies headaches, seizures, numbness, or tingling. Denies depression, anxiety or insomnia. Denies skin break down or rash.        Objective:   Physical Exam  Patient alert and oriented and in no cardiopulmonary distress.  HEENT: No facial asymmetry, EOMI, maxillary sinus tenderness,  oropharynx pink and moist.  Neck supple no adenopathy.  Chest: Adequate air entry, scattered crackles, no wheezes  CVS: S1, S2 no murmurs, no S3.  ABD: Soft non tender. Bowel sounds normal.  Ext: No edema  MS: Adequate ROM spine, shoulders, hips and knees.  Skin: Intact, no ulcerations or rash noted.  Psych: Good eye contact, normal affect. Memory intact not anxious or depressed appearing.  CNS: CN 2-12 intact, power, tone and sensation normal throughout.       Assessment & Plan:

## 2012-10-23 NOTE — Assessment & Plan Note (Signed)
Controlled, no change in medication DASH diet and commitment to daily physical activity for a minimum of 30 minutes discussed and encouraged, as a part of hypertension management. The importance of attaining a healthy weight is also discussed.  

## 2012-10-23 NOTE — Assessment & Plan Note (Signed)
Improved. Pt applauded on succesful weight loss through lifestyle change, and encouraged to continue same. Weight loss goal set for the next several months.  

## 2012-10-23 NOTE — Assessment & Plan Note (Signed)
Updated lab needed Patient educated about the importance of limiting  Carbohydrate intake , the need to commit to daily physical activity for a minimum of 30 minutes , and to commit weight loss. The fact that changes in all these areas will reduce or eliminate all together the development of diabetes is stressed.    

## 2012-10-23 NOTE — Assessment & Plan Note (Signed)
Hyperlipidemia:Low fat diet discussed and encouraged.  Updated lab needed 

## 2012-10-23 NOTE — Assessment & Plan Note (Signed)
Antibiotic and decongestant prescribed 

## 2013-02-17 ENCOUNTER — Other Ambulatory Visit: Payer: Self-pay | Admitting: Family Medicine

## 2013-02-17 ENCOUNTER — Telehealth: Payer: Self-pay

## 2013-02-17 MED ORDER — AZITHROMYCIN 250 MG PO TABS: ORAL_TABLET | ORAL | Status: AC

## 2013-02-17 MED ORDER — PREDNISONE 5 MG PO TABS: 5.0000 mg | ORAL_TABLET | Freq: Two times a day (BID) | ORAL | Status: AC

## 2013-02-17 NOTE — Telephone Encounter (Signed)
z pack sent to [facility], pt  notified, sent to [facility], also pred 5mg  twice daily for 5 days

## 2013-02-17 NOTE — Telephone Encounter (Signed)
Pt advised to call for appt next week if no better, prednisone for 5 days also prescribed

## 2013-02-17 NOTE — Telephone Encounter (Signed)
Started yesterday, hurting under her right eye and bridge of nose with pain and pressure. Some coughing with small amount of whitish phlegm. Has been using tussinex and tylenol sinus. Scared of it turning into bronchitis. Can't come in tomorrow because she has midterms and wants something called into [facility] [place]

## 2013-02-23 ENCOUNTER — Ambulatory Visit: Payer: BC Managed Care – PPO | Admitting: Family Medicine

## 2013-02-24 ENCOUNTER — Encounter: Payer: Self-pay | Admitting: Family Medicine

## 2013-02-24 ENCOUNTER — Ambulatory Visit (INDEPENDENT_AMBULATORY_CARE_PROVIDER_SITE_OTHER): Payer: BC Managed Care – PPO | Admitting: Family Medicine

## 2013-02-24 ENCOUNTER — Ambulatory Visit (HOSPITAL_COMMUNITY)
Admission: RE | Admit: 2013-02-24 | Discharge: 2013-02-24 | Disposition: A | Discharge status: Home / Self Care | Payer: BC Managed Care – PPO | Source: Ambulatory Visit | Attending: Family Medicine | Admitting: Family Medicine

## 2013-02-24 VITALS — BP 132/80 | HR 92 | Temp 99.2°F | Resp 18 | Ht 67.0 in | Wt 197.0 lb

## 2013-02-24 DIAGNOSIS — I10 Essential (primary) hypertension: Secondary | ICD-10-CM

## 2013-02-24 DIAGNOSIS — R059 Cough, unspecified: Secondary | ICD-10-CM | POA: Insufficient documentation

## 2013-02-24 DIAGNOSIS — E785 Hyperlipidemia, unspecified: Secondary | ICD-10-CM

## 2013-02-24 DIAGNOSIS — J0100 Acute maxillary sinusitis, unspecified: Secondary | ICD-10-CM

## 2013-02-24 DIAGNOSIS — R05 Cough: Secondary | ICD-10-CM | POA: Insufficient documentation

## 2013-02-24 DIAGNOSIS — J320 Chronic maxillary sinusitis: Secondary | ICD-10-CM

## 2013-02-24 DIAGNOSIS — J209 Acute bronchitis, unspecified: Secondary | ICD-10-CM

## 2013-02-24 DIAGNOSIS — E669 Obesity, unspecified: Secondary | ICD-10-CM

## 2013-02-24 MED ORDER — FLUCONAZOLE 150 MG PO TABS: ORAL_TABLET | ORAL | Status: DC

## 2013-02-24 MED ORDER — ALBUTEROL SULFATE HFA 108 (90 BASE) MCG/ACT IN AERS: 2.0000 | INHALATION_SPRAY | Freq: Four times a day (QID) | RESPIRATORY_TRACT | Status: DC | PRN

## 2013-02-24 MED ORDER — LEVOFLOXACIN 750 MG PO TABS: 750.0000 mg | ORAL_TABLET | Freq: Every day | ORAL | Status: AC

## 2013-02-24 MED ORDER — METHYLPREDNISOLONE ACETATE 80 MG/ML IJ SUSP
80.0000 mg | Freq: Once | INTRAMUSCULAR | Status: AC
  Administered 2013-02-24: 80 mg via INTRAMUSCULAR

## 2013-02-24 NOTE — Patient Instructions (Addendum)
F/u as before  Depo medrol 80 mg in office for chest tightness  CXR today  Levaquin one tablet daily for 5 days for right sinus infection  Albuterol will be refilled

## 2013-02-24 NOTE — Progress Notes (Signed)
  Subjective:    Patient ID: [name], female    DOB: [birth year], 62 y.o.   MRN: [id number]  HPI Right maxillary pressure last week Friday, with ear pressure , cough is gradually improving , still has a lot of chest congestion, , sputum is clear, nasal drainage is clear , and cough  Is still excessive. Has had a lot of sick exposure on the job, concerned about probable pneumonia. Still a has occasional chills Pt treated with prednisone  Dose pack and a z pack last week, some improvement , but stills smptomatic   Review of Systems See HPI  Denies chest pains, palpitations and leg swelling Denies abdominal pain, nausea, vomiting,diarrhea or constipation.   Denies dysuria, frequency, hesitancy or incontinence. Denies joint pain, swelling and limitation in mobility. Denies headaches, seizures, numbness, or tingling. Denies depression, anxiety or insomnia. Denies skin break down or rash.        Objective:   Physical Exam  Patient alert and oriented and in no cardiopulmonary distress.  HEENT: No facial asymmetry, EOMI, right maxillary  sinus tenderness,  oropharynx pink and moist.  Neck supple right anterior cervical adenopathy.  Chest: adequate air entry, no wheezes few bilateral crackles  CVS: S1, S2 no murmurs, no S3.  ABD: Soft non tender. Bowel sounds normal.  Ext: No edema  MS: Adequate ROM spine, shoulders, hips and knees.  Skin: Intact, no ulcerations or rash noted.  Psych: Good eye contact, normal affect. Memory intact not anxious or depressed appearing.  CNS: CN 2-12 intact, power, tone and sensation normal throughout.       Assessment & Plan:

## 2013-02-25 ENCOUNTER — Encounter: Payer: Self-pay | Admitting: Family Medicine

## 2013-02-26 DIAGNOSIS — J0100 Acute maxillary sinusitis, unspecified: Secondary | ICD-10-CM | POA: Insufficient documentation

## 2013-02-26 NOTE — Assessment & Plan Note (Addendum)
Acute right maxillary sinusitis, levaquin prescribed

## 2013-02-26 NOTE — Assessment & Plan Note (Signed)
Controlled, no change in medication DASH diet and commitment to daily physical activity for a minimum of 30 minutes discussed and encouraged, as a part of hypertension management. The importance of attaining a healthy weight is also discussed.  

## 2013-02-26 NOTE — Assessment & Plan Note (Signed)
Unchanged. Patient re-educated about  the importance of commitment to a  minimum of 150 minutes of exercise per week. The importance of healthy food choices with portion control discussed. Encouraged to start a food diary, count calories and to consider  joining a support group. Sample diet sheets offered. Goals set by the patient for the next several months.    

## 2013-02-26 NOTE — Assessment & Plan Note (Signed)
Improved with medication , however chest congestion and cough persist, CXR to eval for pneumonia, reports excessive sick exposure on  the job Refill albuterol has h/o wheeze

## 2013-02-26 NOTE — Assessment & Plan Note (Signed)
Hyperlipidemia:Low fat diet discussed and encouraged.  Updated lab past due  

## 2013-03-31 ENCOUNTER — Other Ambulatory Visit: Payer: Self-pay

## 2013-05-13 ENCOUNTER — Other Ambulatory Visit: Payer: Self-pay | Admitting: Family Medicine

## 2013-05-13 DIAGNOSIS — Z139 Encounter for screening, unspecified: Secondary | ICD-10-CM

## 2013-05-16 ENCOUNTER — Telehealth: Payer: Self-pay | Admitting: Family Medicine

## 2013-05-16 ENCOUNTER — Other Ambulatory Visit: Payer: Self-pay | Admitting: Family Medicine

## 2013-05-16 DIAGNOSIS — E785 Hyperlipidemia, unspecified: Secondary | ICD-10-CM

## 2013-05-16 LAB — CBC
Hemoglobin: 13 g/dL (ref 12.0–15.0)
RBC: 4.74 MIL/uL (ref 3.87–5.11)
RDW: 15.3 % (ref 11.5–15.5)
WBC: 6.9 10*3/uL (ref 4.0–10.5)

## 2013-05-16 LAB — LIPID PANEL
Cholesterol: 132 mg/dL (ref 0–200)
Total CHOL/HDL Ratio: 2.5 Ratio
Triglycerides: 60 mg/dL (ref <150)
VLDL: 12 mg/dL (ref 0–40)

## 2013-05-16 MED ORDER — VALSARTAN 80 MG PO TABS: ORAL_TABLET | ORAL | Status: DC

## 2013-05-16 MED ORDER — ROSUVASTATIN CALCIUM 10 MG PO TABS: 10.0000 mg | ORAL_TABLET | Freq: Every day | ORAL | Status: DC

## 2013-05-16 NOTE — Telephone Encounter (Signed)
Med refilled.

## 2013-05-23 LAB — COMPREHENSIVE METABOLIC PANEL
AST: 17 U/L (ref 0–37)
Albumin: 3.9 g/dL (ref 3.5–5.2)
Alkaline Phosphatase: 77 U/L (ref 39–117)
BUN: 11 mg/dL (ref 6–23)
CO2: 24 mEq/L (ref 19–32)
Glucose, Bld: 91 mg/dL (ref 70–99)
Potassium: 3.8 mEq/L (ref 3.5–5.3)

## 2013-06-03 ENCOUNTER — Ambulatory Visit (HOSPITAL_COMMUNITY)
Admission: RE | Admit: 2013-06-03 | Discharge: 2013-06-03 | Disposition: A | Discharge status: Home / Self Care | Payer: BC Managed Care – PPO | Source: Ambulatory Visit | Attending: Family Medicine | Admitting: Family Medicine

## 2013-06-03 DIAGNOSIS — Z139 Encounter for screening, unspecified: Secondary | ICD-10-CM

## 2013-06-03 DIAGNOSIS — Z1231 Encounter for screening mammogram for malignant neoplasm of breast: Secondary | ICD-10-CM | POA: Insufficient documentation

## 2013-06-30 ENCOUNTER — Encounter: Payer: BC Managed Care – PPO | Admitting: Family Medicine

## 2013-08-29 ENCOUNTER — Ambulatory Visit (INDEPENDENT_AMBULATORY_CARE_PROVIDER_SITE_OTHER): Payer: BC Managed Care – PPO | Admitting: Family Medicine

## 2013-08-29 ENCOUNTER — Encounter: Payer: Self-pay | Admitting: Family Medicine

## 2013-08-29 VITALS — BP 130/76 | HR 84 | Temp 98.8°F | Resp 18 | Ht 67.0 in | Wt 195.0 lb

## 2013-08-29 DIAGNOSIS — R7303 Prediabetes: Secondary | ICD-10-CM

## 2013-08-29 DIAGNOSIS — J0100 Acute maxillary sinusitis, unspecified: Secondary | ICD-10-CM

## 2013-08-29 DIAGNOSIS — J309 Allergic rhinitis, unspecified: Secondary | ICD-10-CM

## 2013-08-29 DIAGNOSIS — I10 Essential (primary) hypertension: Secondary | ICD-10-CM

## 2013-08-29 DIAGNOSIS — E785 Hyperlipidemia, unspecified: Secondary | ICD-10-CM

## 2013-08-29 DIAGNOSIS — E8881 Metabolic syndrome: Secondary | ICD-10-CM

## 2013-08-29 MED ORDER — AZITHROMYCIN 250 MG PO TABS: ORAL_TABLET | ORAL | Status: DC

## 2013-08-29 MED ORDER — PREDNISONE (PAK) 5 MG PO TABS: 5.0000 mg | ORAL_TABLET | ORAL | Status: DC

## 2013-08-29 MED ORDER — FLUCONAZOLE 150 MG PO TABS: ORAL_TABLET | ORAL | Status: AC

## 2013-08-29 MED ORDER — FLUTICASONE PROPIONATE 50 MCG/ACT NA SUSP: 2.0000 | Freq: Every day | NASAL | Status: DC

## 2013-08-29 NOTE — Patient Instructions (Addendum)
CPE in July 30 or after.Call if you need me before  You are being treated for uncontrolled allergies, depo medrol in office and prednisone sent in for 6 days , also flonase . Use flonase and loratidine every day for allergies , also flush with saline /netty pot twice daily for `uncontrolled allergies with excessive nasal drainage   OK to use sudafed one daily for the nexrt 2 to 3 days , for excessive nasal drainage   Fasting lipid, cmp, hBA1C for July visit please   oK to call for shingles vaccine between visits  You will get a script for a z pack, no indication for usage at this time, no symptom or sign of infection currently If you develop increased sinus pressure fever., green nasal drainage sore throat or cough productive of yellow green sputum , then fill the script and take as directed. If you do not need it , it will be detrimental to take an antibiotic   Work excuse to return 4/8 [id number] start today

## 2013-08-29 NOTE — Assessment & Plan Note (Addendum)
Uncontrolled, depomedrol in office followed by 6 day course of prednisone, Daily flonase and sudafed once dai.ly for 2 to 3 days Work excuse  For 2 days also. Script to hold if complication of sinus infection or bronchitis develops within the next 1 week, and pt educated on appropriate use

## 2013-08-29 NOTE — Assessment & Plan Note (Signed)
Controlled, no change in medication  

## 2013-08-29 NOTE — Progress Notes (Signed)
   Subjective:    Patient ID: [name], female    DOB: [birth year], 63 y.o.   MRN: [id number]  HPI 2 day h/o excessive head pressure, nasal congestion with difficulty breathing, maxillary sinus pressure, no chills or documented fever. Fatigued and was in bed most of yesterday and did not go to work today.Prior  to this has been well, no productive cough, or wheeze. Has been using tylenol for pain    Review of Systems See HPI  Denies chest pains, palpitations and leg swelling Denies abdominal pain, nausea, vomiting,diarrhea or constipation.   Denies dysuria, frequency, hesitancy or incontinence. Denies joint pain, swelling and limitation in mobility. Denies headaches, seizures, numbness, or tingling. Denies depression, anxiety or insomnia. Denies skin break down or rash.        Objective:   Physical Exam  BP 130/76  Pulse 84  Temp(Src) 98.8 F (37.1 C)  Resp 18  Ht 5\' 7"  (1.702 m)  Wt 195 lb 0.6 oz (88.47 kg)  BMI 30.54 kg/m2  SpO2 99% Patient alert and oriented and in no cardiopulmonary distress.  HEENT: No facial asymmetry, EOMI, maxillary  sinus tenderness,  oropharynx pink and moist.  Neck supple no adenopathy.Naasal mucosa erythematous and edematous, TM clear, external ear canals erythematous from excessive external rubbing  Chest: Clear to auscultation bilaterally.  CVS: S1, S2 no murmurs, no S3.  ABD: Soft non tender. Bowel sounds normal.  Ext: No edema  MS: Adequate ROM spine, shoulders, hips and knees.       Assessment & Plan:  ALLERGIC RHINITIS Uncontrolled, depomedrol in office followed by 6 day course of prednisone, Daily flonase and sudafed once dai.ly for 2 to 3 days Work excuse  For 2 days also. Script to hold if complication of sinus infection or bronchitis develops within the next 1 week, and pt educated on appropriate use  HYPERTENSION Controlled, no change in medication

## 2013-08-30 MED ORDER — METHYLPREDNISOLONE ACETATE 80 MG/ML IJ SUSP
80.0000 mg | Freq: Once | INTRAMUSCULAR | Status: AC
  Administered 2013-08-30: 80 mg via INTRAMUSCULAR

## 2013-08-30 NOTE — Addendum Note (Signed)
Addended by: Denman George B on: 08/30/2013 09:41 AM   Modules accepted: Orders

## 2013-10-05 ENCOUNTER — Other Ambulatory Visit: Payer: Self-pay

## 2013-10-05 ENCOUNTER — Telehealth: Payer: Self-pay | Admitting: Family Medicine

## 2013-10-05 MED ORDER — VALSARTAN 80 MG PO TABS: ORAL_TABLET | ORAL | Status: DC

## 2013-10-05 NOTE — Telephone Encounter (Signed)
Med refilled.

## 2013-11-10 ENCOUNTER — Telehealth: Payer: Self-pay

## 2013-11-10 NOTE — Telephone Encounter (Signed)
Patient made aware.  Will call around in the am and attempt to find eye dr that will do a same day visit.

## 2013-11-10 NOTE — Telephone Encounter (Signed)
Painful red eye needs eye specilaist to eval, if pt cannot asee her eye specialis, or any eye specialistt needs to go to urgent care

## 2013-11-18 ENCOUNTER — Other Ambulatory Visit: Payer: Self-pay | Admitting: Family Medicine

## 2013-12-26 ENCOUNTER — Encounter: Payer: BC Managed Care – PPO | Admitting: Family Medicine

## 2014-02-16 ENCOUNTER — Other Ambulatory Visit: Payer: Self-pay

## 2014-02-16 MED ORDER — VALSARTAN 80 MG PO TABS: ORAL_TABLET | ORAL | Status: DC

## 2014-02-16 MED ORDER — MONTELUKAST SODIUM 10 MG PO TABS: 10.0000 mg | ORAL_TABLET | Freq: Every day | ORAL | Status: DC

## 2014-02-16 MED ORDER — ROSUVASTATIN CALCIUM 10 MG PO TABS: ORAL_TABLET | ORAL | Status: DC

## 2014-03-01 ENCOUNTER — Other Ambulatory Visit: Payer: Self-pay | Admitting: Family Medicine

## 2014-03-01 ENCOUNTER — Ambulatory Visit (INDEPENDENT_AMBULATORY_CARE_PROVIDER_SITE_OTHER): Payer: Medicare HMO | Admitting: Family Medicine

## 2014-03-01 ENCOUNTER — Encounter: Payer: Self-pay | Admitting: Family Medicine

## 2014-03-01 VITALS — BP 114/82 | HR 89 | Temp 99.1°F | Resp 16 | Ht 67.0 in | Wt 191.0 lb

## 2014-03-01 DIAGNOSIS — Z0184 Encounter for antibody response examination: Secondary | ICD-10-CM

## 2014-03-01 DIAGNOSIS — J019 Acute sinusitis, unspecified: Secondary | ICD-10-CM | POA: Insufficient documentation

## 2014-03-01 DIAGNOSIS — I10 Essential (primary) hypertension: Secondary | ICD-10-CM

## 2014-03-01 DIAGNOSIS — J209 Acute bronchitis, unspecified: Secondary | ICD-10-CM | POA: Insufficient documentation

## 2014-03-01 DIAGNOSIS — J0100 Acute maxillary sinusitis, unspecified: Secondary | ICD-10-CM

## 2014-03-01 DIAGNOSIS — J208 Acute bronchitis due to other specified organisms: Secondary | ICD-10-CM

## 2014-03-01 MED ORDER — PREDNISONE 5 MG PO TABS: 5.0000 mg | ORAL_TABLET | Freq: Two times a day (BID) | ORAL | Status: AC

## 2014-03-01 MED ORDER — LEVOFLOXACIN 500 MG PO TABS: 500.0000 mg | ORAL_TABLET | Freq: Every day | ORAL | Status: DC

## 2014-03-01 NOTE — Assessment & Plan Note (Signed)
Controlled, no change in medication DASH diet and commitment to daily physical activity for a minimum of 30 minutes discussed and encouraged, as a part of hypertension management. The importance of attaining a healthy weight is also discussed.  

## 2014-03-01 NOTE — Assessment & Plan Note (Signed)
Antibiotic and prednisone prescribed 

## 2014-03-01 NOTE — Assessment & Plan Note (Signed)
2 week antibiotic course

## 2014-03-01 NOTE — Progress Notes (Signed)
   Subjective:    Patient ID: [name], female    DOB: 04-20-1951, 63 y.o.   MRN: [id number]  HPI 1 week h/o worsening head and chest congestion, associated with fever and chills intermittently. Nasal drainage has thickened , and is yellowish green, and at times bloody. Sputum is thick and yellow.Has used z pack, not totally better C/o bilateral ear pressure, denies hearing loss and sore throat. Increasing fatigue , poor appetitie and sleep disturbed by cough. No improvement with OTC medication.    Review of Systems See HPI  Denies chest pains, palpitations and leg swelling Denies abdominal pain, nausea, vomiting,diarrhea or constipation.   Denies dysuria, frequency, hesitancy or incontinence. Denies joint pain, swelling and limitation in mobility. Denies headaches, seizures, numbness, or tingling. Denies depression, anxiety or insomnia. Denies skin break down or rash.        Objective:   Physical Exam BP 114/82  Pulse 89  Temp(Src) 99.1 F (37.3 C) (Oral)  Resp 16  Ht 5\' 7"  (1.702 m)  Wt 191 lb (86.637 kg)  BMI 29.91 kg/m2  SpO2 97% Patient alert and oriented and in no cardiopulmonary distress.  HEENT: No facial asymmetry, EOMI,   oropharynx pink and moist.  Neck supple no JVD, no mass. Positive maxillary sinus tenderness, TM clear,  Chest: adequate air entry, bilateral crackles and few wheezes.  CVS: S1, S2 no murmurs, no S3.Regular rate.  ABD: Soft non tender.   Ext: No edema  MS: Adequate ROM spine, shoulders, hips and knees.  Skin: Intact, no ulcerations or rash noted.  Psych: Good eye contact, normal affect. Memory intact not anxious or depressed appearing.  CNS: CN 2-12 intact, power,  normal throughout.no focal deficits noted.        Assessment & Plan:   Essential hypertension Controlled, no change in medication  DASH diet and commitment to daily physical activity for a minimum of 30 minutes discussed and encouraged, as a part of  hypertension management. The importance of attaining a healthy weight is also discussed.   Acute bronchitis Antibiotic and prednisone prescribed  Maxillary sinusitis, acute 2 week antibiotic course

## 2014-03-01 NOTE — Patient Instructions (Addendum)
F/U as before  You are  Treated for acute sinusitis and bronchitis, levaquin is prescribed for 2 weeks, and prednisone for 5 days  Call if not better  Lab order for varicella and rubella titer for your job is provided  Print out of your immunization record to show last tetanus shot is provided  BP is excellent, no med change

## 2014-03-02 LAB — VARICELLA ZOSTER ANTIBODY, IGG: Varicella IgG: 773.5 Index — ABNORMAL HIGH (ref <135.00)

## 2014-03-03 ENCOUNTER — Other Ambulatory Visit: Payer: Self-pay

## 2014-03-03 LAB — HEPATITIS PANEL, ACUTE
HCV Ab: NEGATIVE
HEP A IGM: NONREACTIVE
HEP B C IGM: NONREACTIVE
Hepatitis B Surface Ag: NEGATIVE

## 2014-03-05 ENCOUNTER — Encounter: Payer: Self-pay | Admitting: Family Medicine

## 2014-03-06 LAB — RUBELLA ANTIBODY, IGM: Rubella IgM: 0.66

## 2014-03-22 ENCOUNTER — Other Ambulatory Visit: Payer: Self-pay | Admitting: Family Medicine

## 2014-03-22 LAB — COMPREHENSIVE METABOLIC PANEL
ALBUMIN: 4.2 g/dL (ref 3.5–5.2)
ALT: 13 U/L (ref 0–35)
AST: 17 U/L (ref 0–37)
Alkaline Phosphatase: 93 U/L (ref 39–117)
BUN: 11 mg/dL (ref 6–23)
CO2: 27 mEq/L (ref 19–32)
CREATININE: 0.76 mg/dL (ref 0.50–1.10)
Calcium: 9.6 mg/dL (ref 8.4–10.5)
Chloride: 101 mEq/L (ref 96–112)
Glucose, Bld: 85 mg/dL (ref 70–99)
POTASSIUM: 4.2 meq/L (ref 3.5–5.3)
Sodium: 139 mEq/L (ref 135–145)
Total Bilirubin: 0.6 mg/dL (ref 0.2–1.2)
Total Protein: 7.7 g/dL (ref 6.0–8.3)

## 2014-03-22 LAB — LIPID PANEL
CHOLESTEROL: 155 mg/dL (ref 0–200)
HDL: 58 mg/dL (ref >39)
LDL Cholesterol: 83 mg/dL (ref 0–99)
TRIGLYCERIDES: 70 mg/dL (ref <150)
Total CHOL/HDL Ratio: 2.7 Ratio
VLDL: 14 mg/dL (ref 0–40)

## 2014-03-22 LAB — HEMOGLOBIN A1C
Hgb A1c MFr Bld: 6.1 % — ABNORMAL HIGH (ref <5.7)
MEAN PLASMA GLUCOSE: 128 mg/dL — AB (ref <117)

## 2014-03-24 ENCOUNTER — Encounter: Payer: Self-pay | Admitting: Family Medicine

## 2014-03-24 ENCOUNTER — Other Ambulatory Visit (HOSPITAL_COMMUNITY)
Admission: RE | Admit: 2014-03-24 | Discharge: 2014-03-24 | Disposition: A | Discharge status: Home / Self Care | Payer: Medicare HMO | Source: Ambulatory Visit | Attending: Family Medicine | Admitting: Family Medicine

## 2014-03-24 ENCOUNTER — Ambulatory Visit (INDEPENDENT_AMBULATORY_CARE_PROVIDER_SITE_OTHER): Payer: Medicare HMO | Admitting: Family Medicine

## 2014-03-24 VITALS — BP 130/80 | HR 84 | Resp 18 | Ht 67.0 in | Wt 196.0 lb

## 2014-03-24 DIAGNOSIS — I10 Essential (primary) hypertension: Secondary | ICD-10-CM

## 2014-03-24 DIAGNOSIS — E8881 Metabolic syndrome: Secondary | ICD-10-CM

## 2014-03-24 DIAGNOSIS — R7303 Prediabetes: Secondary | ICD-10-CM

## 2014-03-24 DIAGNOSIS — Z124 Encounter for screening for malignant neoplasm of cervix: Secondary | ICD-10-CM

## 2014-03-24 DIAGNOSIS — Z1211 Encounter for screening for malignant neoplasm of colon: Secondary | ICD-10-CM

## 2014-03-24 DIAGNOSIS — M25512 Pain in left shoulder: Secondary | ICD-10-CM

## 2014-03-24 DIAGNOSIS — E785 Hyperlipidemia, unspecified: Secondary | ICD-10-CM

## 2014-03-24 DIAGNOSIS — K219 Gastro-esophageal reflux disease without esophagitis: Secondary | ICD-10-CM

## 2014-03-24 DIAGNOSIS — Z1151 Encounter for screening for human papillomavirus (HPV): Secondary | ICD-10-CM | POA: Diagnosis present

## 2014-03-24 DIAGNOSIS — Z23 Encounter for immunization: Secondary | ICD-10-CM | POA: Insufficient documentation

## 2014-03-24 DIAGNOSIS — Z0000 Encounter for general adult medical examination without abnormal findings: Secondary | ICD-10-CM

## 2014-03-24 LAB — POC HEMOCCULT BLD/STL (OFFICE/1-CARD/DIAGNOSTIC): Fecal Occult Blood, POC: NEGATIVE

## 2014-03-24 NOTE — Assessment & Plan Note (Signed)
Vaccine administered at visit.  

## 2014-03-24 NOTE — Patient Instructions (Signed)
F/u in 5.5 month, call if you need me before  You are referred about the left shoulder , we will call with appointm,ent  Please work on lifestyle changes as discussed  Weight loss goal of 6 to 10 pounds  No medication changes  CBC, TSH , HBA1C, fasting lipid , cmp and EGFR in 5.5 month

## 2014-03-24 NOTE — Progress Notes (Signed)
   Subjective:    Patient ID: [name], female    DOB: 02-20-1951, 63 y.o.   MRN: [id number]  HPI Patient is in for annual physical exam. 2 week h/o left shoulder pain with reduced mobility after she heard a "pop"  When she attempted to reach for a box of  Tissues Requests TdAP today, and this is given  Labs are reviewed, no med changes needed Review of Systems See HPI     Objective:   Physical Exam BP 130/80  Pulse 84  Resp 18  Ht 5\' 7"  (1.702 m)  Wt 196 lb (88.905 kg)  BMI 30.69 kg/m2  SpO2 98%   Pleasant well nourished female, alert and oriented x 3, in no cardio-pulmonary distress. Afebrile. HEENT No facial trauma or asymetry. Sinuses non tender.  Extra occullar muscles intact, pupils equally reactive to light. External ears normal, tympanic membranes clear. Oropharynx moist, no exudate, good dentition. Neck: supple, no adenopathy,JVD or thyromegaly.No bruits.  Chest: Clear to ascultation bilaterally.No crackles or wheezes. Non tender to palpation  Breast: No asymetry,no masses or lumps. No tenderness. No nipple discharge or inversion. No axillary or supraclavicular adenopathy  Cardiovascular system; Heart sounds normal,  S1 and  S2 ,no S3.  No murmur, or thrill. Apical beat not displaced Peripheral pulses normal.  Abdomen: Soft, non tender, no organomegaly or masses. No bruits. Bowel sounds normal. No guarding, tenderness or rebound.  Rectal:  Normal sphincter tone. No mass.No rectal masses.  Guaiac negative stool.  GU: External genitalia normal female genitalia , female distribution of hair. No lesions. Urethral meatus normal in size, no  Prolapse, no lesions visibly  Present. Bladder non tender. Vagina pink and moist , with no visible lesions , discharge present . Adequate pelvic support no  cystocele or rectocele noted Cervix pink and appears healthy, no lesions or ulcerations noted, no discharge noted from os Uterus normal size, no  adnexal masses, no cervical motion or adnexal tenderness.   Musculoskeletal exam: Full ROM of spine, hips ,  and knees.Decreased ROM left shoulder No deformity ,swelling or crepitus noted. No muscle wasting or atrophy.   Neurologic: Cranial nerves 2 to 12 intact. Power, tone ,sensation and reflexes normal throughout. No disturbance in gait. No tremor.  Skin: Intact, no ulceration, erythema , scaling or rash noted. Pigmentation normal throughout  Psych; Normal mood and affect. Judgement and concentration normal        Assessment & Plan:  Need for Tdap vaccination Vaccine administered at visit.   Encounter for annual physical exam Annual exam as documented. Counseling done  re healthy lifestyle involving commitment to 150 minutes exercise per week, heart healthy diet, and attaining healthy weight.The importance of adequate sleep also discussed. Regular seat belt use and home safety, is also discussed. Changes in health habits are decided on by the patient with goals and time frames  set for achieving them. Immunization and cancer screening needs are specifically addressed at this visit.   Shoulder pain, left Acute non traumatic left shoulder injury x 2 weeks, experienced a "pop" when reaching over, since then experiencing pain and reduced mobility, will refer to ortho

## 2014-03-24 NOTE — Assessment & Plan Note (Signed)
Acute non traumatic left shoulder injury x 2 weeks, experienced a "pop" when reaching over, since then experiencing pain and reduced mobility, will refer to ortho

## 2014-03-24 NOTE — Assessment & Plan Note (Signed)

## 2014-03-27 LAB — CYTOLOGY - PAP

## 2014-03-30 ENCOUNTER — Other Ambulatory Visit: Payer: Self-pay | Admitting: Orthopedic Surgery

## 2014-03-30 DIAGNOSIS — M25512 Pain in left shoulder: Secondary | ICD-10-CM

## 2014-04-04 ENCOUNTER — Telehealth: Payer: Self-pay | Admitting: *Deleted

## 2014-04-04 ENCOUNTER — Other Ambulatory Visit: Payer: Self-pay

## 2014-04-04 DIAGNOSIS — J309 Allergic rhinitis, unspecified: Secondary | ICD-10-CM

## 2014-04-04 MED ORDER — MONTELUKAST SODIUM 10 MG PO TABS: 10.0000 mg | ORAL_TABLET | Freq: Every day | ORAL | Status: DC

## 2014-04-04 MED ORDER — VALSARTAN 80 MG PO TABS: ORAL_TABLET | ORAL | Status: DC

## 2014-04-04 MED ORDER — FLUTICASONE PROPIONATE 50 MCG/ACT NA SUSP: 2.0000 | Freq: Every day | NASAL | Status: DC

## 2014-04-04 MED ORDER — ROSUVASTATIN CALCIUM 10 MG PO TABS: ORAL_TABLET | ORAL | Status: DC

## 2014-04-04 NOTE — Telephone Encounter (Signed)
Pt called stating she took her last bp pill last night pt states [facility], [facility]. Pt states to please call her back if she does not answer to [facility]. Please advise [phone]

## 2014-04-05 NOTE — Telephone Encounter (Signed)
Spoke with patient and addressed refill needs.

## 2014-04-07 ENCOUNTER — Ambulatory Visit
Admission: RE | Admit: 2014-04-07 | Discharge: 2014-04-07 | Disposition: A | Discharge status: Home / Self Care | Payer: 59 | Source: Ambulatory Visit | Attending: Orthopedic Surgery | Admitting: Orthopedic Surgery

## 2014-04-07 DIAGNOSIS — M25512 Pain in left shoulder: Secondary | ICD-10-CM

## 2014-07-03 ENCOUNTER — Other Ambulatory Visit: Payer: Self-pay

## 2014-07-03 ENCOUNTER — Telehealth: Payer: Self-pay

## 2014-07-03 MED ORDER — ROSUVASTATIN CALCIUM 10 MG PO TABS: ORAL_TABLET | ORAL | Status: DC

## 2014-07-03 MED ORDER — VALSARTAN 80 MG PO TABS: ORAL_TABLET | ORAL | Status: DC

## 2014-07-03 NOTE — Telephone Encounter (Signed)
Refill sent to pharmacy.   

## 2014-07-03 NOTE — Telephone Encounter (Signed)
Meds sent to [facility]

## 2014-08-11 ENCOUNTER — Ambulatory Visit (INDEPENDENT_AMBULATORY_CARE_PROVIDER_SITE_OTHER): Payer: 59 | Admitting: Family Medicine

## 2014-08-11 VITALS — BP 130/82 | HR 95 | Temp 99.1°F | Resp 16 | Ht 67.0 in | Wt 196.0 lb

## 2014-08-11 DIAGNOSIS — R7309 Other abnormal glucose: Secondary | ICD-10-CM

## 2014-08-11 DIAGNOSIS — E669 Obesity, unspecified: Secondary | ICD-10-CM

## 2014-08-11 DIAGNOSIS — R7303 Prediabetes: Secondary | ICD-10-CM

## 2014-08-11 DIAGNOSIS — I10 Essential (primary) hypertension: Secondary | ICD-10-CM

## 2014-08-11 DIAGNOSIS — J302 Other seasonal allergic rhinitis: Secondary | ICD-10-CM

## 2014-08-11 MED ORDER — PROMETHAZINE-DM 6.25-15 MG/5ML PO SYRP: 5.0000 mL | ORAL_SOLUTION | Freq: Every evening | ORAL | Status: DC | PRN

## 2014-08-11 MED ORDER — BENZONATATE 100 MG PO CAPS: 100.0000 mg | ORAL_CAPSULE | Freq: Two times a day (BID) | ORAL | Status: DC | PRN

## 2014-08-11 MED ORDER — FLUTICASONE PROPIONATE 50 MCG/ACT NA SUSP: 2.0000 | Freq: Every day | NASAL | Status: AC

## 2014-08-11 MED ORDER — AZITHROMYCIN 250 MG PO TABS
ORAL_TABLET | ORAL | Status: DC
Start: 2014-08-11 — End: 2014-09-28

## 2014-08-11 MED ORDER — PREDNISONE 5 MG PO KIT: PACK | ORAL | Status: DC

## 2014-08-11 MED ORDER — AZITHROMYCIN 250 MG PO TABS: ORAL_TABLET | ORAL | Status: DC

## 2014-08-11 MED ORDER — METHYLPREDNISOLONE ACETATE 80 MG/ML IJ SUSP
80.0000 mg | Freq: Once | INTRAMUSCULAR | Status: AC
  Administered 2014-08-11: 80 mg via INTRAMUSCULAR

## 2014-08-11 NOTE — Patient Instructions (Addendum)
F/u as before  You are treated for uncontrolled allergies, depo medrol in office followed by a 6 day course of prednisone, start daily steroid nasal spray flonase, continue claritin daily, start twice daily saline nasal flushes  Phenergan DM at night as a cough suppressant also use your albuterol MDI at bedtime over the next 2 to 3 days  Z pack is prescribed, you do not need this now, BUT should you develop fever, chills, sinus pressure with green drainage or cough with green sputum , call in to notify and start the antibiotic  Return to work on 3/21 out today

## 2014-08-11 NOTE — Progress Notes (Signed)
Subjective:    Patient ID: [name], female    DOB: 26-Oct-1950, 64 y.o.   MRN: [id number]  HPI 2 day h/o excessive cough, and starts coughing spells after speaking for a short time Increased allergy symptoms this past weekend after being around a lot plants, intermittent chills, no documented fever. Prior to this was well and committed to healthy lifestyle   Review of Systems See HPI roat. Denies chest congestion, productive cough or wheezing. Denies chest pains, palpitations and leg swelling Denies abdominal pain, nausea, vomiting,diarrhea or constipation.   Denies dysuria, frequency, hesitancy or incontinence. Denies joint pain, swelling and limitation in mobility. Denies headaches, seizures, numbness, or tingling. Denies depression, anxiety or insomnia. Denies skin break down or rash.        Objective:   Physical Exam BP 130/82 mmHg  Pulse 95  Temp(Src) 99.1 F (37.3 C) (Oral)  Resp 16  Ht 5\' 7"  (1.702 m)  Wt 196 lb (88.905 kg)  BMI 30.69 kg/m2  SpO2 97% Patient alert and oriented and in no cardiopulmonary distress.However having coughing spells  HEENT: No facial asymmetry, EOMI,   oropharynx pink and moist.  Neck supple no JVD, no mass. Erythema and edema of nasal mucosa, sinuses non tender , TM clear bilaterally Chest: Clear to auscultation bilaterally.  CVS: S1, S2 no murmurs, no S3.Regular rate.  ABD: Soft non tender.   Ext: No edema  MS: Adequate ROM spine, shoulders, hips and knees.  Skin: Intact, no ulcerations or rash noted.  Psych: Good eye contact, normal affect. Memory intact not anxious or depressed appearing.  CNS: CN 2-12 intact, power,  normal throughout.no focal deficits noted.        Assessment & Plan:   Allergic rhinitis Uncontrolled.  Depo medrol administered IM in the office, to be followed by a short course of oral prednisone. Script provided for z pack  For use only if symptoms of infection develop    Essential  hypertension Controlled, no change in medication DASH diet and commitment to daily physical activity for a minimum of 30 minutes discussed and encouraged, as a part of hypertension management. The importance of attaining a healthy weight is also discussed.  BP/Weight 08/11/2014 03/24/2014 03/01/2014 08/29/2013 02/24/2013 10/21/2012 67/10/7207  Systolic BP 470 962 836 629 476 546 503  Diastolic BP 82 80 82 76 80 80 80  Wt. (Lbs) 196 196 191 195.04 197.04 197.12 198  BMI 30.69 30.69 29.91 30.54 30.85 30.87 31    CMP Latest Ref Rng 03/22/2014 05/16/2013 12/24/2011  Glucose 70 - 99 mg/dL 85 91 89  BUN 6 - 23 mg/dL 11 11 14   Creatinine 0.50 - 1.10 mg/dL 0.76 0.79 0.91  Sodium 135 - 145 mEq/L 139 141 138  Potassium 3.5 - 5.3 mEq/L 4.2 3.8 4.5  Chloride 96 - 112 mEq/L 101 107 103  CO2 19 - 32 mEq/L 27 24 20   Calcium 8.4 - 10.5 mg/dL 9.6 9.1 9.4  Total Protein 6.0 - 8.3 g/dL 7.7 7.0 7.1  Total Bilirubin 0.2 - 1.2 mg/dL 0.6 0.4 0.5  Alkaline Phos 39 - 117 U/L 93 77 93  AST 0 - 37 U/L 17 17 19   ALT 0 - 35 U/L 13 10 11         Obesity (BMI 30-39.9) Unchanged Patient re-educated about  the importance of commitment to a  minimum of 150 minutes of exercise per week.  The importance of healthy food choices with portion control discussed. Encouraged to start a  food diary, count calories and to consider  joining a support group. Sample diet sheets offered. Goals set by the patient for the next several months.   Wt Readings from Last 3 Encounters:  08/11/14 196 lb (88.905 kg)  03/24/14 196 lb (88.905 kg)  03/01/14 191 lb (86.637 kg)    Body mass index is 30.69 kg/(m^2).  Current exercise per week 150 minutes.    Prediabetes Updated lab needed at/ before next visit. Patient educated about the importance of limiting  Carbohydrate intake , the need to commit to daily physical activity for a minimum of 30 minutes , and to commit weight loss. The fact that changes in all these areas will  reduce or eliminate all together the development of diabetes is stressed.

## 2014-08-12 ENCOUNTER — Encounter: Payer: Self-pay | Admitting: Family Medicine

## 2014-08-12 NOTE — Assessment & Plan Note (Signed)
Updated lab needed at/ before next visit. Patient educated about the importance of limiting  Carbohydrate intake , the need to commit to daily physical activity for a minimum of 30 minutes , and to commit weight loss. The fact that changes in all these areas will reduce or eliminate all together the development of diabetes is stressed.    

## 2014-08-12 NOTE — Assessment & Plan Note (Signed)
Controlled, no change in medication DASH diet and commitment to daily physical activity for a minimum of 30 minutes discussed and encouraged, as a part of hypertension management. The importance of attaining a healthy weight is also discussed.  BP/Weight 08/11/2014 03/24/2014 03/01/2014 08/29/2013 02/24/2013 10/21/2012 93/11/9022  Systolic BP 097 353 299 242 683 419 622  Diastolic BP 82 80 82 76 80 80 80  Wt. (Lbs) 196 196 191 195.04 197.04 197.12 198  BMI 30.69 30.69 29.91 30.54 30.85 30.87 31    CMP Latest Ref Rng 03/22/2014 05/16/2013 12/24/2011  Glucose 70 - 99 mg/dL 85 91 89  BUN 6 - 23 mg/dL 11 11 14   Creatinine 0.50 - 1.10 mg/dL 0.76 0.79 0.91  Sodium 135 - 145 mEq/L 139 141 138  Potassium 3.5 - 5.3 mEq/L 4.2 3.8 4.5  Chloride 96 - 112 mEq/L 101 107 103  CO2 19 - 32 mEq/L 27 24 20   Calcium 8.4 - 10.5 mg/dL 9.6 9.1 9.4  Total Protein 6.0 - 8.3 g/dL 7.7 7.0 7.1  Total Bilirubin 0.2 - 1.2 mg/dL 0.6 0.4 0.5  Alkaline Phos 39 - 117 U/L 93 77 93  AST 0 - 37 U/L 17 17 19   ALT 0 - 35 U/L 13 10 11

## 2014-08-12 NOTE — Assessment & Plan Note (Addendum)
Uncontrolled.  Depo medrol administered IM in the office, to be followed by a short course of oral prednisone. Script provided for z pack  For use only if symptoms of infection develop

## 2014-08-12 NOTE — Assessment & Plan Note (Signed)
Unchanged Patient re-educated about  the importance of commitment to a  minimum of 150 minutes of exercise per week.  The importance of healthy food choices with portion control discussed. Encouraged to start a food diary, count calories and to consider  joining a support group. Sample diet sheets offered. Goals set by the patient for the next several months.   Wt Readings from Last 3 Encounters:  08/11/14 196 lb (88.905 kg)  03/24/14 196 lb (88.905 kg)  03/01/14 191 lb (86.637 kg)    Body mass index is 30.69 kg/(m^2).  Current exercise per week 150 minutes.

## 2014-09-15 ENCOUNTER — Encounter: Payer: Self-pay | Admitting: Family Medicine

## 2014-09-15 ENCOUNTER — Ambulatory Visit: Payer: Medicare HMO | Admitting: Family Medicine

## 2014-09-21 ENCOUNTER — Telehealth: Payer: Self-pay | Admitting: *Deleted

## 2014-09-21 NOTE — Telephone Encounter (Signed)
Pt called requesting a refill on her blood pressure pills because she gets them by mail order. Please advise

## 2014-09-22 MED ORDER — VALSARTAN 80 MG PO TABS: ORAL_TABLET | ORAL | Status: DC

## 2014-09-22 NOTE — Telephone Encounter (Signed)
Med refilled.

## 2014-09-28 ENCOUNTER — Ambulatory Visit (INDEPENDENT_AMBULATORY_CARE_PROVIDER_SITE_OTHER): Payer: 59 | Admitting: Family Medicine

## 2014-09-28 ENCOUNTER — Encounter: Payer: Self-pay | Admitting: Family Medicine

## 2014-09-28 VITALS — BP 124/80 | HR 78 | Resp 18 | Ht 67.0 in | Wt 195.0 lb

## 2014-09-28 DIAGNOSIS — K219 Gastro-esophageal reflux disease without esophagitis: Secondary | ICD-10-CM

## 2014-09-28 DIAGNOSIS — R7309 Other abnormal glucose: Secondary | ICD-10-CM

## 2014-09-28 DIAGNOSIS — Z23 Encounter for immunization: Secondary | ICD-10-CM | POA: Diagnosis not present

## 2014-09-28 DIAGNOSIS — R7303 Prediabetes: Secondary | ICD-10-CM

## 2014-09-28 DIAGNOSIS — I10 Essential (primary) hypertension: Secondary | ICD-10-CM

## 2014-09-28 DIAGNOSIS — J3089 Other allergic rhinitis: Secondary | ICD-10-CM | POA: Diagnosis not present

## 2014-09-28 DIAGNOSIS — Z1211 Encounter for screening for malignant neoplasm of colon: Secondary | ICD-10-CM

## 2014-09-28 DIAGNOSIS — E669 Obesity, unspecified: Secondary | ICD-10-CM

## 2014-09-28 DIAGNOSIS — Z1231 Encounter for screening mammogram for malignant neoplasm of breast: Secondary | ICD-10-CM | POA: Diagnosis not present

## 2014-09-28 DIAGNOSIS — E8881 Metabolic syndrome: Secondary | ICD-10-CM

## 2014-09-28 DIAGNOSIS — E785 Hyperlipidemia, unspecified: Secondary | ICD-10-CM

## 2014-09-28 MED ORDER — ROSUVASTATIN CALCIUM 10 MG PO TABS: ORAL_TABLET | ORAL | Status: DC

## 2014-09-28 MED ORDER — MOMETASONE FUROATE 50 MCG/ACT NA SUSP: 2.0000 | Freq: Every day | NASAL | Status: DC

## 2014-09-28 MED ORDER — VALSARTAN 80 MG PO TABS: ORAL_TABLET | ORAL | Status: DC

## 2014-09-28 MED ORDER — MONTELUKAST SODIUM 10 MG PO TABS: 10.0000 mg | ORAL_TABLET | Freq: Every day | ORAL | Status: DC

## 2014-09-28 NOTE — Patient Instructions (Signed)
Annual physical early November, call if you need me before  Zostavax today    You are referred for colonoscopy with Dr Laural Golden in September  It is important that you exercise regularly at least 30 minutes 7 times a week. If you develop chest pain, have severe difficulty breathing, or feel very tired, stop exercising immediately and seek medical attention   Half of the plate needs to be vegetables or fruit

## 2014-09-29 ENCOUNTER — Encounter (INDEPENDENT_AMBULATORY_CARE_PROVIDER_SITE_OTHER): Payer: Self-pay | Admitting: *Deleted

## 2014-10-07 DIAGNOSIS — Z23 Encounter for immunization: Secondary | ICD-10-CM | POA: Insufficient documentation

## 2014-10-07 NOTE — Assessment & Plan Note (Signed)
Hyperlipidemia:Low fat diet discussed and encouraged.   Lipid Panel  Lab Results  Component Value Date   CHOL 155 03/22/2014   HDL 58 03/22/2014   LDLCALC 83 03/22/2014   TRIG 70 03/22/2014   CHOLHDL 2.7 03/22/2014   Updated lab needed at/ before next visit.

## 2014-10-07 NOTE — Assessment & Plan Note (Signed)
Controlled, no change in medication DASH diet and commitment to daily physical activity for a minimum of 30 minutes discussed and encouraged, as a part of hypertension management. The importance of attaining a healthy weight is also discussed.  BP/Weight 09/28/2014 08/11/2014 03/24/2014 03/01/2014 08/29/2013 02/24/2013 11/11/120  Systolic BP 241 146 431 427 670 110 034  Diastolic BP 80 82 80 82 76 80 80  Wt. (Lbs) 195 196 196 191 195.04 197.04 197.12  BMI 30.53 30.69 30.69 29.91 30.54 30.85 30.87

## 2014-10-07 NOTE — Assessment & Plan Note (Signed)
Unchanged Patient re-educated about  the importance of commitment to a  minimum of 150 minutes of exercise per week.  The importance of healthy food choices with portion control discussed. Encouraged to start a food diary, count calories and to consider  joining a support group. Sample diet sheets offered. Goals set by the patient for the next several months.   Weight /BMI 09/28/2014 08/11/2014 03/24/2014  WEIGHT 195 lb 196 lb 196 lb  HEIGHT 5\' 7"  5\' 7"  5\' 7"   BMI 30.53 kg/m2 30.69 kg/m2 30.69 kg/m2    Current exercise per week 90 minutes.

## 2014-10-07 NOTE — Assessment & Plan Note (Signed)
Patient educated about the importance of limiting  Carbohydrate intake , the need to commit to daily physical activity for a minimum of 30 minutes , and to commit weight loss. The fact that changes in all these areas will reduce or eliminate all together the development of diabetes is stressed.  Updated lab needed at/ before next visit.   Diabetic Labs Latest Ref Rng 03/22/2014 05/16/2013 12/24/2011 12/19/2010 10/06/2007  HbA1c <5.7 % 6.1(H) 6.1(H) 6.0(H) 6.3(H) -  Chol 0 - 200 mg/dL 155 132 148 250(H) 228(H)  HDL >39 mg/dL 58 52 51 55 48  Calc LDL 0 - 99 mg/dL 83 68 83 167(H) 154(H)  Triglycerides <150 mg/dL 70 60 71 140 128  Creatinine 0.50 - 1.10 mg/dL 0.76 0.79 0.91 0.80 0.95   BP/Weight 09/28/2014 08/11/2014 03/24/2014 03/01/2014 08/29/2013 02/24/2013 7/41/6384  Systolic BP 536 468 032 122 482 500 370  Diastolic BP 80 82 80 82 76 80 80  Wt. (Lbs) 195 196 196 191 195.04 197.04 197.12  BMI 30.53 30.69 30.69 29.91 30.54 30.85 30.87   No flowsheet data found.

## 2014-10-07 NOTE — Assessment & Plan Note (Signed)
Controlled, no change in medication  

## 2014-10-07 NOTE — Assessment & Plan Note (Signed)
After obtaining informed consent, the vaccine is  administered by LPN.  

## 2014-10-07 NOTE — Assessment & Plan Note (Signed)
Though increased symptoms in the Spring, well controlled on current medicatrion

## 2014-10-07 NOTE — Assessment & Plan Note (Signed)
The increased risk of cardiovascular disease associated with this diagnosis, and the need to consistently work on lifestyle to change this is discussed. Following  a  heart healthy diet ,commitment to 30 minutes of exercise at least 5 days per week, as well as control of blood sugar and cholesterol , and achieving a healthy weight are all the areas to be addressed .  

## 2014-10-07 NOTE — Progress Notes (Signed)
[name]     MRN: [id number]      DOB: [birth year]   HPI [name] is here for follow up and re-evaluation of chronic medical conditions, medication management and review of any available recent lab and radiology data.  Preventive health is updated, specifically  Cancer screening and Immunization.   Questions or concerns regarding consultations or procedures which the PT has had in the interim are  addressed. The PT denies any adverse reactions to current medications since the last visit.  There are no new concerns.  There are no specific complaints   ROS Denies recent fever or chills. Denies sinus pressure, nasal congestion, ear pain or sore throat. Denies chest congestion, productive cough or wheezing. Denies chest pains, palpitations and leg swelling Denies abdominal pain, nausea, vomiting,diarrhea or constipation.   Denies dysuria, frequency, hesitancy or incontinence. Denies joint pain, swelling and limitation in mobility. Denies headaches, seizures, numbness, or tingling. Denies depression, anxiety or insomnia. Denies skin break down or rash.   PE  BP 124/80 mmHg  Pulse 78  Resp 18  Ht 5\' 7"  (1.702 m)  Wt 195 lb (88.451 kg)  BMI 30.53 kg/m2  SpO2 98%  Patient alert and oriented and in no cardiopulmonary distress.  HEENT: No facial asymmetry, EOMI,   oropharynx pink and moist.  Neck supple no JVD, no mass.  Chest: Clear to auscultation bilaterally.  CVS: S1, S2 no murmurs, no S3.Regular rate.  ABD: Soft non tender.   Ext: No edema  MS: Adequate ROM spine, shoulders, hips and knees.  Skin: Intact, no ulcerations or rash noted.  Psych: Good eye contact, normal affect. Memory intact not anxious or depressed appearing.  CNS: CN 2-12 intact, power,  normal throughout.no focal deficits noted.   Assessment & Plan   Essential hypertension Controlled, no change in medication DASH diet and commitment to daily physical activity for a minimum of 30  minutes discussed and encouraged, as a part of hypertension management. The importance of attaining a healthy weight is also discussed.  BP/Weight 09/28/2014 08/11/2014 03/24/2014 03/01/2014 08/29/2013 02/24/2013 1/85/6314  Systolic BP 970 263 785 885 027 741 287  Diastolic BP 80 82 80 82 76 80 80  Wt. (Lbs) 195 196 196 191 195.04 197.04 197.12  BMI 30.53 30.69 30.69 29.91 30.54 30.85 30.87         Allergic rhinitis Though increased symptoms in the Spring, well controlled on current medicatrion   Prediabetes Patient educated about the importance of limiting  Carbohydrate intake , the need to commit to daily physical activity for a minimum of 30 minutes , and to commit weight loss. The fact that changes in all these areas will reduce or eliminate all together the development of diabetes is stressed.  Updated lab needed at/ before next visit.   Diabetic Labs Latest Ref Rng 03/22/2014 05/16/2013 12/24/2011 12/19/2010 10/06/2007  HbA1c <5.7 % 6.1(H) 6.1(H) 6.0(H) 6.3(H) -  Chol 0 - 200 mg/dL 155 132 148 250(H) 228(H)  HDL >39 mg/dL 58 52 51 55 48  Calc LDL 0 - 99 mg/dL 83 68 83 167(H) 154(H)  Triglycerides <150 mg/dL 70 60 71 140 128  Creatinine 0.50 - 1.10 mg/dL 0.76 0.79 0.91 0.80 0.95   BP/Weight 09/28/2014 08/11/2014 03/24/2014 03/01/2014 08/29/2013 02/24/2013 8/67/6720  Systolic BP 947 096 283 662 947 654 650  Diastolic BP 80 82 80 82 76 80 80  Wt. (Lbs) 195 196 196 191 195.04 197.04 197.12  BMI 30.53 30.69 30.69 29.91  30.54 30.85 30.87   No flowsheet data found.      Hyperlipidemia LDL goal <100 Hyperlipidemia:Low fat diet discussed and encouraged.   Lipid Panel  Lab Results  Component Value Date   CHOL 155 03/22/2014   HDL 58 03/22/2014   LDLCALC 83 03/22/2014   TRIG 70 03/22/2014   CHOLHDL 2.7 03/22/2014   Updated lab needed at/ before next visit.       Metabolic syndrome X The increased risk of cardiovascular disease associated with this diagnosis, and the need  to consistently work on lifestyle to change this is discussed. Following  a  heart healthy diet ,commitment to 30 minutes of exercise at least 5 days per week, as well as control of blood sugar and cholesterol , and achieving a healthy weight are all the areas to be addressed .    Obesity (BMI 30-39.9) Unchanged Patient re-educated about  the importance of commitment to a  minimum of 150 minutes of exercise per week.  The importance of healthy food choices with portion control discussed. Encouraged to start a food diary, count calories and to consider  joining a support group. Sample diet sheets offered. Goals set by the patient for the next several months.   Weight /BMI 09/28/2014 08/11/2014 03/24/2014  WEIGHT 195 lb 196 lb 196 lb  HEIGHT 5\' 7"  5\' 7"  5\' 7"   BMI 30.53 kg/m2 30.69 kg/m2 30.69 kg/m2    Current exercise per week 90 minutes.    Need for Zostavax administration After obtaining informed consent, the vaccine is  administered by LPN.    GERD (gastroesophageal reflux disease) Controlled, no change in medication

## 2014-10-26 ENCOUNTER — Other Ambulatory Visit: Payer: Self-pay | Admitting: Family Medicine

## 2014-11-29 ENCOUNTER — Telehealth: Payer: Self-pay

## 2014-11-29 DIAGNOSIS — H5789 Other specified disorders of eye and adnexa: Secondary | ICD-10-CM

## 2014-11-29 NOTE — Telephone Encounter (Signed)
5 day h/o both eyes red and irritated, she has had iritis in the past requested a refill on steroid eye drop, I explained to pt I am not qualified to prescribe steroid eye drops,  She denies drainage from the eye or loss of vision States she ahs tried several offices and has been unable to get an apptwith an eye specialist  I have advised first thing in the am referral will be made and calls to several offices in the hope that she can get an appt within the next 48 hrs, now using refresh tears, she verbalizes understanding  Pls call her with appt info once completed. She has seen Dr Iona Hansen in the past and he is often very accommodating , she may also collect her empty bottle if she wishes

## 2014-11-30 ENCOUNTER — Encounter: Payer: Self-pay | Admitting: Family Medicine

## 2014-11-30 NOTE — Telephone Encounter (Signed)
Appointment today 7.7.16 at 10:30 with Dr Iona Hansen patient is aware

## 2015-02-05 ENCOUNTER — Ambulatory Visit (HOSPITAL_COMMUNITY): Payer: 59

## 2015-04-05 ENCOUNTER — Telehealth: Payer: Self-pay | Admitting: Family Medicine

## 2015-04-05 MED ORDER — VALSARTAN 80 MG PO TABS: ORAL_TABLET | ORAL | Status: DC

## 2015-04-05 NOTE — Telephone Encounter (Signed)
1 time refill sent in

## 2015-04-05 NOTE — Telephone Encounter (Signed)
Patient is asking for a 1 time refill on her BP medication valsartan (DIOVAN) 80 MG tablet sent to [facility] in [place] until her appointment 04/12/15, please advise?

## 2015-04-11 ENCOUNTER — Other Ambulatory Visit: Payer: Self-pay | Admitting: Family Medicine

## 2015-04-11 LAB — CBC
HEMATOCRIT: 42.3 % (ref 36.0–46.0)
HEMOGLOBIN: 13.9 g/dL (ref 12.0–15.0)
MCH: 27.5 pg (ref 26.0–34.0)
MCHC: 32.9 g/dL (ref 30.0–36.0)
MCV: 83.8 fL (ref 78.0–100.0)
MPV: 10.5 fL (ref 8.6–12.4)
Platelets: 268 10*3/uL (ref 150–400)
RBC: 5.05 MIL/uL (ref 3.87–5.11)
RDW: 14.8 % (ref 11.5–15.5)
WBC: 7.5 10*3/uL (ref 4.0–10.5)

## 2015-04-11 LAB — HEMOGLOBIN A1C
Hgb A1c MFr Bld: 6 % — ABNORMAL HIGH (ref <5.7)
MEAN PLASMA GLUCOSE: 126 mg/dL — AB (ref <117)

## 2015-04-11 LAB — COMPLETE METABOLIC PANEL WITH GFR
ALT: 11 U/L (ref 6–29)
AST: 14 U/L (ref 10–35)
Albumin: 3.9 g/dL (ref 3.6–5.1)
Alkaline Phosphatase: 86 U/L (ref 33–130)
BUN: 15 mg/dL (ref 7–25)
CHLORIDE: 104 mmol/L (ref 98–110)
CO2: 25 mmol/L (ref 20–31)
Calcium: 9.2 mg/dL (ref 8.6–10.4)
Creat: 0.86 mg/dL (ref 0.50–0.99)
GFR, Est African American: 84 mL/min (ref >=60)
GFR, Est Non African American: 73 mL/min (ref >=60)
GLUCOSE: 86 mg/dL (ref 65–99)
POTASSIUM: 4.2 mmol/L (ref 3.5–5.3)
Sodium: 136 mmol/L (ref 135–146)
Total Bilirubin: 0.4 mg/dL (ref 0.2–1.2)
Total Protein: 7.2 g/dL (ref 6.1–8.1)

## 2015-04-11 LAB — LIPID PANEL
Cholesterol: 203 mg/dL — ABNORMAL HIGH (ref 125–200)
HDL: 42 mg/dL — AB (ref >=46)
LDL CALC: 138 mg/dL — AB (ref <130)
TRIGLYCERIDES: 115 mg/dL (ref <150)
Total CHOL/HDL Ratio: 4.8 Ratio (ref <=5.0)
VLDL: 23 mg/dL (ref <30)

## 2015-04-11 LAB — TSH: TSH: 0.803 u[IU]/mL (ref 0.350–4.500)

## 2015-04-12 ENCOUNTER — Encounter: Payer: Self-pay | Admitting: Family Medicine

## 2015-04-12 ENCOUNTER — Ambulatory Visit (INDEPENDENT_AMBULATORY_CARE_PROVIDER_SITE_OTHER): Payer: 59 | Admitting: Family Medicine

## 2015-04-12 VITALS — BP 136/82 | HR 96 | Resp 16 | Ht 68.0 in | Wt 198.0 lb

## 2015-04-12 DIAGNOSIS — Z0000 Encounter for general adult medical examination without abnormal findings: Secondary | ICD-10-CM

## 2015-04-12 DIAGNOSIS — E785 Hyperlipidemia, unspecified: Secondary | ICD-10-CM

## 2015-04-12 DIAGNOSIS — M069 Rheumatoid arthritis, unspecified: Secondary | ICD-10-CM

## 2015-04-12 DIAGNOSIS — H15103 Unspecified episcleritis, bilateral: Secondary | ICD-10-CM | POA: Diagnosis not present

## 2015-04-12 DIAGNOSIS — Z1211 Encounter for screening for malignant neoplasm of colon: Secondary | ICD-10-CM

## 2015-04-12 DIAGNOSIS — R7303 Prediabetes: Secondary | ICD-10-CM | POA: Diagnosis not present

## 2015-04-12 HISTORY — DX: Encounter for general adult medical examination without abnormal findings: Z00.00

## 2015-04-12 MED ORDER — PRAVASTATIN SODIUM 10 MG PO TABS: 10.0000 mg | ORAL_TABLET | Freq: Every day | ORAL | Status: DC

## 2015-04-12 NOTE — Assessment & Plan Note (Signed)
Uncontrolled, off crestor, start pravachol

## 2015-04-12 NOTE — Assessment & Plan Note (Signed)

## 2015-04-12 NOTE — Progress Notes (Signed)
   Subjective:    Patient ID: [name], female    DOB: [birth year], 64 y.o.   MRN: [id number]  HPI Patient is in for annual physical exam. No other health concerns are expressed or addressed at the visit. Recent labs, if available are reviewed. Immunization is reviewed , and  updated if needed.    Review of Systems See HPI     Objective:   Physical Exam  BP 136/82 mmHg  Pulse 96  Resp 16  Ht 5\' 8"  (1.727 m)  Wt 198 lb (89.812 kg)  BMI 30.11 kg/m2  SpO2 99%  Pleasant well nourished female, alert and oriented x 3, in no cardio-pulmonary distress. Afebrile. HEENT No facial trauma or asymetry. Sinuses non tender.  Extra occullar muscles intact, pupils equally reactive to light. External ears normal, tympanic membranes clear. Oropharynx moist, no exudate, good dentition. Neck: supple, no adenopathy,JVD or thyromegaly.No bruits.  Chest: Clear to ascultation bilaterally.No crackles or wheezes. Non tender to palpation  Breast: No asymetry,no masses or lumps. No tenderness. No nipple discharge or inversion. No axillary or supraclavicular adenopathy  Cardiovascular system; Heart sounds normal,  S1 and  S2 ,no S3.  No murmur, or thrill. Apical beat not displaced Peripheral pulses normal.  Abdomen: Soft, non tender, no organomegaly or masses. No bruits. Bowel sounds normal. No guarding, tenderness or rebound.  Rectal:  Normal sphincter tone. No mass.No rectal masses.  Guaiac negative stool.  GU: External genitalia normal female genitalia , female distribution of hair. No lesions. Urethral meatus normal in size, no  Prolapse, no lesions visibly  Present. Bladder non tender. Vagina pink and moist , with no visible lesions , discharge present . Adequate pelvic support no  cystocele or rectocele noted Cervix pink and appears healthy, no lesions or ulcerations noted, no discharge noted from os Uterus normal size, no adnexal masses, no cervical motion or adnexal  tenderness.   Musculoskeletal exam: Full ROM of spine, hips , shoulders and knees. No deformity ,swelling or crepitus noted. No muscle wasting or atrophy.   Neurologic: Cranial nerves 2 to 12 intact. Power, tone ,sensation and reflexes normal throughout. No disturbance in gait. No tremor.  Skin: Intact, no ulceration, erythema , scaling or rash noted. Pigmentation normal throughout  Psych; Normal mood and affect. Judgement and concentration normal       Assessment & Plan:  Hyperlipidemia LDL goal <100 Uncontrolled, off crestor, start pravachol  Annual physical exam Annual exam as documented. Counseling done  re healthy lifestyle involving commitment to 150 minutes exercise per week, heart healthy diet, and attaining healthy weight.The importance of adequate sleep also discussed. Regular seat belt use and home safety, is also discussed. Changes in health habits are decided on by the patient with goals and time frames  set for achieving them. Immunization and cancer screening needs are specifically addressed at this visit.   Episcleritis of both eyes Initially diagnosed and treated by Dr Iona Hansen in 11/2014. Ongoing need for steroid drops is of concern, and rheumatology re evaluation recommended, as she ahs dx of rheumatoid arthritis, will refer to rheumatology for re evaluation  Special screening for malignant neoplasms, colon Heme negative stool, no palpable mass, still needs colonoscopy , and intends to get before end of this year reportedly

## 2015-04-12 NOTE — Patient Instructions (Addendum)
F/u in 5 month, call if you need me before  PLS schedule and get your colonoscopy and mammogram , both are due and needed!  New cholesterol med pravachol, in  Place of crestor  Blood sugar improved, but still above normal, so please continue to work on this  HBA1C, fasting cmp and EGFR in 5 month  You are referred to Dr Truslow for eval of autoimmune disease  Thanks for choosing [facility], we consider it a privelige to serve you.    

## 2015-04-13 ENCOUNTER — Encounter: Payer: Self-pay | Admitting: Family Medicine

## 2015-04-13 DIAGNOSIS — H15103 Unspecified episcleritis, bilateral: Secondary | ICD-10-CM | POA: Insufficient documentation

## 2015-04-13 DIAGNOSIS — Z0000 Encounter for general adult medical examination without abnormal findings: Secondary | ICD-10-CM | POA: Insufficient documentation

## 2015-04-13 LAB — POC HEMOCCULT BLD/STL (OFFICE/1-CARD/DIAGNOSTIC): Fecal Occult Blood, POC: NEGATIVE

## 2015-04-13 LAB — HIV ANTIBODY (ROUTINE TESTING W REFLEX): HIV: NONREACTIVE

## 2015-04-13 NOTE — Addendum Note (Signed)
Addended by: Eual Fines on: 04/13/2015 09:01 AM   Modules accepted: Orders

## 2015-04-13 NOTE — Assessment & Plan Note (Signed)
Heme negative stool, no palpable mass, still needs colonoscopy , and intends to get before end of this year reportedly

## 2015-04-13 NOTE — Assessment & Plan Note (Signed)
Initially diagnosed and treated by Dr Iona Hansen in 11/2014. Ongoing need for steroid drops is of concern, and rheumatology re evaluation recommended, as she ahs dx of rheumatoid arthritis, will refer to rheumatology for re evaluation

## 2015-04-27 ENCOUNTER — Telehealth: Payer: Self-pay | Admitting: Family Medicine

## 2015-04-27 MED ORDER — VALSARTAN 80 MG PO TABS: ORAL_TABLET | ORAL | Status: DC

## 2015-04-27 NOTE — Telephone Encounter (Signed)
Med refill sent in.

## 2015-04-27 NOTE — Telephone Encounter (Signed)
Patient is requesting a refill on valsartan (DIOVAN) 80 MG tablet to go to mail order [employer]

## 2015-09-11 ENCOUNTER — Ambulatory Visit: Payer: 59 | Admitting: Family Medicine

## 2015-10-11 ENCOUNTER — Other Ambulatory Visit: Payer: Self-pay | Admitting: Family Medicine

## 2015-10-26 ENCOUNTER — Other Ambulatory Visit: Payer: Self-pay | Admitting: Family Medicine

## 2015-10-26 DIAGNOSIS — Z1231 Encounter for screening mammogram for malignant neoplasm of breast: Secondary | ICD-10-CM

## 2015-10-29 ENCOUNTER — Ambulatory Visit (HOSPITAL_COMMUNITY)
Admission: RE | Admit: 2015-10-29 | Discharge: 2015-10-29 | Disposition: A | Discharge status: Home / Self Care | Payer: Managed Care, Other (non HMO) | Source: Ambulatory Visit | Attending: Family Medicine | Admitting: Family Medicine

## 2015-10-29 DIAGNOSIS — Z1231 Encounter for screening mammogram for malignant neoplasm of breast: Secondary | ICD-10-CM | POA: Insufficient documentation

## 2015-11-02 ENCOUNTER — Other Ambulatory Visit: Payer: Self-pay | Admitting: Family Medicine

## 2015-11-02 DIAGNOSIS — R928 Other abnormal and inconclusive findings on diagnostic imaging of breast: Secondary | ICD-10-CM

## 2015-11-05 ENCOUNTER — Telehealth: Payer: Self-pay

## 2015-11-05 DIAGNOSIS — I10 Essential (primary) hypertension: Secondary | ICD-10-CM

## 2015-11-05 DIAGNOSIS — E785 Hyperlipidemia, unspecified: Secondary | ICD-10-CM

## 2015-11-05 DIAGNOSIS — R7303 Prediabetes: Secondary | ICD-10-CM

## 2015-11-05 NOTE — Telephone Encounter (Signed)
Labs ordered and mailed appt card

## 2015-11-14 ENCOUNTER — Ambulatory Visit
Admission: RE | Admit: 2015-11-14 | Discharge: 2015-11-14 | Disposition: A | Discharge status: Home / Self Care | Payer: Managed Care, Other (non HMO) | Source: Ambulatory Visit | Attending: Family Medicine | Admitting: Family Medicine

## 2015-11-14 DIAGNOSIS — R928 Other abnormal and inconclusive findings on diagnostic imaging of breast: Secondary | ICD-10-CM

## 2015-11-15 ENCOUNTER — Encounter: Payer: Self-pay | Admitting: Family Medicine

## 2015-11-15 ENCOUNTER — Encounter (INDEPENDENT_AMBULATORY_CARE_PROVIDER_SITE_OTHER): Payer: Self-pay | Admitting: *Deleted

## 2015-11-15 ENCOUNTER — Ambulatory Visit (INDEPENDENT_AMBULATORY_CARE_PROVIDER_SITE_OTHER): Payer: Managed Care, Other (non HMO) | Admitting: Family Medicine

## 2015-11-15 VITALS — BP 120/80 | HR 90 | Resp 16 | Ht 68.0 in | Wt 200.1 lb

## 2015-11-15 DIAGNOSIS — Z1211 Encounter for screening for malignant neoplasm of colon: Secondary | ICD-10-CM | POA: Diagnosis not present

## 2015-11-15 DIAGNOSIS — E785 Hyperlipidemia, unspecified: Secondary | ICD-10-CM | POA: Diagnosis not present

## 2015-11-15 DIAGNOSIS — R7303 Prediabetes: Secondary | ICD-10-CM

## 2015-11-15 DIAGNOSIS — I10 Essential (primary) hypertension: Secondary | ICD-10-CM | POA: Diagnosis not present

## 2015-11-15 DIAGNOSIS — J3089 Other allergic rhinitis: Secondary | ICD-10-CM | POA: Diagnosis not present

## 2015-11-15 DIAGNOSIS — E669 Obesity, unspecified: Secondary | ICD-10-CM

## 2015-11-15 DIAGNOSIS — E8881 Metabolic syndrome: Secondary | ICD-10-CM

## 2015-11-15 DIAGNOSIS — F5102 Adjustment insomnia: Secondary | ICD-10-CM

## 2015-11-15 LAB — HEMOGLOBIN A1C
HEMOGLOBIN A1C: 6 % — AB (ref <5.7)
Mean Plasma Glucose: 126 mg/dL

## 2015-11-15 MED ORDER — ALPRAZOLAM 0.25 MG PO TABS: ORAL_TABLET | ORAL | Status: DC

## 2015-11-15 NOTE — Patient Instructions (Signed)
Annual physical exam in November , call if you need me sooner  Excellent BP, thankful mammogram is good  You are referred for colonoscopy in September with Dr Laural Golden.  Hope your Mom's health improves  Low dose xanax for bedtime use for limited time only , to help with current steress and anxiety so that you can sleep  Try the crestor that you have received, if problems please stiop and let us know  Thank you  for choosing [facility]. We consider it a privelige to serve you.  Delivering excellent health care in a caring and  compassionate way is our goal.  Partnering with you,  so that together we can achieve this goal is our strategy.

## 2015-11-16 ENCOUNTER — Encounter: Payer: Self-pay | Admitting: Family Medicine

## 2015-11-16 DIAGNOSIS — F5102 Adjustment insomnia: Secondary | ICD-10-CM | POA: Insufficient documentation

## 2015-11-16 LAB — COMPLETE METABOLIC PANEL WITH GFR
ALBUMIN: 3.6 g/dL (ref 3.6–5.1)
ALK PHOS: 86 U/L (ref 33–130)
ALT: 11 U/L (ref 6–29)
AST: 14 U/L (ref 10–35)
BUN: 15 mg/dL (ref 7–25)
CALCIUM: 8.7 mg/dL (ref 8.6–10.4)
CHLORIDE: 103 mmol/L (ref 98–110)
CO2: 27 mmol/L (ref 20–31)
Creat: 0.83 mg/dL (ref 0.50–0.99)
GFR, EST NON AFRICAN AMERICAN: 76 mL/min (ref >=60)
GFR, Est African American: 87 mL/min (ref >=60)
Glucose, Bld: 84 mg/dL (ref 65–99)
POTASSIUM: 4.3 mmol/L (ref 3.5–5.3)
SODIUM: 140 mmol/L (ref 135–146)
Total Bilirubin: 0.5 mg/dL (ref 0.2–1.2)
Total Protein: 6.8 g/dL (ref 6.1–8.1)

## 2015-11-16 LAB — LIPID PANEL
CHOL/HDL RATIO: 3.8 ratio (ref <=5.0)
CHOLESTEROL: 197 mg/dL (ref 125–200)
HDL: 52 mg/dL (ref >=46)
LDL Cholesterol: 129 mg/dL (ref <130)
Triglycerides: 78 mg/dL (ref <150)
VLDL: 16 mg/dL (ref <30)

## 2015-11-16 NOTE — Assessment & Plan Note (Signed)
Patient educated about the importance of limiting  Carbohydrate intake , the need to commit to daily physical activity for a minimum of 30 minutes , and to commit weight loss. The fact that changes in all these areas will reduce or eliminate all together the development of diabetes is stressed.   Diabetic Labs Latest Ref Rng 11/15/2015 04/11/2015 03/22/2014 05/16/2013 12/24/2011  HbA1c <5.7 % 6.0(H) 6.0(H) 6.1(H) 6.1(H) 6.0(H)  Chol 125 - 200 mg/dL 197 203(H) 155 132 148  HDL >=46 mg/dL 52 42(L) 58 52 51  Calc LDL <130 mg/dL 129 138(H) 83 68 83  Triglycerides <150 mg/dL 78 115 70 60 71  Creatinine 0.50 - 0.99 mg/dL 0.83 0.86 0.76 0.79 0.91   BP/Weight 11/15/2015 04/12/2015 09/28/2014 08/11/2014 03/24/2014 [id number]  Systolic BP 123456 [id number]  Diastolic BP 80 82 80 82 80 82 76  Wt. (Lbs) 200.12 198 195 196 196 191 195.04  BMI 30.44 30.11 30.53 30.69 30.69 29.91 30.54   No flowsheet data found. unchnaged

## 2015-11-16 NOTE — Assessment & Plan Note (Signed)
The increased risk of cardiovascular disease associated with this diagnosis, and the need to consistently work on lifestyle to change this is discussed. Following  a  heart healthy diet ,commitment to 30 minutes of exercise at least 5 days per week, as well as control of blood sugar and cholesterol , and achieving a healthy weight are all the areas to be addressed .  

## 2015-11-16 NOTE — Assessment & Plan Note (Signed)
Controlled, and improved Hyperlipidemia:Low fat diet discussed and encouraged.   Lipid Panel  Lab Results  Component Value Date   CHOL 197 11/15/2015   HDL 52 11/15/2015   LDLCALC 129 11/15/2015   TRIG 78 11/15/2015   CHOLHDL 3.8 [id number]   Need to ascertain medication she just received and will start, tho reports that crestor caused stomach cramps in the past , states she recently received a shipment of this med, she will try to use it, and call with problems    .

## 2015-11-16 NOTE — Assessment & Plan Note (Signed)
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 Limited quantity of xanax to help with sleep and anxiety short term prescribed, she reports no sleep for past 3 days. Does not wish to get FMLA at this time, but is aware of the option

## 2015-11-16 NOTE — Progress Notes (Signed)
[name]     MRN: [id number]      DOB: Oct 07, 1950   HPI [name] is here for follow up and re-evaluation of chronic medical conditions, medication management and review of any available recent lab and radiology data.  Preventive health is updated, specifically  Cancer screening and Immunization.   Questions or concerns regarding consultations or procedures which the PT has had in the interim are  addressed. The PT denies any adverse reactions to current medications since the last visit.  Has been under increased stress and anxiety this past week, as her Mother is currently in a SNF due to UTI and acute confusion, which is new for her and may be due to xanax which she was recently given. Pt is tearful, nervous , worried , especially as her sisters who All love her mom and are very close keep looking to her for answers and taking/ assigning blame for her Mom's condition Denies recent fever or chills. Denies sinus pressure, nasal congestion, ear pain or sore throat. Denies chest congestion, productive cough or wheezing. Denies chest pains, palpitations and leg swelling Denies abdominal pain, nausea, vomiting,diarrhea or constipation.   Denies dysuria, frequency, hesitancy or incontinence. Denies joint pain, swelling and limitation in mobility. Denies headaches, seizures, numbness, or tingling. . Denies skin break down or rash.   PE  BP 120/80 mmHg  Pulse 90  Resp 16  Ht 5\' 8"  (1.727 m)  Wt 200 lb 1.9 oz (90.774 kg)  BMI 30.44 kg/m2  SpO2 99%  Patient alert and oriented and in no cardiopulmonary distress.  HEENT: No facial asymmetry, EOMI,   oropharynx pink and moist.  Neck supple no JVD, no mass.  Chest: Clear to auscultation bilaterally.  CVS: S1, S2 no murmurs, no S3.Regular rate.  ABD: Soft non tender.   Ext: No edema  MS: Adequate ROM spine, shoulders, hips and knees.  Skin: Intact, no ulcerations or rash noted.  Psych: Good eye contact, normal affect.  Memory intact  anxious , mildly depressed appearing.  CNS: CN 2-12 intact, power,  normal throughout.no focal deficits noted.   Assessment & Plan   Essential hypertension Controlled, no change in medication DASH diet and commitment to daily physical activity for a minimum of 30 minutes discussed and encouraged, as a part of hypertension management. The importance of attaining a healthy weight is also discussed.  BP/Weight 11/15/2015 04/12/2015 09/28/2014 08/11/2014 03/24/2014 [id number]  Systolic BP 123456 [id number]  Diastolic BP 80 82 80 82 80 82 76  Wt. (Lbs) 200.12 198 195 196 196 191 195.04  BMI 30.44 30.11 30.53 30.69 30.69 29.91 30.54        Hyperlipidemia LDL goal <100 Controlled, and improved Hyperlipidemia:Low fat diet discussed and encouraged.   Lipid Panel  Lab Results  Component Value Date   CHOL 197 11/15/2015   HDL 52 11/15/2015   LDLCALC 129 11/15/2015   TRIG 78 11/15/2015   CHOLHDL 3.8 [id number]   Need to ascertain medication she just received and will start, tho reports that crestor caused stomach cramps in the past , states she recently received a shipment of this med, she will try to use it, and call with problems    .  Allergic rhinitis Controlled, no change in medication   Obesity (BMI 30-39.9) unchnaged Patient re-educated about  the importance of commitment to a  minimum of 150 minutes of exercise per week.  The importance of healthy food choices with  portion control discussed. Encouraged to start a food diary, count calories and to consider  joining a support group. Sample diet sheets offered. Goals set by the patient for the next several months.   Weight /BMI 11/15/2015 04/12/2015 09/28/2014  WEIGHT 200 lb 1.9 oz 198 lb 195 lb  HEIGHT 5\' 8"  5\' 8"  5\' 7"   BMI 30.44 kg/m2 30.11 kg/m2 30.53 kg/m2    Current exercise per week 60 minutes.   Insomnia due to stress Pt verbalized her anxiety , became tearful, also reported  situation on the job recently where she had to remind jer Supervisor that her Mother's health was "more important than the job". Limited quantity of xanax to help with sleep and anxiety short term prescribed, she reports no sleep for past 3 days. Does not wish to get FMLA at this time, but is aware of the option   Prediabetes Patient educated about the importance of limiting  Carbohydrate intake , the need to commit to daily physical activity for a minimum of 30 minutes , and to commit weight loss. The fact that changes in all these areas will reduce or eliminate all together the development of diabetes is stressed.   Diabetic Labs Latest Ref Rng 11/15/2015 04/11/2015 03/22/2014 05/16/2013 12/24/2011  HbA1c <5.7 % 6.0(H) 6.0(H) 6.1(H) 6.1(H) 6.0(H)  Chol 125 - 200 mg/dL 197 203(H) 155 132 148  HDL >=46 mg/dL 52 42(L) 58 52 51  Calc LDL <130 mg/dL 129 138(H) 83 68 83  Triglycerides <150 mg/dL 78 115 70 60 71  Creatinine 0.50 - 0.99 mg/dL 0.83 0.86 0.76 0.79 0.91   BP/Weight 11/15/2015 04/12/2015 09/28/2014 08/11/2014 03/24/2014 [id number]  Systolic BP 123456 [id number]  Diastolic BP 80 82 80 82 80 82 76  Wt. (Lbs) 200.12 198 195 196 196 191 195.04  BMI 30.44 30.11 30.53 30.69 30.69 29.91 30.54   No flowsheet data found. unchnaged    Metabolic syndrome X The increased risk of cardiovascular disease associated with this diagnosis, and the need to consistently work on lifestyle to change this is discussed. Following  a  heart healthy diet ,commitment to 30 minutes of exercise at least 5 days per week, as well as control of blood sugar and cholesterol , and achieving a healthy weight are all the areas to be addressed .

## 2015-11-16 NOTE — Assessment & Plan Note (Signed)
Controlled, no change in medication DASH diet and commitment to daily physical activity for a minimum of 30 minutes discussed and encouraged, as a part of hypertension management. The importance of attaining a healthy weight is also discussed.  BP/Weight 11/15/2015 04/12/2015 09/28/2014 08/11/2014 03/24/2014 [id number]  Systolic BP 123456 [id number]  Diastolic BP 80 82 80 82 80 82 76  Wt. (Lbs) 200.12 198 195 196 196 191 195.04  BMI 30.44 30.11 30.53 30.69 30.69 29.91 30.54

## 2015-11-16 NOTE — Assessment & Plan Note (Signed)
unchnaged Patient re-educated about  the importance of commitment to a  minimum of 150 minutes of exercise per week.  The importance of healthy food choices with portion control discussed. Encouraged to start a food diary, count calories and to consider  joining a support group. Sample diet sheets offered. Goals set by the patient for the next several months.   Weight /BMI 11/15/2015 04/12/2015 09/28/2014  WEIGHT 200 lb 1.9 oz 198 lb 195 lb  HEIGHT 5\' 8"  5\' 8"  5\' 7"   BMI 30.44 kg/m2 30.11 kg/m2 30.53 kg/m2    Current exercise per week 60 minutes.

## 2015-11-16 NOTE — Assessment & Plan Note (Signed)
Controlled, no change in medication  

## 2015-12-29 ENCOUNTER — Other Ambulatory Visit: Payer: Self-pay | Admitting: Family Medicine

## 2016-01-16 ENCOUNTER — Ambulatory Visit (INDEPENDENT_AMBULATORY_CARE_PROVIDER_SITE_OTHER): Payer: Managed Care, Other (non HMO) | Admitting: Family Medicine

## 2016-01-16 ENCOUNTER — Encounter: Payer: Self-pay | Admitting: Family Medicine

## 2016-01-16 VITALS — BP 130/82 | HR 91 | Temp 98.9°F | Resp 16 | Ht 68.0 in | Wt 199.0 lb

## 2016-01-16 DIAGNOSIS — I10 Essential (primary) hypertension: Secondary | ICD-10-CM

## 2016-01-16 DIAGNOSIS — F5102 Adjustment insomnia: Secondary | ICD-10-CM

## 2016-01-16 DIAGNOSIS — F4321 Adjustment disorder with depressed mood: Secondary | ICD-10-CM | POA: Diagnosis not present

## 2016-01-16 DIAGNOSIS — F4320 Adjustment disorder, unspecified: Secondary | ICD-10-CM | POA: Insufficient documentation

## 2016-01-16 DIAGNOSIS — H60503 Unspecified acute noninfective otitis externa, bilateral: Secondary | ICD-10-CM

## 2016-01-16 DIAGNOSIS — J029 Acute pharyngitis, unspecified: Secondary | ICD-10-CM | POA: Diagnosis not present

## 2016-01-16 DIAGNOSIS — J3089 Other allergic rhinitis: Secondary | ICD-10-CM | POA: Diagnosis not present

## 2016-01-16 DIAGNOSIS — J0100 Acute maxillary sinusitis, unspecified: Secondary | ICD-10-CM

## 2016-01-16 LAB — POCT RAPID STREP A (OFFICE): Rapid Strep A Screen: NEGATIVE

## 2016-01-16 MED ORDER — TRAZODONE HCL 50 MG PO TABS: 25.0000 mg | ORAL_TABLET | Freq: Every day | ORAL | 3 refills | Status: DC

## 2016-01-16 MED ORDER — PREDNISONE 5 MG (21) PO TBPK: 5.0000 mg | ORAL_TABLET | ORAL | 0 refills | Status: DC

## 2016-01-16 MED ORDER — AZITHROMYCIN 250 MG PO TABS: ORAL_TABLET | ORAL | 0 refills | Status: DC

## 2016-01-16 MED ORDER — AZITHROMYCIN 250 MG PO TABS
ORAL_TABLET | ORAL | 0 refills | Status: DC
Start: 2016-01-16 — End: 2016-01-16

## 2016-01-16 NOTE — Assessment & Plan Note (Addendum)
Improving however Mother passed 1 month ago, poor sleep and normal grief reaction, short term trazodone, and allowed to ventilate for approx 7 mins, she is also encouraged to follow through with therapy which she has indirectly on her job. Also has excellent support from [place] and family

## 2016-01-16 NOTE — Assessment & Plan Note (Signed)
3 day h/o increased sinus drainage, pessure and body aches, z pack

## 2016-01-16 NOTE — Patient Instructions (Signed)
Annual exam as before, call if you need me sooner  You are treated for sinusitis and uncontrolled allergies Prednisone and z pack prescribed  Steroid drops for use in the ears for itching  will be called in to your pharmacy  'g Trazodone sent to help with sleep , short term  My condolence  On your recent loss

## 2016-01-16 NOTE — Progress Notes (Signed)
   [name]     MRN: [id number]      DOB: Jun 13, 1950   HPI Ms. Barefoot is here with a 3 day h/o sore throat and sinus pressure and drainage, and low grade temp 99.9, had generalized body aches, increased sinus drainage, denies chest congestion or productive cough C/o  Grief with poor sleep, though improving still not where she is best functional, also has good support system    ROS . Denies chest pains, palpitations and leg swelling Denies abdominal pain, nausea, vomiting,diarrhea or constipation.   Denies dysuria, frequency, hesitancy or incontinence. Denies joint pain, swelling and limitation in mobility. Denies headaches, seizures, numbness, or tingling. . Denies skin break down or rash.   PE  BP 130/82   Pulse 91   Temp 98.9 F (37.2 C) (Oral)   Resp 16   Ht 5\' 8"  (1.727 m)   Wt 199 lb (90.3 kg)   SpO2 98%   BMI 30.26 kg/m   Patient alert and oriented and in no cardiopulmonary distress.  HEENT: No facial asymmetry, EOMI,   oropharynx erythematous and moist. No exudate. Neck supple no JVD, no mass.Bilateral maxillary sinus tenderness, External ear canals bilaterally erythematous Chest: Clear to auscultation bilaterally.  CVS: S1, S2 no murmurs, no S3.Regular rate.  ABD: Soft non tender.   Ext: No edema  MS: Adequate ROM spine, shoulders, hips and knees.  Skin: Intact, no ulcerations or rash noted.  Psych: Good eye contact, normal affect. Memory intact tearful and mildly  depressed appearing.  CNS: CN 2-12 intact, power,  normal throughout.no focal deficits noted.   Assessment & Plan  Grief reaction Improving however Mother passed 1 month ago, poor sleep and normal grief reaction, short term trazodone, and allowed to ventilate for approx 7 mins, she is also encouraged to follow through with therapy which she has indirectly on her job. Also has excellent support from Prot[employer] and family  Acute sinusitis 3 day h/o increased sinus drainage, pessure and body  aches, z pack  Allergic rhinitis Increased and uncontrolled, start prednisone dose pack  Acute maxillary sinusitis z pack prescribed  Otitis externa, acute noninfectious Reports excessive itching of outer ears, external canals arel erythematous bilaterally, topical prednisone opthalmic drops 3 to 4 times daily for 3 to 5 days, and commitment to oral medication for allergies  Insomnia due to stress Stress has lessened as Mother has pased peacefully, but unable to sleep , despite xanax, will prescribe trazodone short term  Essential hypertension Controlled, no change in medication DASH diet and commitment to daily physical activity for a minimum of 30 minutes discussed and encouraged, as a part of hypertension management. The importance of attaining a healthy weight is also discussed.  BP/Weight 01/16/2016 11/15/2015 04/12/2015 09/28/2014 08/11/2014 03/24/2014 [id number]  Systolic BP [id number]  Diastolic BP 82 80 82 80 82 80 82  Wt. (Lbs) 199 200.12 198 195 196 196 191  BMI 30.26 30.44 30.11 30.53 30.69 30.69 29.91       Sore throat Rapid strep is negative, pt reassured

## 2016-01-16 NOTE — Assessment & Plan Note (Signed)
Increased and uncontrolled, start prednisone dose pack

## 2016-01-19 DIAGNOSIS — H60509 Unspecified acute noninfective otitis externa, unspecified ear: Secondary | ICD-10-CM | POA: Insufficient documentation

## 2016-01-19 DIAGNOSIS — J029 Acute pharyngitis, unspecified: Secondary | ICD-10-CM | POA: Insufficient documentation

## 2016-01-19 NOTE — Assessment & Plan Note (Signed)
Rapid strep is negative, pt reassured

## 2016-01-19 NOTE — Assessment & Plan Note (Signed)
z pack prescribed 

## 2016-01-19 NOTE — Assessment & Plan Note (Signed)
Stress has lessened as Mother has pased peacefully, but unable to sleep , despite xanax, will prescribe trazodone short term

## 2016-01-19 NOTE — Assessment & Plan Note (Signed)
Reports excessive itching of outer ears, external canals arel erythematous bilaterally, topical prednisone opthalmic drops 3 to 4 times daily for 3 to 5 days, and commitment to oral medication for allergies

## 2016-01-19 NOTE — Assessment & Plan Note (Signed)
Controlled, no change in medication DASH diet and commitment to daily physical activity for a minimum of 30 minutes discussed and encouraged, as a part of hypertension management. The importance of attaining a healthy weight is also discussed.  BP/Weight 01/16/2016 11/15/2015 04/12/2015 09/28/2014 08/11/2014 03/24/2014 [id number]  Systolic BP [id number]  Diastolic BP 82 80 82 80 82 80 82  Wt. (Lbs) 199 200.12 198 195 196 196 191  BMI 30.26 30.44 30.11 30.53 30.69 30.69 29.91

## 2016-02-11 ENCOUNTER — Other Ambulatory Visit: Payer: Self-pay

## 2016-02-11 MED ORDER — TRAZODONE HCL 50 MG PO TABS: 25.0000 mg | ORAL_TABLET | Freq: Every day | ORAL | 0 refills | Status: DC

## 2016-03-27 ENCOUNTER — Other Ambulatory Visit: Payer: Self-pay | Admitting: Family Medicine

## 2016-03-27 DIAGNOSIS — E785 Hyperlipidemia, unspecified: Secondary | ICD-10-CM

## 2016-04-02 ENCOUNTER — Other Ambulatory Visit: Payer: Self-pay

## 2016-04-02 MED ORDER — VALSARTAN 80 MG PO TABS: 80.0000 mg | ORAL_TABLET | Freq: Every day | ORAL | 0 refills | Status: DC

## 2016-05-13 ENCOUNTER — Encounter: Payer: Self-pay | Admitting: Family Medicine

## 2016-05-13 ENCOUNTER — Ambulatory Visit (INDEPENDENT_AMBULATORY_CARE_PROVIDER_SITE_OTHER): Payer: Managed Care, Other (non HMO) | Admitting: Family Medicine

## 2016-05-13 VITALS — BP 140/90 | HR 97 | Resp 16 | Ht 68.0 in | Wt 199.4 lb

## 2016-05-13 DIAGNOSIS — Z1211 Encounter for screening for malignant neoplasm of colon: Secondary | ICD-10-CM

## 2016-05-13 DIAGNOSIS — Z0000 Encounter for general adult medical examination without abnormal findings: Secondary | ICD-10-CM | POA: Diagnosis not present

## 2016-05-13 DIAGNOSIS — F4320 Adjustment disorder, unspecified: Secondary | ICD-10-CM

## 2016-05-13 DIAGNOSIS — M05769 Rheumatoid arthritis with rheumatoid factor of unspecified knee without organ or systems involvement: Secondary | ICD-10-CM | POA: Diagnosis not present

## 2016-05-13 DIAGNOSIS — R7303 Prediabetes: Secondary | ICD-10-CM

## 2016-05-13 DIAGNOSIS — I10 Essential (primary) hypertension: Secondary | ICD-10-CM | POA: Diagnosis not present

## 2016-05-13 DIAGNOSIS — E559 Vitamin D deficiency, unspecified: Secondary | ICD-10-CM

## 2016-05-13 DIAGNOSIS — M1990 Unspecified osteoarthritis, unspecified site: Secondary | ICD-10-CM

## 2016-05-13 DIAGNOSIS — E785 Hyperlipidemia, unspecified: Secondary | ICD-10-CM | POA: Diagnosis not present

## 2016-05-13 DIAGNOSIS — M069 Rheumatoid arthritis, unspecified: Secondary | ICD-10-CM | POA: Insufficient documentation

## 2016-05-13 DIAGNOSIS — F4321 Adjustment disorder with depressed mood: Secondary | ICD-10-CM

## 2016-05-13 LAB — POC HEMOCCULT BLD/STL (OFFICE/1-CARD/DIAGNOSTIC): FECAL OCCULT BLD: NEGATIVE

## 2016-05-13 MED ORDER — PREDNISONE 5 MG (21) PO TBPK: 5.0000 mg | ORAL_TABLET | ORAL | 0 refills | Status: DC

## 2016-05-13 MED ORDER — METHYLPREDNISOLONE ACETATE 80 MG/ML IJ SUSP
80.0000 mg | Freq: Once | INTRAMUSCULAR | Status: AC
  Administered 2016-05-13: 80 mg via INTRAMUSCULAR

## 2016-05-13 MED ORDER — HYDROXYZINE HCL 50 MG PO TABS
ORAL_TABLET | ORAL | 0 refills | Status: DC
Start: 2016-05-13 — End: 2018-04-28

## 2016-05-13 NOTE — Patient Instructions (Addendum)
F/u in 4 month, call if you need me sooner  Depo medrol injection in office today for arthritis pain and 6 day course of prednisone is prescribed  PLEASE seriously consider therapy to help you with your grief process  New to help with sleep is hydroxyzine, and practice good sleep hygiene  You are referred to Dr Charlestine Night  Will hold on ear drops, oral prednisone will address the problem  Thank you  for choosing [facility]. We consider it a privelige to serve you.  Delivering excellent health care in a caring and  compassionate way is our goal.  Partnering with you,  so that together we can achieve this goal is our strategy.   Blood pressure slightly high today, stress, lack of sleep and pain are all contributing   Fasting labs are due please get asap

## 2016-05-13 NOTE — Assessment & Plan Note (Addendum)
Prolonged, recommend counselling, pt still unwilling to commit to this , though she clearly feels the need

## 2016-05-13 NOTE — Progress Notes (Signed)
    [name]     MRN: [id number]      DOB: 24-Jul-1950  HPI: Patient is in for annual physical exam. 2 month h/o generalized joint pains rated between 6 and 8, at times so severe she has needed to use a cane, impaired mobility C/o uncontrolled grief, still unsure of commiting to therapy which she clearly needs, poor sleep, excessive crying and low level of function    labs are needed Immunization is reviewed , and  updated if needed.   PE: Pleasant  female, alert and oriented x 3, in no cardio-pulmonary distress.flat affect, tearful Afebrile. HEENT No facial trauma or asymetry. Sinuses non tender.  Extra occullar muscles intact, left conjunctival injection External ears normal, tympanic membranes clear.external auditory canal erythematous bilaterally, left greater than right Oropharynx moist, no exudate. Neck: supple, no adenopathy,JVD or thyromegaly.No bruits.  Chest: Clear to ascultation bilaterally.No crackles or wheezes. Non tender to palpation  Breast: No asymetry,no masses or lumps. No tenderness. No nipple discharge or inversion. No axillary or supraclavicular adenopathy  Cardiovascular system; Heart sounds normal,  S1 and  S2 ,no S3.  No murmur, or thrill. Apical beat not displaced Peripheral pulses normal.  Abdomen: Soft, non tender, no organomegaly or masses. No bruits. Bowel sounds normal. No guarding, tenderness or rebound.  Rectal:  Normal sphincter tone. No rectal mass. Guaiac negative stool.  GU: Not examined, asymptomatic, and pap due in 2018.   Musculoskeletal exam: Full ROM of spine, hips , shoulders and knees. No deformity ,swelling or crepitus noted. No muscle wasting or atrophy.   Neurologic: Cranial nerves 2 to 12 intact. Power, tone ,sensation and reflexes normal throughout. No disturbance in gait. No tremor.  Skin: Intact, no ulceration, erythema , scaling or rash noted. Pigmentation normal throughout  Psych; Flat  mood and  affect.Tearful. Judgement and concentration normal   Assessment & Plan:  Annual physical exam Annual exam as documented. Counseling done  re healthy lifestyle involving commitment to 150 minutes exercise per week, heart healthy diet, and attaining healthy weight.The importance of adequate sleep also discussed.  Immunization and cancer screening needs are specifically addressed at this visit.   Grief reaction Prolonged, recommend counselling, pt still unwilling to commit to this , though she clearly feels the need  Rheumatoid arthritis ([place]) With 2 month flare of generalized joint pains. Depo medrol administered and short course of prednisone prescribed, also referred to rheumatology

## 2016-05-13 NOTE — Assessment & Plan Note (Addendum)
Annual exam as documented. Counseling done  re healthy lifestyle involving commitment to 150 minutes exercise per week, heart healthy diet, and attaining healthy weight.The importance of adequate sleep also discussed.  Immunization and cancer screening needs are specifically addressed at this visit.  

## 2016-05-13 NOTE — Assessment & Plan Note (Signed)
With 2 month flare of generalized joint pains. Depo medrol administered and short course of prednisone prescribed, also referred to rheumatology

## 2016-05-16 ENCOUNTER — Encounter: Payer: Self-pay | Admitting: Family Medicine

## 2016-05-16 ENCOUNTER — Other Ambulatory Visit: Payer: Self-pay | Admitting: Family Medicine

## 2016-05-16 MED ORDER — PREDNISONE 5 MG (21) PO TBPK: 5.0000 mg | ORAL_TABLET | ORAL | 0 refills | Status: DC

## 2016-05-24 LAB — CBC
HCT: 41.9 % (ref 35.0–45.0)
Hemoglobin: 13.6 g/dL (ref 11.7–15.5)
MCH: 27.6 pg (ref 27.0–33.0)
MCHC: 32.5 g/dL (ref 32.0–36.0)
MCV: 85 fL (ref 80.0–100.0)
MPV: 10.7 fL (ref 7.5–12.5)
PLATELETS: 342 10*3/uL (ref 140–400)
RBC: 4.93 MIL/uL (ref 3.80–5.10)
RDW: 14.7 % (ref 11.0–15.0)
WBC: 10.6 10*3/uL (ref 3.8–10.8)

## 2016-05-24 LAB — COMPLETE METABOLIC PANEL WITH GFR
ALBUMIN: 3.7 g/dL (ref 3.6–5.1)
ALK PHOS: 80 U/L (ref 33–130)
ALT: 16 U/L (ref 6–29)
AST: 17 U/L (ref 10–35)
BUN: 19 mg/dL (ref 7–25)
CALCIUM: 9 mg/dL (ref 8.6–10.4)
CO2: 24 mmol/L (ref 20–31)
CREATININE: 0.7 mg/dL (ref 0.50–0.99)
Chloride: 104 mmol/L (ref 98–110)
GFR, Est African American: >89 mL/min (ref >=60)
GFR, Est Non African American: >89 mL/min (ref >=60)
Glucose, Bld: 86 mg/dL (ref 65–99)
Potassium: 4.4 mmol/L (ref 3.5–5.3)
Sodium: 138 mmol/L (ref 135–146)
TOTAL PROTEIN: 7.2 g/dL (ref 6.1–8.1)
Total Bilirubin: 0.4 mg/dL (ref 0.2–1.2)

## 2016-05-24 LAB — LIPID PANEL
Cholesterol: 180 mg/dL (ref <200)
HDL: 48 mg/dL — ABNORMAL LOW (ref >50)
LDL CALC: 112 mg/dL — AB (ref <100)
Total CHOL/HDL Ratio: 3.8 Ratio (ref <5.0)
Triglycerides: 100 mg/dL (ref <150)
VLDL: 20 mg/dL (ref <30)

## 2016-05-24 LAB — TSH: TSH: 0.71 m[IU]/L

## 2016-05-26 LAB — HEMOGLOBIN A1C
HEMOGLOBIN A1C: 5.5 % (ref <5.7)
Mean Plasma Glucose: 111 mg/dL

## 2016-05-27 ENCOUNTER — Encounter: Payer: Self-pay | Admitting: Family Medicine

## 2016-05-27 LAB — VITAMIN D 25 HYDROXY (VIT D DEFICIENCY, FRACTURES): VIT D 25 HYDROXY: 9 ng/mL — AB (ref 30–100)

## 2016-05-27 MED ORDER — VITAMIN D (ERGOCALCIFEROL) 1.25 MG (50000 UNIT) PO CAPS: 50000.0000 [IU] | ORAL_CAPSULE | ORAL | 0 refills | Status: DC

## 2016-05-28 ENCOUNTER — Encounter: Payer: Self-pay | Admitting: Family Medicine

## 2016-06-08 ENCOUNTER — Encounter (HOSPITAL_COMMUNITY): Payer: Self-pay | Admitting: Emergency Medicine

## 2016-06-08 ENCOUNTER — Emergency Department (HOSPITAL_COMMUNITY): Payer: Managed Care, Other (non HMO)

## 2016-06-08 ENCOUNTER — Emergency Department (HOSPITAL_COMMUNITY)
Admission: EM | Admit: 2016-06-08 | Discharge: 2016-06-08 | Disposition: A | Discharge status: Home / Self Care | Payer: Managed Care, Other (non HMO) | Attending: Emergency Medicine | Admitting: Emergency Medicine

## 2016-06-08 DIAGNOSIS — R091 Pleurisy: Secondary | ICD-10-CM

## 2016-06-08 DIAGNOSIS — R079 Chest pain, unspecified: Secondary | ICD-10-CM

## 2016-06-08 DIAGNOSIS — Z79899 Other long term (current) drug therapy: Secondary | ICD-10-CM | POA: Insufficient documentation

## 2016-06-08 DIAGNOSIS — I10 Essential (primary) hypertension: Secondary | ICD-10-CM | POA: Diagnosis not present

## 2016-06-08 DIAGNOSIS — Z87891 Personal history of nicotine dependence: Secondary | ICD-10-CM | POA: Diagnosis not present

## 2016-06-08 DIAGNOSIS — J9811 Atelectasis: Secondary | ICD-10-CM | POA: Insufficient documentation

## 2016-06-08 LAB — BASIC METABOLIC PANEL
Anion gap: 8 (ref 5–15)
BUN: 14 mg/dL (ref 6–20)
CHLORIDE: 101 mmol/L (ref 101–111)
CO2: 27 mmol/L (ref 22–32)
Calcium: 9.4 mg/dL (ref 8.9–10.3)
Creatinine, Ser: 0.67 mg/dL (ref 0.44–1.00)
GFR calc Af Amer: >60 mL/min (ref >60)
GFR calc non Af Amer: >60 mL/min (ref >60)
GLUCOSE: 119 mg/dL — AB (ref 65–99)
POTASSIUM: 3.8 mmol/L (ref 3.5–5.1)
Sodium: 136 mmol/L (ref 135–145)

## 2016-06-08 LAB — CBC
HEMATOCRIT: 42.1 % (ref 36.0–46.0)
HEMOGLOBIN: 14.1 g/dL (ref 12.0–15.0)
MCH: 28.4 pg (ref 26.0–34.0)
MCHC: 33.5 g/dL (ref 30.0–36.0)
MCV: 84.7 fL (ref 78.0–100.0)
Platelets: 281 10*3/uL (ref 150–400)
RBC: 4.97 MIL/uL (ref 3.87–5.11)
RDW: 14.9 % (ref 11.5–15.5)
WBC: 15.8 10*3/uL — ABNORMAL HIGH (ref 4.0–10.5)

## 2016-06-08 LAB — I-STAT TROPONIN, ED: Troponin i, poc: 0 ng/mL (ref 0.00–0.08)

## 2016-06-08 LAB — D-DIMER, QUANTITATIVE: D-Dimer, Quant: 1.6 ug/mL-FEU — ABNORMAL HIGH (ref 0.00–0.50)

## 2016-06-08 MED ORDER — IOPAMIDOL (ISOVUE-370) INJECTION 76%
100.0000 mL | Freq: Once | INTRAVENOUS | Status: AC | PRN
  Administered 2016-06-08: 100 mL via INTRAVENOUS

## 2016-06-08 MED ORDER — FENTANYL CITRATE (PF) 100 MCG/2ML IJ SOLN
100.0000 ug | Freq: Once | INTRAMUSCULAR | Status: AC
  Administered 2016-06-08: 100 ug via INTRAVENOUS
  Filled 2016-06-08: qty 2

## 2016-06-08 MED ORDER — FENTANYL CITRATE (PF) 100 MCG/2ML IJ SOLN
100.0000 ug | Freq: Once | INTRAMUSCULAR | Status: AC
  Administered 2016-06-08: 100 ug via INTRAVENOUS
  Filled 2016-06-08 (×2): qty 2

## 2016-06-08 MED ORDER — TRAMADOL HCL 50 MG PO TABS: 50.0000 mg | ORAL_TABLET | Freq: Four times a day (QID) | ORAL | 0 refills | Status: DC | PRN

## 2016-06-08 MED ORDER — DOXYCYCLINE HYCLATE 100 MG PO CAPS: 100.0000 mg | ORAL_CAPSULE | Freq: Two times a day (BID) | ORAL | 0 refills | Status: DC

## 2016-06-08 MED ORDER — PREDNISONE 20 MG PO TABS: 40.0000 mg | ORAL_TABLET | Freq: Every day | ORAL | 0 refills | Status: DC

## 2016-06-08 NOTE — ED Triage Notes (Signed)
Patient c/o central chest pain that started this morning at 3 and has progressively gotten worse. Patient reports shortness of breath. Denies any cardiac hx. Patient states took tylenol earlier today thinking she had strained a muscle but had no improvement. Denies any coughing. Per patient pain worse with deep breath.

## 2016-06-08 NOTE — ED Provider Notes (Addendum)
The patient has continued to have chest pain though it does seem to be significantly improved after receiving intravenous fentanyl, I have personally evaluated the patient and she does have clear lungs, no coughing, no shortness of breath and minimal chest pain at this time. She has no fevers, she does have a leukocytosis but she is on prednisone recently. The CT scan does reveal that she has some atelectasis, there is no obvious infiltrates, she is not coughing or having fevers. I've had a long discussion with the patient regarding all of her results including the CT scan findings. She feels comfortable not taking an antibiotic unless her symptoms worsen or become more infectious. She will go on an increased dose of prednisone and taper off of 40 mg daily. She will also get a short course of tramadol for the pain as she cannot take anti-inflammatories. The patient was in total agreement with the plan. She will return should her symptoms worsen.  No m/r/g and no pericarditis on the ECG   Noemi Chapel, MD 06/08/16 1905    Noemi Chapel, MD 06/08/16 [phone]

## 2016-06-08 NOTE — Discharge Instructions (Signed)
Please start taking the doxycycline if you should develop increased coughing, shortness of breath, chest pain or fevers.  Your CT scan shows that there is  no findings of blood clot, no findings of collapsed lungs, no findings of obvious pneumonia though there is some atelectasis which is small areas of lung that has not been expanded appropriately. If you should develop increasing symptoms take the antibiotic.  Please take prednisone 40 mg a day for the next 5 days, call your doctor in the morning to have them help taper this down after that time.  Please return to the emergency department immediately for severe or worsening symptoms including chest pain fevers or difficulty breathing.

## 2016-06-08 NOTE — ED Provider Notes (Signed)
[facility] DEPT Provider Note   CSN: [id number] Arrival date & time: 06/08/16  1350     History   Chief Complaint Chief Complaint  Patient presents with  . Chest Pain    HPI LIV FOOS is a 66 y.o. female.  HPI Patient presents with mid chest pain. Began around 3 in the morning last night. Worse with movements and breathing. Improves somewhat with leaning forward. Some mild shortness of breath with it. Patient thinks she could've pulled a muscle because she was moving something heavy yesterday. No fevers or chills. No cough. no abdominal pain. No relief with medicine it home. History of rheumatoid arthritis. Past Medical History:  Diagnosis Date  . Arthritis, rheumatoid (HCC)    hx  . Hyperlipidemia   . Hypertension   . Obesity   . 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 Vitamin D deficiency 05/13/2016  . Rheumatoid arthritis ([place]) 05/13/2016  . Otitis externa, acute noninfectious 01/19/2016  . Grief reaction 01/16/2016  . Insomnia due to stress 11/16/2015  . Annual physical exam 04/12/2015  . GERD (gastroesophageal reflux disease) 03/24/2014  . Obesity (BMI 30-39.9) 12/28/2011  . Metabolic syndrome X 0000000  . Prediabetes 09/28/2011  . Allergic rhinitis 09/04/2009  . Hyperlipidemia LDL goal <100 08/12/2007  . Essential hypertension 08/12/2007    Past Surgical History:  Procedure Laterality Date  . APPENDECTOMY  1983  . LITHOTRIPSY  2000   rt renal stone     OB History    No data available       Home Medications    Prior to Admission medications   Medication Sig Start Date End Date Taking? Authorizing Provider  albuterol (PROVENTIL HFA;VENTOLIN HFA) 108 (90 BASE) MCG/ACT inhaler Inhale 2 puffs into the lungs every 6 (six) hours as needed for wheezing. 02/24/13  Yes Fayrene Helper, MD  ALPRAZolam Duanne Moron) 0.25 MG tablet One tablet at bedtime, as needed, for anxiety and poor sleep 11/15/15  Yes  Fayrene Helper, MD  aspirin (ASPIRIN LOW DOSE) 81 MG EC tablet Take 81 mg by mouth daily.     Yes Historical Provider, MD  Calcium Carbonate-Vitamin D (CALTRATE 600+D) 600-400 MG-UNIT per tablet Take 1 tablet by mouth 2 (two) times daily.     Yes Historical Provider, MD  cyclopentolate (CYCLODRYL,CYCLOGYL) 1 % ophthalmic solution Place 1 drop into the right eye as needed.   Yes Historical Provider, MD  fluticasone (FLONASE) 50 MCG/ACT nasal spray Place 2 sprays into both nostrils daily. Patient taking differently: Place 2 sprays into both nostrils daily as needed for allergies.  08/11/14  Yes Fayrene Helper, MD  hydrOXYzine (ATARAX/VISTARIL) 50 MG tablet One tablet at bedtime , as needed, for insomnia 05/13/16  Yes Fayrene Helper, MD  ketorolac (ACULAR) 0.5 % ophthalmic solution Place 1 drop into both eyes 4 (four) times daily as needed.   Yes Historical Provider, MD  montelukast (SINGULAIR) 10 MG tablet Take 1 tablet (10 mg total) by mouth at bedtime. 09/28/14  Yes Fayrene Helper, MD  Multiple Vitamins-Minerals (CENTRUM SILVER PO) Take by mouth daily.     Yes Historical Provider, MD  Omega-3 Fatty Acids (FISH OIL) 1000 MG CAPS Take 1,000 capsules by mouth daily.    Yes Historical Provider, MD  omeprazole (PRILOSEC) 20 MG capsule Take 20 mg by mouth daily.   Yes Historical Provider, MD  pravastatin (PRAVACHOL) 10 MG tablet TAKE 1  TABLET DAILY STOP   TAKING CRESTOR 03/27/16  Yes Fayrene Helper, MD  prednisoLONE acetate (PRED FORTE) 1 % ophthalmic suspension Place 1 drop into the left eye daily.   Yes Historical Provider, MD  predniSONE (DELTASONE) 5 MG tablet Take 5-10 mg by mouth daily with breakfast. Take 10 mg once daily until 06/26/16, 5 mg daily until 07/10/16, then 5 mg every other day.   Yes Historical Provider, MD  valsartan (DIOVAN) 80 MG tablet Take 1 tablet (80 mg total) by mouth at bedtime. 04/02/16  Yes Fayrene Helper, MD  predniSONE (STERAPRED UNI-PAK 21 TAB) 5 MG (21)  TBPK tablet Take 1 tablet (5 mg total) by mouth as directed. Use as directed Patient not taking: Reported on 06/08/2016 05/13/16   Fayrene Helper, MD  Vitamin D, Ergocalciferol, (DRISDOL) 50000 units CAPS capsule Take 1 capsule (50,000 Units total) by mouth every 7 (seven) days. Patient not taking: Reported on 06/08/2016 05/27/16   Raylene Everts, MD    Family History Family History  Problem Relation Age of Onset  . Arthritis Mother   . Hypertension Mother   . Colitis Sister     allergies   . Migraines Sister   . Hypertension Sister   . Allergies Brother   . Asthma Brother     Social History Social History  Substance Use Topics  . Smoking status: Former Smoker    Types: Cigarettes  . Smokeless tobacco: Never Used  . Alcohol use No     Allergies   Septra [sulfamethoxazole-trimethoprim]; Barbiturates; Cefuroxime axetil; Chlordiazepoxide; Hydrocodone-acetaminophen; Phenytoin; and Rofecoxib   Review of Systems Review of Systems  Constitutional: Negative for appetite change.  HENT: Negative for congestion.   Respiratory: Positive for shortness of breath.   Cardiovascular: Positive for chest pain. Negative for leg swelling.  Gastrointestinal: Negative for abdominal pain.  Genitourinary: Negative for dysuria.  Musculoskeletal: Positive for joint swelling.  Skin: Negative for wound.  Neurological: Negative for numbness.  Hematological: Negative for adenopathy.  Psychiatric/Behavioral: Negative for confusion.     Physical Exam Updated Vital Signs BP 106/70   Pulse 83   Temp 98.8 F (37.1 C) (Oral)   Resp 19   Ht 5' 6.5" (1.689 m)   Wt 191 lb (86.6 kg)   SpO2 95%   BMI 30.37 kg/m   Physical Exam  Constitutional: She appears well-developed.  Cardiovascular: Normal rate.  Exam reveals no gallop and no friction rub.   No murmur heard. Pulmonary/Chest: Effort normal. She exhibits tenderness.  Moderate tenderness to the anterior medial and left parasternal chest  wall. No rash.  Abdominal: There is no tenderness.     ED Treatments / Results  Labs (all labs ordered are listed, but only abnormal results are displayed) Labs Reviewed  BASIC METABOLIC PANEL - Abnormal; Notable for the following:       Result Value   Glucose, Bld 119 (*)    All other components within normal limits  CBC - Abnormal; Notable for the following:    WBC 15.8 (*)    All other components within normal limits  D-DIMER, QUANTITATIVE (NOT AT [facility]) - Abnormal; Notable for the following:    D-Dimer, Quant 1.60 (*)    All other components within normal limits  I-STAT TROPOININ, ED    EKG  EKG Interpretation  Date/Time:  Sunday June 08 2016 14:04:00 EST Ventricular Rate:  86 PR Interval:    QRS Duration: 75 QT Interval:  366 QTC Calculation: 438 R Axis:  62 Text Interpretation:  Sinus rhythm RSR' in V1 or V2, probably normal variant Minimal ST elevation, anterior leads Confirmed by Alvino Chapel  MD, Ovid Curd [phone]) on 06/08/2016 2:07:04 PM       Radiology No results found.  Procedures Procedures (including critical care time)  Medications Ordered in ED Medications  fentaNYL (SUBLIMAZE) injection 100 mcg (100 mcg Intravenous Given 06/08/16 1452)     Initial Impression / Assessment and Plan / ED Course  I have reviewed the triage vital signs and the nursing notes.  Pertinent labs & imaging results that were available during my care of the patient were reviewed by me and considered in my medical decision making (see chart for details).  Clinical Course     Patient with chest pain. EKG reassuring. Labs reassuring but d-dimer is elevated. Will get CTA. Has history of rheumatoid arthritis. Already on steroids. Care turned over to Dr. Sabra Heck. Doubt this is a ischemic cause.  Final Clinical Impressions(s) / ED Diagnoses   Final diagnoses:  Nonspecific chest pain    New Prescriptions New Prescriptions   No medications on file     Davonna Belling,  MD 06/08/16 1523

## 2016-06-10 ENCOUNTER — Ambulatory Visit (INDEPENDENT_AMBULATORY_CARE_PROVIDER_SITE_OTHER): Payer: Managed Care, Other (non HMO) | Admitting: Family Medicine

## 2016-06-10 ENCOUNTER — Encounter: Payer: Self-pay | Admitting: Family Medicine

## 2016-06-10 VITALS — BP 122/80 | HR 112 | Temp 98.8°F | Resp 16 | Ht 68.0 in | Wt 193.0 lb

## 2016-06-10 DIAGNOSIS — Z09 Encounter for follow-up examination after completed treatment for conditions other than malignant neoplasm: Secondary | ICD-10-CM | POA: Diagnosis not present

## 2016-06-10 DIAGNOSIS — R071 Chest pain on breathing: Secondary | ICD-10-CM

## 2016-06-10 DIAGNOSIS — I10 Essential (primary) hypertension: Secondary | ICD-10-CM

## 2016-06-10 DIAGNOSIS — R9389 Abnormal findings on diagnostic imaging of other specified body structures: Secondary | ICD-10-CM | POA: Insufficient documentation

## 2016-06-10 DIAGNOSIS — R938 Abnormal findings on diagnostic imaging of other specified body structures: Secondary | ICD-10-CM | POA: Diagnosis not present

## 2016-06-10 NOTE — Patient Instructions (Addendum)
F/u as before, call if you need me before  Please take entire course of antibiotic. An d prednisone as  prescribed  I am concerned about  Abnormal chest scan and have referred you to Dr Luan Pulling for follow up  I will also send scan report to Dr Charlestine Night as this MAY be a manifestation of your autoimmune process  Work excuse from 1/15 to return 06/16/2016  Fluids , rest , medication, do NOT worry  Thank you  for choosing [facility]. We consider it a privelige to serve you.  Delivering excellent health care in a caring and  compassionate way is our goal.  Partnering with you,  so that together we can achieve this goal is our strategy.

## 2016-06-10 NOTE — Progress Notes (Signed)
   [name]     MRN: [id number]      DOB: 1950-12-30   HPI [name] is here for follow up of recent eD visit for acute pleuritic chest pain. Treated with doxycycline, prednisone and tramadol, no fever or chills, scant sputum , some is yellow  Chest scan is abnormal, needs pulmonary eval Has been to rheumatology since last visit, and is on tapering doses of prednisone ED records are reviewed at visit with the patient and questions and concerns are  answered ROS See HPI   C/o fatigue, poor exercise tolerance and malaise, still having chest pain intermittently which she uses tramadol for Denies sinus pressure, nasal congestion, ear pain or sore throat.  Denies  palpitations and leg swelling Denies abdominal pain, nausea, vomiting,diarrhea or constipation.   Denies dysuria, frequency, hesitancy or incontinence. Improved joint pain, swelling and limitation in mobility. Denies headaches, seizures, numbness, or tingling. Denies depression, anxiety or insomnia. Denies skin break down or rash.   PE  BP 122/80   Pulse (!) 112   Temp 98.8 F (37.1 C) (Oral)   Resp 16   Ht 5\' 8"  (1.727 m)   Wt 193 lb (87.5 kg)   SpO2 97%   BMI 29.35 kg/m   Patient alert and oriented and in no cardiopulmonary distress.  HEENT: No facial asymmetry, EOMI,   oropharynx pink and moist.  Neck supple no JVD, no mass.  Chest: Clear to auscultation bilaterally.  CVS: S1, S2 no murmurs, no S3.Regular rate.  ABD: Soft non tender.   Ext: No edema  MS: Adequate ROM spine, shoulders, hips and knees.  Skin: Intact, no ulcerations or rash noted.  Psych: Good eye contact, normal affect. Memory intact not anxious or depressed appearing.  CNS: CN 2-12 intact, power,  normal throughout.no focal deficits noted.   Assessment & Plan  Encounter for examination following treatment at hospital Evaluated in ED on 06/09/2015 for acute chest pain pleritic in nature. Still c/o fatigue and poor exercise  tolerance. cT scan abnormal and pt has autoimmune disease, reports adenopathy and infiltrate vs atelectasis. She is to complete antibiotic course. Work excuse to return next week, and referred to pulmonary Doc for f/u  Abnormal CT scan, chest Abnormal chest scan in pt with autoimmune disease, present with acute pleuritic chest pain will refer to Dr Luan Pulling, Copy of chest scan report to be sent to her rheumatologist also  Essential hypertension Controlled, no change in medication DASH diet and commitment to daily physical activity for a minimum of 30 minutes discussed and encouraged, as a part of hypertension management. The importance of attaining a healthy weight is also discussed.  BP/Weight 06/10/2016 06/08/2016 05/13/2016 01/16/2016 11/15/2015 [id number]  Systolic BP 123XX123 [id number] [id number] [id number]  Diastolic BP 80 69 90 82 80 82 80  Wt. (Lbs) 193 191 199.4 199 200.12 198 195  BMI 29.35 30.37 30.32 30.26 30.44 30.11 30.53

## 2016-06-11 ENCOUNTER — Ambulatory Visit: Payer: Managed Care, Other (non HMO) | Admitting: Family Medicine

## 2016-06-12 ENCOUNTER — Encounter: Payer: Self-pay | Admitting: Family Medicine

## 2016-06-12 NOTE — Assessment & Plan Note (Signed)
Evaluated in ED on 06/09/2015 for acute chest pain pleritic in nature. Still c/o fatigue and poor exercise tolerance. cT scan abnormal and pt has autoimmune disease, reports adenopathy and infiltrate vs atelectasis. She is to complete antibiotic course. Work excuse to return next week, and referred to pulmonary Doc for f/u

## 2016-06-12 NOTE — Assessment & Plan Note (Signed)
Controlled, no change in medication DASH diet and commitment to daily physical activity for a minimum of 30 minutes discussed and encouraged, as a part of hypertension management. The importance of attaining a healthy weight is also discussed.  BP/Weight 06/10/2016 06/08/2016 05/13/2016 01/16/2016 11/15/2015 [id number]  Systolic BP 123XX123 123456 [id number] 123456 [id number]  Diastolic BP 80 69 90 82 80 82 80  Wt. (Lbs) 193 191 199.4 199 200.12 198 195  BMI 29.35 30.37 30.32 30.26 30.44 30.11 30.53

## 2016-06-12 NOTE — Assessment & Plan Note (Addendum)
Abnormal chest scan in pt with autoimmune disease, present with acute pleuritic chest pain will refer to Dr Luan Pulling, Copy of chest scan report to be sent to her rheumatologist also

## 2016-07-10 ENCOUNTER — Encounter: Payer: Self-pay | Admitting: Family Medicine

## 2016-07-10 ENCOUNTER — Ambulatory Visit (INDEPENDENT_AMBULATORY_CARE_PROVIDER_SITE_OTHER): Payer: Managed Care, Other (non HMO) | Admitting: Family Medicine

## 2016-07-10 VITALS — BP 122/80 | HR 95 | Temp 99.7°F | Resp 18 | Ht 68.0 in | Wt 197.1 lb

## 2016-07-10 DIAGNOSIS — J111 Influenza due to unidentified influenza virus with other respiratory manifestations: Secondary | ICD-10-CM

## 2016-07-10 DIAGNOSIS — J3089 Other allergic rhinitis: Secondary | ICD-10-CM

## 2016-07-10 MED ORDER — MONTELUKAST SODIUM 10 MG PO TABS: 10.0000 mg | ORAL_TABLET | Freq: Every day | ORAL | 1 refills | Status: DC

## 2016-07-10 MED ORDER — OSELTAMIVIR PHOSPHATE 75 MG PO CAPS: 75.0000 mg | ORAL_CAPSULE | Freq: Two times a day (BID) | ORAL | 0 refills | Status: DC

## 2016-07-10 NOTE — Patient Instructions (Signed)
Fill and take the tamiflu today  Off work until Monday  Call for problems

## 2016-07-10 NOTE — Progress Notes (Signed)
Chief Complaint  Patient presents with  . Fever    24 hours  . URI    x 48 hours   Had cold symptoms 2 days ago, rapidly evolved to fatigue, sweats, chills, fever suspicious for influenza. 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 No NVD Drinking water Slept most of the day  Patient Active Problem List   Diagnosis Date Noted  . Abnormal CT scan, chest 06/10/2016  . Vitamin D deficiency 05/13/2016  . Rheumatoid arthritis ([place]) 05/13/2016  . Otitis externa, acute noninfectious 01/19/2016  . Grief reaction 01/16/2016  . Insomnia due to stress 11/16/2015  . Encounter for examination following treatment at hospital 04/12/2015  . GERD (gastroesophageal reflux disease) 03/24/2014  . Obesity (BMI 30-39.9) 12/28/2011  . Metabolic syndrome X 0000000  . Prediabetes 09/28/2011  . Allergic rhinitis 09/04/2009  . Hyperlipidemia LDL goal <100 08/12/2007  . Essential hypertension 08/12/2007    Outpatient Encounter Prescriptions as of 07/10/2016  Medication Sig  . albuterol (PROVENTIL HFA;VENTOLIN HFA) 108 (90 BASE) MCG/ACT inhaler Inhale 2 puffs into the lungs every 6 (six) hours as needed for wheezing.  Marland Kitchen ALPRAZolam (XANAX) 0.25 MG tablet One tablet at bedtime, as needed, for anxiety and poor sleep  . aspirin (ASPIRIN LOW DOSE) 81 MG EC tablet Take 81 mg by mouth daily.    . Calcium Carbonate-Vitamin D (CALTRATE 600+D) 600-400 MG-UNIT per tablet Take 1 tablet by mouth 2 (two) times daily.    . cyclopentolate (CYCLODRYL,CYCLOGYL) 1 % ophthalmic solution Place 1 drop into the right eye as needed.  . fluticasone (FLONASE) 50 MCG/ACT nasal spray Place 2 sprays into both nostrils daily. (Patient taking differently: Place 2 sprays into both  nostrils daily as needed for allergies. )  . hydrOXYzine (ATARAX/VISTARIL) 50 MG tablet One tablet at bedtime , as needed, for insomnia  . ketorolac (ACULAR) 0.5 % ophthalmic solution Place 1 drop into both eyes 4 (four) times daily as needed.  . montelukast (SINGULAIR) 10 MG tablet Take 1 tablet (10 mg total) by mouth at bedtime.  . Multiple Vitamins-Minerals (CENTRUM SILVER PO) Take by mouth daily.    . Omega-3 Fatty Acids (FISH OIL) 1000 MG CAPS Take 1,000 capsules by mouth daily.   Marland Kitchen omeprazole (PRILOSEC) 20 MG capsule Take 20 mg by mouth daily.  . pravastatin (PRAVACHOL) 10 MG tablet TAKE 1 TABLET DAILY STOP   TAKING CRESTOR  . prednisoLONE acetate (PRED FORTE) 1 % ophthalmic suspension Place 1 drop into the left eye daily.  . predniSONE (DELTASONE) 20 MG tablet Take 2 tablets (40 mg total) by mouth daily. (Patient taking differently: Take 5 mg by mouth daily. )  . traMADol (ULTRAM) 50 MG tablet Take 1 tablet (50 mg total) by mouth every 6 (six) hours as needed.  . valsartan (DIOVAN) 80 MG tablet Take 1 tablet (80 mg total) by mouth at bedtime.  . Vitamin D, Ergocalciferol, (DRISDOL) 50000 units CAPS capsule Take 1 capsule (50,000 Units total) by mouth every 7 (seven) days.  . [DISCONTINUED] montelukast (SINGULAIR) 10 MG tablet Take 1 tablet (10 mg total) by mouth at bedtime.  Marland Kitchen oseltamivir (TAMIFLU) 75 MG capsule Take 1 capsule (75 mg total) by mouth 2 (two) times daily.  . [  DISCONTINUED] doxycycline (VIBRAMYCIN) 100 MG capsule Take 1 capsule (100 mg total) by mouth 2 (two) times daily. (Patient not taking: Reported on 07/10/2016)   No facility-administered encounter medications on file as of 07/10/2016.     Allergies  Allergen Reactions  . Septra [Sulfamethoxazole-Trimethoprim] Dermatitis  . Barbiturates   . Cefuroxime Axetil   . Chlordiazepoxide   . Hydrocodone-Acetaminophen   . Phenytoin   . Rofecoxib     Review of Systems  Constitutional: Positive for chills, diaphoresis,  fatigue and fever.  HENT: Positive for congestion, postnasal drip, rhinorrhea and sinus pressure.   Eyes: Negative for redness and visual disturbance.  Respiratory: Positive for cough. Negative for shortness of breath.   Cardiovascular: Negative for chest pain, palpitations and leg swelling.  Gastrointestinal: Negative for diarrhea, nausea and vomiting.  Genitourinary: Negative for difficulty urinating and dysuria.  Musculoskeletal: Positive for arthralgias and myalgias.  Neurological: Positive for headaches. Negative for dizziness.    BP 122/80 (BP Location: Left Arm, Patient Position: Sitting, Cuff Size: Normal)   Pulse 95   Temp 99.7 F (37.6 C) (Oral)   Resp 18   Ht 5\' 8"  (1.727 m)   Wt 197 lb 1.3 oz (89.4 kg)   SpO2 99%   BMI 29.97 kg/m   Physical Exam  Constitutional: She is oriented to person, place, and time. She appears well-developed and well-nourished.  Looks ill.  Dripping sweat.  Fatigued.  HENT:  Head: Normocephalic and atraumatic.  Right Ear: External ear normal.  Left Ear: External ear normal.  Nose: Nose normal.  Mouth/Throat: Oropharynx is clear and moist.  Nasal membranes swollen and pink, posterior pharynx mildly injected  Eyes: Conjunctivae are normal. Pupils are equal, round, and reactive to light.  Neck: Normal range of motion.  Cardiovascular: Normal rate, regular rhythm and normal heart sounds.   Pulmonary/Chest: Effort normal and breath sounds normal. No respiratory distress.  Abdominal: Soft. Bowel sounds are normal.  No HSM  Lymphadenopathy:    She has no cervical adenopathy.  Neurological: She is alert and oriented to person, place, and time.  Skin: She is diaphoretic.  Psychiatric: She has a normal mood and affect. Her behavior is normal. Thought content normal.    ASSESSMENT/PLAN: 1.  Influenza  - Tamiflu   2. Other allergic rhinitis Needs refill - montelukast (SINGULAIR) 10 MG tablet; Take 1 tablet (10 mg total) by mouth at bedtime.   Dispense: 90 tablet; Refill: 1   Patient Instructions  Fill and take the tamiflu today  Off work until Monday  Call for problems   Raylene Everts, MD

## 2016-07-23 ENCOUNTER — Other Ambulatory Visit (HOSPITAL_COMMUNITY): Payer: Self-pay | Admitting: Pulmonary Disease

## 2016-07-23 DIAGNOSIS — R9389 Abnormal findings on diagnostic imaging of other specified body structures: Secondary | ICD-10-CM

## 2016-07-29 ENCOUNTER — Other Ambulatory Visit: Payer: Self-pay | Admitting: Family Medicine

## 2016-07-30 MED ORDER — VALSARTAN 80 MG PO TABS: 80.0000 mg | ORAL_TABLET | Freq: Every day | ORAL | 1 refills | Status: DC

## 2016-07-31 ENCOUNTER — Encounter (HOSPITAL_COMMUNITY): Payer: Self-pay

## 2016-07-31 ENCOUNTER — Ambulatory Visit (HOSPITAL_COMMUNITY)
Admission: RE | Admit: 2016-07-31 | Discharge: 2016-07-31 | Disposition: A | Discharge status: Home / Self Care | Payer: 59 | Source: Ambulatory Visit | Attending: Pulmonary Disease | Admitting: Pulmonary Disease

## 2016-07-31 DIAGNOSIS — R911 Solitary pulmonary nodule: Secondary | ICD-10-CM | POA: Diagnosis not present

## 2016-07-31 DIAGNOSIS — R590 Localized enlarged lymph nodes: Secondary | ICD-10-CM | POA: Diagnosis not present

## 2016-07-31 DIAGNOSIS — J432 Centrilobular emphysema: Secondary | ICD-10-CM | POA: Diagnosis not present

## 2016-07-31 DIAGNOSIS — I2510 Atherosclerotic heart disease of native coronary artery without angina pectoris: Secondary | ICD-10-CM | POA: Diagnosis not present

## 2016-07-31 DIAGNOSIS — R938 Abnormal findings on diagnostic imaging of other specified body structures: Secondary | ICD-10-CM | POA: Diagnosis present

## 2016-07-31 DIAGNOSIS — R9389 Abnormal findings on diagnostic imaging of other specified body structures: Secondary | ICD-10-CM

## 2016-07-31 HISTORY — DX: Systemic involvement of connective tissue, unspecified: M35.9

## 2016-07-31 LAB — POCT I-STAT, CHEM 8
BUN: 24 mg/dL — AB (ref 6–20)
CREATININE: 1.3 mg/dL — AB (ref 0.44–1.00)
Calcium, Ion: 1.16 mmol/L (ref 1.15–1.40)
Chloride: 103 mmol/L (ref 101–111)
GLUCOSE: 85 mg/dL (ref 65–99)
HEMATOCRIT: 40 % (ref 36.0–46.0)
Hemoglobin: 13.6 g/dL (ref 12.0–15.0)
POTASSIUM: 4.5 mmol/L (ref 3.5–5.1)
Sodium: 139 mmol/L (ref 135–145)
TCO2: 31 mmol/L (ref 0–100)

## 2016-07-31 MED ORDER — IOPAMIDOL (ISOVUE-300) INJECTION 61%
75.0000 mL | Freq: Once | INTRAVENOUS | Status: AC | PRN
  Administered 2016-07-31: 75 mL via INTRAVENOUS

## 2016-09-15 ENCOUNTER — Encounter: Payer: Self-pay | Admitting: Family Medicine

## 2016-09-15 ENCOUNTER — Ambulatory Visit: Payer: Managed Care, Other (non HMO)

## 2016-09-15 ENCOUNTER — Ambulatory Visit (INDEPENDENT_AMBULATORY_CARE_PROVIDER_SITE_OTHER): Payer: Managed Care, Other (non HMO) | Admitting: Family Medicine

## 2016-09-15 VITALS — BP 120/84 | HR 97 | Resp 16 | Ht 68.0 in | Wt 200.0 lb

## 2016-09-15 DIAGNOSIS — R9389 Abnormal findings on diagnostic imaging of other specified body structures: Secondary | ICD-10-CM

## 2016-09-15 DIAGNOSIS — E559 Vitamin D deficiency, unspecified: Secondary | ICD-10-CM

## 2016-09-15 DIAGNOSIS — I10 Essential (primary) hypertension: Secondary | ICD-10-CM

## 2016-09-15 DIAGNOSIS — E669 Obesity, unspecified: Secondary | ICD-10-CM

## 2016-09-15 DIAGNOSIS — R7303 Prediabetes: Secondary | ICD-10-CM

## 2016-09-15 DIAGNOSIS — R938 Abnormal findings on diagnostic imaging of other specified body structures: Secondary | ICD-10-CM | POA: Diagnosis not present

## 2016-09-15 DIAGNOSIS — F5102 Adjustment insomnia: Secondary | ICD-10-CM

## 2016-09-15 MED ORDER — ALPRAZOLAM 0.25 MG PO TABS: ORAL_TABLET | ORAL | 0 refills | Status: DC

## 2016-09-15 NOTE — Assessment & Plan Note (Signed)
Controlled, no change in medication DASH diet and commitment to daily physical activity for a minimum of 30 minutes discussed and encouraged, as a part of hypertension management. The importance of attaining a healthy weight is also discussed.  BP/Weight 09/15/2016 07/10/2016 06/10/2016 06/08/2016 05/13/2016 01/16/2016 6/72/0919  Systolic BP 802 217 981 025 486 282 417  Diastolic BP 84 80 80 69 90 82 80  Wt. (Lbs) 200 197.08 193 191 199.4 199 200.12  BMI 30.41 29.97 29.35 30.37 30.32 30.26 30.44

## 2016-09-15 NOTE — Assessment & Plan Note (Signed)
Deteriorated. Patient re-educated about  the importance of commitment to a  minimum of 150 minutes of exercise per week.  The importance of healthy food choices with portion control discussed. Encouraged to start a food diary, count calories and to consider  joining a support group. Sample diet sheets offered. Goals set by the patient for the next several months.   Weight /BMI 09/15/2016 07/10/2016 06/10/2016  WEIGHT 200 lb 197 lb 1.3 oz 193 lb  HEIGHT 5\' 8"  5\' 8"  5\' 8"   BMI 30.41 kg/m2 29.97 kg/m2 29.35 kg/m2

## 2016-09-15 NOTE — Patient Instructions (Addendum)
Annual physical exam first week in Novemebr, call if you need me before  Fasting lipid, cmp and eGFr, hBA1C and vit D 1 week before appointment, pls call in for lab order in September  Please schedule  mammogram June 22 or after  Plan for colonoscopy in late Summer, past due  Keep up with eye exam, and rheumatology ap[pt  Thankful chest scan almost totally normal, need to rept In 1 year  Start gardening, and check in with therapist  Thank you  for choosing [facility]. We consider it a privelige to serve you.  Delivering excellent health care in a caring and  compassionate way is our goal.  Partnering with you,  so that together we can achieve this goal is our strategy.     

## 2016-09-15 NOTE — Assessment & Plan Note (Signed)
Repeat scan in 1 year

## 2016-09-15 NOTE — Assessment & Plan Note (Signed)
Patient educated about the importance of limiting  Carbohydrate intake , the need to commit to daily physical activity for a minimum of 30 minutes , and to commit weight loss. The fact that changes in all these areas will reduce or eliminate all together the development of diabetes is stressed.   Diabetic Labs Latest Ref Rng & Units 07/31/2016 06/08/2016 05/24/2016 11/15/2015 04/11/2015  HbA1c <5.7 % - - 5.5 6.0(H) 6.0(H)  Chol <200 mg/dL - - 180 197 203(H)  HDL >50 mg/dL - - 48(L) 52 42(L)  Calc LDL <100 mg/dL - - 112(H) 129 138(H)  Triglycerides <150 mg/dL - - 100 78 115  Creatinine 0.44 - 1.00 mg/dL 1.30(H) 0.67 0.70 0.83 0.86   BP/Weight 09/15/2016 07/10/2016 06/10/2016 06/08/2016 05/13/2016 01/16/2016 5/49/8264  Systolic BP 158 309 407 680 881 103 159  Diastolic BP 84 80 80 69 90 82 80  Wt. (Lbs) 200 197.08 193 191 199.4 199 200.12  BMI 30.41 29.97 29.35 30.37 30.32 30.26 30.44   No flowsheet data found.  Updated lab needed at/ before next visit.

## 2016-09-15 NOTE — Progress Notes (Signed)
[name]     MRN: [id number]      DOB: 28-Mar-1951   HPI [name] is here for follow up and re-evaluation of chronic medical conditions, medication management and review of any available recent lab and radiology data.  Preventive health is updated, specifically  Cancer screening and Immunization.  Plans to schedule colonoscopy in the Summer, and , requests xanax for as needed use in limited supply for night time use das she still struggles with her Mom's passing, still intends to see therapist Questions or concerns regarding consultations or procedures which the PT has had in the interim are  addressed. Has 1 year f/u with pulmonary re abn chest scan which is almost entirely cleared, and her rheumatologist is currently trying to taper plaquenil an prednisone as she is recovering from a flare of her RA, she is also transitioning to a new Provider  The PT denies any adverse reactions to current medications since the last visit.  Still c/o intermittent left lower chest pain , had influenza earlier this year , excess cough and weakness, has limited amt of tramadol which she states is benficial   ROS Denies recent fever or chills. Denies sinus pressure, nasal congestion, ear pain or sore throat. Denies chest congestion, productive cough or wheezing. Denies chest pains, palpitations and leg swelling Denies abdominal pain, nausea, vomiting,diarrhea or constipation.   Denies dysuria, frequency, hesitancy or incontinence. Joint pains improved, but generalized in hands, feet and knees. Denies headaches, seizures, numbness, or tingling. Denies depression,still has mild anxiety and  insomnia. Denies skin break down or rash.   PE  BP 120/84   Pulse 97   Resp 16   Ht 5\' 8"  (1.727 m)   Wt 200 lb (90.7 kg)   SpO2 99%   BMI 30.41 kg/m   Patient alert and oriented and in no cardiopulmonary distress.  HEENT: No facial asymmetry, EOMI,   oropharynx pink and moist.  Neck supple no JVD, no  mass.  Chest: Clear to auscultation bilaterally.  CVS: S1, S2 no murmurs, no S3.Regular rate.  ABD: Soft non tender.   Ext: No edema  MS: Adequate ROM spine, shoulders, hips and knees.  Skin: Intact, no ulcerations or rash noted.  Psych: Good eye contact, normal affect. Memory intact not anxious or depressed appearing.  CNS: CN 2-12 intact, power,  normal throughout.no focal deficits noted.   Assessment & Plan  Vitamin D deficiency currently on daily vit D3 5000 IU rept lab in 6 months  Prediabetes Patient educated about the importance of limiting  Carbohydrate intake , the need to commit to daily physical activity for a minimum of 30 minutes , and to commit weight loss. The fact that changes in all these areas will reduce or eliminate all together the development of diabetes is stressed.   Diabetic Labs Latest Ref Rng & Units 07/31/2016 06/08/2016 05/24/2016 11/15/2015 04/11/2015  HbA1c <5.7 % - - 5.5 6.0(H) 6.0(H)  Chol <200 mg/dL - - 180 197 203(H)  HDL >50 mg/dL - - 48(L) 52 42(L)  Calc LDL <100 mg/dL - - 112(H) 129 138(H)  Triglycerides <150 mg/dL - - 100 78 115  Creatinine 0.44 - 1.00 mg/dL 1.30(H) 0.67 0.70 0.83 0.86   BP/Weight 09/15/2016 07/10/2016 06/10/2016 06/08/2016 05/13/2016 01/16/2016 11/29/2374  Systolic BP 283 151 761 607 371 062 694  Diastolic BP 84 80 80 69 90 82 80  Wt. (Lbs) 200 197.08 193 191 199.4 199 200.12  BMI 30.41 29.97 29.35  30.37 30.32 30.26 30.44   No flowsheet data found.  Updated lab needed at/ before next visit.   Insomnia due to stress Sleep hygiene reviewed and written information offered also. Prescription sent for  medication needed.   Essential hypertension Controlled, no change in medication DASH diet and commitment to daily physical activity for a minimum of 30 minutes discussed and encouraged, as a part of hypertension management. The importance of attaining a healthy weight is also discussed.  BP/Weight 09/15/2016 07/10/2016  06/10/2016 06/08/2016 05/13/2016 01/16/2016 2/64/1583  Systolic BP 094 076 808 811 031 594 585  Diastolic BP 84 80 80 69 90 82 80  Wt. (Lbs) 200 197.08 193 191 199.4 199 200.12  BMI 30.41 29.97 29.35 30.37 30.32 30.26 30.44       Abnormal CT scan, chest Repeat scan in 1 year  Obesity (BMI 30-39.9) Deteriorated. Patient re-educated about  the importance of commitment to a  minimum of 150 minutes of exercise per week.  The importance of healthy food choices with portion control discussed. Encouraged to start a food diary, count calories and to consider  joining a support group. Sample diet sheets offered. Goals set by the patient for the next several months.   Weight /BMI 09/15/2016 07/10/2016 06/10/2016  WEIGHT 200 lb 197 lb 1.3 oz 193 lb  HEIGHT 5\' 8"  5\' 8"  5\' 8"   BMI 30.41 kg/m2 29.97 kg/m2 29.35 kg/m2      Rheumatoid arthritis ([facility]) Flare up improved being treated by rheumtologist

## 2016-09-15 NOTE — Assessment & Plan Note (Signed)
currently on daily vit D3 5000 IU rept lab in 6 months

## 2016-09-15 NOTE — Assessment & Plan Note (Signed)
Flare up improved being treated by rheumtologist

## 2016-09-15 NOTE — Assessment & Plan Note (Signed)
Sleep hygiene reviewed and written information offered also. Prescription sent for  medication needed.  

## 2016-10-29 ENCOUNTER — Telehealth: Payer: Self-pay | Admitting: Family Medicine

## 2016-10-29 ENCOUNTER — Other Ambulatory Visit: Payer: Self-pay

## 2016-10-29 MED ORDER — VALSARTAN 80 MG PO TABS: 80.0000 mg | ORAL_TABLET | Freq: Every day | ORAL | 1 refills | Status: DC

## 2016-10-29 NOTE — Telephone Encounter (Signed)
Med refilled.

## 2016-10-29 NOTE — Telephone Encounter (Signed)
Patient is requesting her rx refill for DIOVAN 80 MG  [employer] mail order, she only has enough pills til Sunday. Cb#: [phone]

## 2016-11-23 ENCOUNTER — Encounter: Payer: Self-pay | Admitting: Family Medicine

## 2016-12-22 ENCOUNTER — Encounter: Payer: Self-pay | Admitting: Family Medicine

## 2016-12-23 ENCOUNTER — Other Ambulatory Visit: Payer: Self-pay | Admitting: Family Medicine

## 2016-12-23 ENCOUNTER — Telehealth: Payer: Self-pay

## 2016-12-23 ENCOUNTER — Encounter: Payer: Self-pay | Admitting: Family Medicine

## 2016-12-23 ENCOUNTER — Other Ambulatory Visit: Payer: Self-pay

## 2016-12-23 MED ORDER — LOSARTAN POTASSIUM 50 MG PO TABS: 50.0000 mg | ORAL_TABLET | Freq: Every day | ORAL | 5 refills | Status: DC

## 2016-12-23 MED ORDER — LOSARTAN POTASSIUM 50 MG PO TABS: 50.0000 mg | ORAL_TABLET | Freq: Every day | ORAL | 1 refills | Status: DC

## 2016-12-23 NOTE — Telephone Encounter (Signed)
Cancelled from [place] and sent to requested pharmacies

## 2016-12-23 NOTE — Telephone Encounter (Signed)
Unfortunately I sent to [facility] as was in system, pls resend to appropriate and 90 day to her mail order and let her know, it is cozar 50 mg, thanks

## 2016-12-23 NOTE — Telephone Encounter (Signed)
Patient on diovan 80mg . Needs it changed to alternative. [facility] and a 90 day sent to mail order

## 2017-01-05 ENCOUNTER — Other Ambulatory Visit: Payer: Self-pay | Admitting: Family Medicine

## 2017-01-05 DIAGNOSIS — J3089 Other allergic rhinitis: Secondary | ICD-10-CM

## 2017-02-01 ENCOUNTER — Encounter: Payer: Self-pay | Admitting: Family Medicine

## 2017-02-23 ENCOUNTER — Other Ambulatory Visit: Payer: Self-pay | Admitting: Family Medicine

## 2017-02-23 DIAGNOSIS — Z1231 Encounter for screening mammogram for malignant neoplasm of breast: Secondary | ICD-10-CM

## 2017-02-25 ENCOUNTER — Ambulatory Visit (HOSPITAL_COMMUNITY)
Admission: RE | Admit: 2017-02-25 | Discharge: 2017-02-25 | Disposition: A | Discharge status: Home / Self Care | Payer: 59 | Source: Ambulatory Visit | Attending: Family Medicine | Admitting: Family Medicine

## 2017-02-25 DIAGNOSIS — Z1231 Encounter for screening mammogram for malignant neoplasm of breast: Secondary | ICD-10-CM | POA: Insufficient documentation

## 2017-02-26 ENCOUNTER — Other Ambulatory Visit: Payer: Self-pay | Admitting: Oncology

## 2017-02-26 ENCOUNTER — Other Ambulatory Visit: Payer: Self-pay | Admitting: Family Medicine

## 2017-02-26 DIAGNOSIS — R928 Other abnormal and inconclusive findings on diagnostic imaging of breast: Secondary | ICD-10-CM

## 2017-02-26 DIAGNOSIS — N6310 Unspecified lump in the right breast, unspecified quadrant: Secondary | ICD-10-CM

## 2017-03-10 ENCOUNTER — Ambulatory Visit (HOSPITAL_COMMUNITY)
Admission: RE | Admit: 2017-03-10 | Discharge: 2017-03-10 | Disposition: A | Discharge status: Home / Self Care | Payer: 59 | Source: Ambulatory Visit | Attending: Family Medicine | Admitting: Family Medicine

## 2017-03-10 DIAGNOSIS — R928 Other abnormal and inconclusive findings on diagnostic imaging of breast: Secondary | ICD-10-CM | POA: Diagnosis present

## 2017-03-31 ENCOUNTER — Other Ambulatory Visit: Payer: Self-pay

## 2017-03-31 ENCOUNTER — Encounter: Payer: Managed Care, Other (non HMO) | Admitting: Family Medicine

## 2017-03-31 DIAGNOSIS — J3089 Other allergic rhinitis: Secondary | ICD-10-CM

## 2017-03-31 MED ORDER — MONTELUKAST SODIUM 10 MG PO TABS: 10.0000 mg | ORAL_TABLET | Freq: Every day | ORAL | 1 refills | Status: DC

## 2017-04-09 ENCOUNTER — Other Ambulatory Visit: Payer: Self-pay

## 2017-04-28 ENCOUNTER — Telehealth: Payer: Self-pay

## 2017-04-28 ENCOUNTER — Encounter: Payer: Self-pay | Admitting: Family Medicine

## 2017-04-28 NOTE — Telephone Encounter (Signed)
Walked in office this pm wanting to be seen. C/o coughing spells, some mucus and sneezing, sinus drainage and some hoarseness x Friday. No fever but has tried flonase, zinc and using atrovent 3x daily today. Wants to catch it before it gets bad. [facility] Call back- [phone]

## 2017-04-29 ENCOUNTER — Ambulatory Visit (INDEPENDENT_AMBULATORY_CARE_PROVIDER_SITE_OTHER): Payer: 59

## 2017-04-29 ENCOUNTER — Other Ambulatory Visit: Payer: Self-pay | Admitting: Family Medicine

## 2017-04-29 ENCOUNTER — Telehealth: Payer: Self-pay | Admitting: Family Medicine

## 2017-04-29 DIAGNOSIS — R05 Cough: Secondary | ICD-10-CM

## 2017-04-29 DIAGNOSIS — R059 Cough, unspecified: Secondary | ICD-10-CM

## 2017-04-29 MED ORDER — PREDNISONE 5 MG (21) PO TBPK: 5.0000 mg | ORAL_TABLET | ORAL | 0 refills | Status: DC

## 2017-04-29 MED ORDER — ALBUTEROL SULFATE HFA 108 (90 BASE) MCG/ACT IN AERS: 2.0000 | INHALATION_SPRAY | Freq: Four times a day (QID) | RESPIRATORY_TRACT | 0 refills | Status: DC | PRN

## 2017-04-29 MED ORDER — METHYLPREDNISOLONE ACETATE 80 MG/ML IJ SUSP
80.0000 mg | Freq: Once | INTRAMUSCULAR | Status: AC
  Administered 2017-04-29: 80 mg via INTRAMUSCULAR

## 2017-04-29 NOTE — Telephone Encounter (Signed)
Updated message today

## 2017-04-29 NOTE — Progress Notes (Signed)
Patient received injection IM with no complications

## 2017-04-29 NOTE — Telephone Encounter (Signed)
Please , nurse visit today at 2 pm for Repomedrol 80 mg IM for wheezing and bronchospasm, I have sent 2 medications [facility] [place], prednisone and albuterol, and she is aware  Tbrahankks

## 2017-05-12 ENCOUNTER — Telehealth: Payer: Self-pay | Admitting: Family Medicine

## 2017-05-12 DIAGNOSIS — R7303 Prediabetes: Secondary | ICD-10-CM

## 2017-05-12 DIAGNOSIS — E785 Hyperlipidemia, unspecified: Secondary | ICD-10-CM

## 2017-05-12 DIAGNOSIS — E559 Vitamin D deficiency, unspecified: Secondary | ICD-10-CM

## 2017-05-12 DIAGNOSIS — I10 Essential (primary) hypertension: Secondary | ICD-10-CM

## 2017-05-12 NOTE — Telephone Encounter (Signed)
Patient went to get labs, orders had expired, orders re-placed.

## 2017-05-13 ENCOUNTER — Encounter: Payer: Self-pay | Admitting: Family Medicine

## 2017-05-13 LAB — COMPLETE METABOLIC PANEL WITH GFR
AG Ratio: 1.3 (calc) (ref 1.0–2.5)
ALKALINE PHOSPHATASE (APISO): 79 U/L (ref 33–130)
ALT: 17 U/L (ref 6–29)
AST: 18 U/L (ref 10–35)
Albumin: 3.6 g/dL (ref 3.6–5.1)
BILIRUBIN TOTAL: 0.4 mg/dL (ref 0.2–1.2)
BUN: 13 mg/dL (ref 7–25)
CHLORIDE: 106 mmol/L (ref 98–110)
CO2: 28 mmol/L (ref 20–32)
Calcium: 8.8 mg/dL (ref 8.6–10.4)
Creat: 0.77 mg/dL (ref 0.50–0.99)
GFR, EST NON AFRICAN AMERICAN: 82 mL/min/{1.73_m2} (ref >=60)
GFR, Est African American: 95 mL/min/{1.73_m2} (ref >=60)
GLOBULIN: 2.8 g/dL (ref 1.9–3.7)
Glucose, Bld: 89 mg/dL (ref 65–99)
POTASSIUM: 4.3 mmol/L (ref 3.5–5.3)
SODIUM: 140 mmol/L (ref 135–146)
Total Protein: 6.4 g/dL (ref 6.1–8.1)

## 2017-05-13 LAB — LIPID PANEL
CHOL/HDL RATIO: 2.6 (calc) (ref <5.0)
CHOLESTEROL: 154 mg/dL (ref <200)
HDL: 60 mg/dL (ref >50)
LDL CHOLESTEROL (CALC): 80 mg/dL
NON-HDL CHOLESTEROL (CALC): 94 mg/dL (ref <130)
TRIGLYCERIDES: 56 mg/dL (ref <150)

## 2017-05-13 LAB — HEMOGLOBIN A1C
EAG (MMOL/L): 6.5 (calc)
HEMOGLOBIN A1C: 5.7 %{Hb} — AB (ref <5.7)
MEAN PLASMA GLUCOSE: 117 (calc)

## 2017-05-13 LAB — VITAMIN D 25 HYDROXY (VIT D DEFICIENCY, FRACTURES): Vit D, 25-Hydroxy: 53 ng/mL (ref 30–100)

## 2017-05-14 ENCOUNTER — Other Ambulatory Visit (HOSPITAL_COMMUNITY)
Admission: RE | Admit: 2017-05-14 | Discharge: 2017-05-14 | Disposition: A | Discharge status: Home / Self Care | Payer: 59 | Source: Ambulatory Visit | Attending: Family Medicine | Admitting: Family Medicine

## 2017-05-14 ENCOUNTER — Ambulatory Visit (INDEPENDENT_AMBULATORY_CARE_PROVIDER_SITE_OTHER): Payer: 59 | Admitting: Family Medicine

## 2017-05-14 ENCOUNTER — Encounter: Payer: Self-pay | Admitting: Family Medicine

## 2017-05-14 VITALS — BP 114/70 | HR 97 | Resp 16 | Ht 68.0 in | Wt 206.0 lb

## 2017-05-14 DIAGNOSIS — Z124 Encounter for screening for malignant neoplasm of cervix: Secondary | ICD-10-CM | POA: Diagnosis not present

## 2017-05-14 DIAGNOSIS — H6063 Unspecified chronic otitis externa, bilateral: Secondary | ICD-10-CM | POA: Diagnosis not present

## 2017-05-14 DIAGNOSIS — Z1211 Encounter for screening for malignant neoplasm of colon: Secondary | ICD-10-CM | POA: Diagnosis not present

## 2017-05-14 DIAGNOSIS — I10 Essential (primary) hypertension: Secondary | ICD-10-CM

## 2017-05-14 DIAGNOSIS — E785 Hyperlipidemia, unspecified: Secondary | ICD-10-CM | POA: Diagnosis not present

## 2017-05-14 DIAGNOSIS — Z0000 Encounter for general adult medical examination without abnormal findings: Secondary | ICD-10-CM

## 2017-05-14 DIAGNOSIS — R7303 Prediabetes: Secondary | ICD-10-CM

## 2017-05-14 LAB — POC HEMOCCULT BLD/STL (OFFICE/1-CARD/DIAGNOSTIC): Fecal Occult Blood, POC: NEGATIVE

## 2017-05-14 NOTE — Patient Instructions (Addendum)
Welcome to medicare  In the Fall , call for appointment, call  idf  You need me before  Excellent labs and exam, no med changes  Medication is being sent for ears due to irritation from scratching. It is important that you exercise regularly at least 30 minutes 5 times a week. If you develop chest pain, have severe difficulty breathing, or feel very tired, stop exercising immediately and seek medical attention  Please work on good  health habits so that your health will improve. 1. Commitment to daily physical activity for 30 to 60  minutes, if you are able to do this.  2. Commitment to wise food choices. Aim for half of your  food intake to be vegetable and fruit, one quarter starchy foods, and one quarter protein. Try to eat on a regular schedule  3 meals per day, snacking between meals should be limited to vegetables or fruits or small portions of nuts. 64 ounces of water per day is generally recommended, unless you have specific health conditions, like heart failure or kidney failure where you will need to limit fluid intake.  3. Commitment to sufficient and a  good quality of physical and mental rest daily, generally between 6 to 8 hours per day.  WITH PERSISTANCE AND PERSEVERANCE, THE IMPOSSIBLE , BECOMES THE NORM! Thank you  for choosing [facility]. We consider it a privelige to serve you.  Delivering excellent health care in a caring and  compassionate way is our goal.  Partnering with you,  so that together we can achieve this goal is our strategy.

## 2017-05-15 ENCOUNTER — Encounter: Payer: Self-pay | Admitting: Family Medicine

## 2017-05-15 DIAGNOSIS — H6090 Unspecified otitis externa, unspecified ear: Secondary | ICD-10-CM | POA: Insufficient documentation

## 2017-05-15 MED ORDER — HYDROCORTISONE-ACETIC ACID 1-2 % OT SOLN: OTIC | 0 refills | Status: DC

## 2017-05-15 NOTE — Assessment & Plan Note (Signed)
Irritation of outer ear canals due to trauma from scratching, uses fingertips Advised against this as she is digging deep inside and will prescribe topical anti inflammatory

## 2017-05-15 NOTE — Assessment & Plan Note (Signed)
Annual exam as documented. Counseling done  re healthy lifestyle involving commitment to 150 minutes exercise per week, heart healthy diet, and attaining healthy weight.The importance of adequate sleep also discussed.  Immunization and cancer screening needs are specifically addressed at this visit.  

## 2017-05-15 NOTE — Progress Notes (Addendum)
    [name]     MRN: [id number]      DOB: 05-19-1951  HPI: Patient is in for annual physical exam. Recently treated for acute flare of allergic rhinitis and asthma with DepoMedrol and oral steroids , successfully. Now c/o bilateral itching in outer ears with obvious ear trauma , requests drops Recent labs, are reviewed. Immunization is reviewed , and  Is up to date.   PE: BP 114/70   Pulse 97   Resp 16   Ht 5\' 8"  (1.727 m)   Wt 206 lb (93.4 kg)   SpO2 97%   BMI 31.32 kg/m  Pleasant  female, alert and oriented x 3, in no cardio-pulmonary distress. Afebrile. HEENT No facial trauma or asymetry. Sinuses non tender.  Extra occullar muscles intact, pupils equally reactive to light. External ears normal, tympanic membranes clear.Extrernalauditory canals erythematous due to trauma from fingers Oropharynx moist, no exudate. Neck: supple, no adenopathy,JVD or thyromegaly.No bruits.  Chest: Clear to ascultation bilaterally.No crackles or wheezes. Non tender to palpation  Breast: No asymetry,no masses or lumps. No tenderness. No nipple discharge or inversion. No axillary or supraclavicular adenopathy  Cardiovascular system; Heart sounds normal,  S1 and  S2 ,no S3.  No murmur, or thrill. Apical beat not displaced Peripheral pulses normal.  Abdomen: Soft, non tender, no organomegaly or masses. No bruits. Bowel sounds normal. No guarding, tenderness or rebound.  Rectal:  Normal sphincter tone. No rectal mass. Guaiac negative stool.  GU: External genitalia normal female genitalia , normal female distribution of hair. No lesions. Urethral meatus normal in size, no  Prolapse, no lesions visibly  Present. Bladder non tender. Vagina pink and moist , with no visible lesions , discharge present . Adequate pelvic support no  cystocele or rectocele noted Cervix pink and appears healthy, no lesions or ulcerations noted, no discharge noted from os Uterus normal size, no adnexal  masses, no cervical motion or adnexal tenderness.   Musculoskeletal exam: Full ROM of spine, hips , shoulders and knees. No deformity ,swelling or crepitus noted. No muscle wasting or atrophy.   Neurologic: Cranial nerves 2 to 12 intact. Power, tone ,sensation and reflexes normal throughout. No disturbance in gait. No tremor.  Skin: Intact, no ulceration, erythema , scaling or rash noted. Pigmentation normal throughout  Psych; Normal mood and affect. Judgement and concentration normal   Assessment & Plan:  Annual physical exam Annual exam as documented. Counseling done  re healthy lifestyle involving commitment to 150 minutes exercise per week, heart healthy diet, and attaining healthy weight.The importance of adequate sleep also discussed.  Immunization and cancer screening needs are specifically addressed at this visit.   Otitis externa Irritation of outer ear canals due to trauma from scratching, uses fingertips Advised against this as she is digging deep inside and will prescribe topical anti inflammatory

## 2017-05-21 LAB — CYTOLOGY - PAP
ADEQUACY: ABSENT
Diagnosis: NEGATIVE
HPV: NOT DETECTED

## 2017-05-25 ENCOUNTER — Encounter: Payer: Self-pay | Admitting: Family Medicine

## 2017-05-26 DIAGNOSIS — M810 Age-related osteoporosis without current pathological fracture: Secondary | ICD-10-CM

## 2017-05-26 HISTORY — DX: Age-related osteoporosis without current pathological fracture: M81.0

## 2017-06-30 ENCOUNTER — Other Ambulatory Visit: Payer: Self-pay

## 2017-06-30 MED ORDER — LOSARTAN POTASSIUM 50 MG PO TABS: 50.0000 mg | ORAL_TABLET | Freq: Every day | ORAL | 1 refills | Status: DC

## 2017-07-01 ENCOUNTER — Other Ambulatory Visit: Payer: Self-pay | Admitting: Family Medicine

## 2017-07-01 DIAGNOSIS — J3089 Other allergic rhinitis: Secondary | ICD-10-CM

## 2017-07-01 NOTE — Telephone Encounter (Signed)
Seen 12 20 18

## 2017-07-02 ENCOUNTER — Encounter: Payer: Self-pay | Admitting: Family Medicine

## 2017-07-02 DIAGNOSIS — E785 Hyperlipidemia, unspecified: Secondary | ICD-10-CM

## 2017-07-02 MED ORDER — PRAVASTATIN SODIUM 10 MG PO TABS: ORAL_TABLET | ORAL | 1 refills | Status: DC

## 2017-08-17 ENCOUNTER — Other Ambulatory Visit (HOSPITAL_COMMUNITY): Payer: Self-pay | Admitting: Pulmonary Disease

## 2017-08-17 DIAGNOSIS — R9389 Abnormal findings on diagnostic imaging of other specified body structures: Secondary | ICD-10-CM

## 2017-08-20 ENCOUNTER — Ambulatory Visit (HOSPITAL_COMMUNITY)
Admission: RE | Admit: 2017-08-20 | Discharge: 2017-08-20 | Disposition: A | Discharge status: Home / Self Care | Payer: PRIVATE HEALTH INSURANCE | Source: Ambulatory Visit | Attending: Pulmonary Disease | Admitting: Pulmonary Disease

## 2017-08-20 DIAGNOSIS — I700 Atherosclerosis of aorta: Secondary | ICD-10-CM | POA: Diagnosis not present

## 2017-08-20 DIAGNOSIS — I2510 Atherosclerotic heart disease of native coronary artery without angina pectoris: Secondary | ICD-10-CM | POA: Diagnosis not present

## 2017-08-20 DIAGNOSIS — R9389 Abnormal findings on diagnostic imaging of other specified body structures: Secondary | ICD-10-CM

## 2017-08-20 DIAGNOSIS — K449 Diaphragmatic hernia without obstruction or gangrene: Secondary | ICD-10-CM | POA: Diagnosis not present

## 2017-08-20 DIAGNOSIS — R918 Other nonspecific abnormal finding of lung field: Secondary | ICD-10-CM | POA: Insufficient documentation

## 2017-08-20 DIAGNOSIS — J432 Centrilobular emphysema: Secondary | ICD-10-CM | POA: Insufficient documentation

## 2017-08-31 ENCOUNTER — Ambulatory Visit (HOSPITAL_COMMUNITY): Payer: PRIVATE HEALTH INSURANCE

## 2017-10-02 ENCOUNTER — Telehealth: Payer: Self-pay | Admitting: Family Medicine

## 2017-10-02 NOTE — Telephone Encounter (Signed)
Patient stopped in stating that Losartan is recalled and she is worried that her medication may be on that list. She is on losartan potassium tablets 50mg   Cb#: [id number]/ [phone] [facility] [address]

## 2017-10-05 NOTE — Telephone Encounter (Signed)
Patient spoke with pharmacy and is going to continue the medication

## 2017-10-26 ENCOUNTER — Other Ambulatory Visit: Payer: Self-pay | Admitting: Family Medicine

## 2017-10-26 ENCOUNTER — Encounter: Payer: Self-pay | Admitting: Family Medicine

## 2017-10-26 DIAGNOSIS — M25511 Pain in right shoulder: Secondary | ICD-10-CM

## 2017-10-26 MED ORDER — TRAMADOL HCL 50 MG PO TABS: ORAL_TABLET | ORAL | 0 refills | Status: DC

## 2017-10-26 MED ORDER — PREDNISONE 5 MG (21) PO TBPK: 5.0000 mg | ORAL_TABLET | ORAL | 0 refills | Status: DC

## 2017-10-26 NOTE — Progress Notes (Signed)
Pt with acute shoulder pain and reduced mpobility, prednisone dose pack and tramadol prescribed and also X ray of shoulder ordered, pt is aware

## 2017-10-27 ENCOUNTER — Encounter: Payer: Self-pay | Admitting: Family Medicine

## 2017-10-27 ENCOUNTER — Ambulatory Visit (HOSPITAL_COMMUNITY)
Admission: RE | Admit: 2017-10-27 | Discharge: 2017-10-27 | Disposition: A | Discharge status: Home / Self Care | Payer: 59 | Source: Ambulatory Visit | Attending: Family Medicine | Admitting: Family Medicine

## 2017-10-27 DIAGNOSIS — M25511 Pain in right shoulder: Secondary | ICD-10-CM | POA: Diagnosis present

## 2017-12-21 ENCOUNTER — Other Ambulatory Visit: Payer: Self-pay

## 2017-12-21 ENCOUNTER — Encounter: Payer: Self-pay | Admitting: Family Medicine

## 2017-12-21 ENCOUNTER — Ambulatory Visit (INDEPENDENT_AMBULATORY_CARE_PROVIDER_SITE_OTHER): Payer: Medicare Other | Admitting: Family Medicine

## 2017-12-21 VITALS — BP 112/68 | HR 96 | Resp 12 | Ht 67.0 in | Wt 203.1 lb

## 2017-12-21 DIAGNOSIS — F419 Anxiety disorder, unspecified: Secondary | ICD-10-CM

## 2017-12-21 DIAGNOSIS — J3089 Other allergic rhinitis: Secondary | ICD-10-CM

## 2017-12-21 DIAGNOSIS — E669 Obesity, unspecified: Secondary | ICD-10-CM | POA: Diagnosis not present

## 2017-12-21 DIAGNOSIS — Z23 Encounter for immunization: Secondary | ICD-10-CM | POA: Diagnosis not present

## 2017-12-21 DIAGNOSIS — Z780 Asymptomatic menopausal state: Secondary | ICD-10-CM

## 2017-12-21 DIAGNOSIS — R7303 Prediabetes: Secondary | ICD-10-CM

## 2017-12-21 DIAGNOSIS — E785 Hyperlipidemia, unspecified: Secondary | ICD-10-CM | POA: Diagnosis not present

## 2017-12-21 DIAGNOSIS — M79671 Pain in right foot: Secondary | ICD-10-CM

## 2017-12-21 DIAGNOSIS — I10 Essential (primary) hypertension: Secondary | ICD-10-CM

## 2017-12-21 DIAGNOSIS — E559 Vitamin D deficiency, unspecified: Secondary | ICD-10-CM

## 2017-12-21 DIAGNOSIS — Z1231 Encounter for screening mammogram for malignant neoplasm of breast: Secondary | ICD-10-CM

## 2017-12-21 MED ORDER — PREDNISONE 20 MG PO TABS: 20.0000 mg | ORAL_TABLET | Freq: Two times a day (BID) | ORAL | 0 refills | Status: DC

## 2017-12-21 MED ORDER — KETOROLAC TROMETHAMINE 60 MG/2ML IM SOLN
60.0000 mg | Freq: Once | INTRAMUSCULAR | Status: AC
  Administered 2017-12-21: 60 mg via INTRAMUSCULAR

## 2017-12-21 MED ORDER — LOSARTAN POTASSIUM 50 MG PO TABS: 50.0000 mg | ORAL_TABLET | Freq: Every day | ORAL | 1 refills | Status: DC

## 2017-12-21 MED ORDER — ALPRAZOLAM 0.25 MG PO TABS: ORAL_TABLET | ORAL | 0 refills | Status: DC

## 2017-12-21 MED ORDER — MONTELUKAST SODIUM 10 MG PO TABS: 10.0000 mg | ORAL_TABLET | Freq: Every day | ORAL | 1 refills | Status: DC

## 2017-12-21 MED ORDER — PRAVASTATIN SODIUM 10 MG PO TABS: ORAL_TABLET | ORAL | 1 refills | Status: DC

## 2017-12-21 MED ORDER — METHYLPREDNISOLONE ACETATE 80 MG/ML IJ SUSP
80.0000 mg | Freq: Once | INTRAMUSCULAR | Status: AC
  Administered 2017-12-21: 80 mg via INTRAMUSCULAR

## 2017-12-21 NOTE — Assessment & Plan Note (Signed)
After obtaining informed consent, the vaccine is  administered by LPN.  

## 2017-12-21 NOTE — Progress Notes (Signed)
[name]     MRN: [id number]      DOB: 1950/11/05   HPI [name] is here for follow up and re-evaluation of chronic medical conditions, medication management and review of any available recent lab and radiology data.  Preventive health is updated, specifically  Cancer screening and Immunization.   Questions or concerns regarding consultations or procedures which the PT has had in the interim are  addressed. The PT denies any adverse reactions to current medications since the last visit.  Right heel pain x 5 days  With tenderness pain from 7  To 8, has used tramadol and has been using a cane intermittently Requests xanax for bedtime use as she is currently under stress worrying about her sister's health and finances And  she now realizes that she will need to  return to part time work to help to support her during her illness ROS Denies recent fever or chills. Denies sinus pressure, nasal congestion, ear pain or sore throat. Denies chest congestion, productive cough or wheezing. Denies chest pains, palpitations and leg swelling Denies abdominal pain, nausea, vomiting,diarrhea or constipation.   Denies dysuria, frequency, hesitancy or incontinence. . Denies headaches, seizures, numbness, or tingling. Denies skin break down or rash.   PE  BP 112/68 (BP Location: Left Arm, Patient Position: Sitting, Cuff Size: Normal)   Pulse 96   Ht 5\' 7"  (1.702 m)   Wt 203 lb 1.3 oz (92.1 kg)   SpO2 97%   BMI 31.81 kg/m   Patient alert and oriented and in no cardiopulmonary distress.  HEENT: No facial asymmetry, EOMI,   oropharynx pink and moist.  Neck supple no JVD, no mass.  Chest: Clear to auscultation bilaterally.  CVS: S1, S2 no murmurs, no S3.Regular rate.  ABD: Soft non tender.   Ext: No edema  MS: Adequate ROM spine, shoulders, hips and knees.  Skin: Intact, no ulcerations or rash noted.  Psych: Good eye contact, normal affect. Memory intact mildly  anxious not   depressed appearing.  CNS: CN 2-12 intact, power,  normal throughout.no focal deficits noted.   Assessment & Plan  Need for vaccination with 13-polyvalent pneumococcal conjugate vaccine After obtaining informed consent, the vaccine is  administered by LPN.     Pain of right heel 5 day h/o acute right heel pain Uncontrolled.Toradol and depo medrol administered IM in the office , to be followed by a short course of oral prednisone   Essential hypertension Controlled, no change in medication DASH diet and commitment to daily physical activity for a minimum of 30 minutes discussed and encouraged, as a part of hypertension management. The importance of attaining a healthy weight is also discussed.  BP/Weight 12/21/2017 05/14/2017 09/15/2016 07/10/2016 06/10/2016 06/08/2016 31/49/7026  Systolic BP 378 588 502 774 128 786 767  Diastolic BP 68 70 84 80 80 69 90  Wt. (Lbs) 203.08 206 200 197.08 193 191 199.4  BMI 31.81 31.32 30.41 29.97 29.35 30.37 30.32       Obesity (BMI 30-39.9) Improved Patient re-educated about  the importance of commitment to a  minimum of 150 minutes of exercise per week.  The importance of healthy food choices with portion control discussed. Encouraged to start a food diary, count calories and to consider  joining a support group. Sample diet sheets offered. Goals set by the patient for the next several months.   Weight /BMI 12/21/2017 05/14/2017 09/15/2016  WEIGHT 203 lb 1.3 oz 206 lb 200 lb  HEIGHT  5\' 7"  5\' 8"  5\' 8"   BMI 31.81 kg/m2 31.32 kg/m2 30.41 kg/m2      Anxiety Increased anxiety with poor sleep due to current ill health of her sibling , bedtime low dose xanax  As needed is prescribed fo short term use

## 2017-12-21 NOTE — Assessment & Plan Note (Signed)
5 day h/o acute right heel pain Uncontrolled.Toradol and depo medrol administered IM in the office , to be followed by a short course of oral prednisone

## 2017-12-21 NOTE — Patient Instructions (Addendum)
Welcome to medicare with MD  early December, call if you need me before  Please schedule at checkout mammogram due Oct 18 or after and also bone density test    Toradol 60 mg and Depo Medrol 80 mg IM today for right heel pain and 5  Days of prednisone is prescribed   Prevnar vaccine today  Blood pressure good, continue current meds  Fasting lipid, cmp and EGFR, cBC, Vit D and TSH `1 1 week before Dec appt  meds to be refilled  It is important that you exercise regularly at least 30 minutes 5 times a week. If you develop chest pain, have severe difficulty breathing, or feel very tired, stop exercising immediately and seek medical attention

## 2017-12-27 ENCOUNTER — Encounter: Payer: Self-pay | Admitting: Family Medicine

## 2017-12-27 NOTE — Assessment & Plan Note (Signed)
Improved Patient re-educated about  the importance of commitment to a  minimum of 150 minutes of exercise per week.  The importance of healthy food choices with portion control discussed. Encouraged to start a food diary, count calories and to consider  joining a support group. Sample diet sheets offered. Goals set by the patient for the next several months.   Weight /BMI 12/21/2017 05/14/2017 09/15/2016  WEIGHT 203 lb 1.3 oz 206 lb 200 lb  HEIGHT 5\' 7"  5\' 8"  5\' 8"   BMI 31.81 kg/m2 31.32 kg/m2 30.41 kg/m2

## 2017-12-27 NOTE — Assessment & Plan Note (Signed)
Increased anxiety with poor sleep due to current ill health of her sibling , bedtime low dose xanax  As needed is prescribed fo short term use

## 2017-12-27 NOTE — Assessment & Plan Note (Signed)
Controlled, no change in medication DASH diet and commitment to daily physical activity for a minimum of 30 minutes discussed and encouraged, as a part of hypertension management. The importance of attaining a healthy weight is also discussed.  BP/Weight 12/21/2017 05/14/2017 09/15/2016 07/10/2016 06/10/2016 06/08/2016 30/85/6943  Systolic BP 700 525 910 289 022 840 698  Diastolic BP 68 70 84 80 80 69 90  Wt. (Lbs) 203.08 206 200 197.08 193 191 199.4  BMI 31.81 31.32 30.41 29.97 29.35 30.37 30.32

## 2018-01-01 ENCOUNTER — Other Ambulatory Visit: Payer: Self-pay | Admitting: Family Medicine

## 2018-02-09 ENCOUNTER — Encounter: Payer: Self-pay | Admitting: Family Medicine

## 2018-02-10 ENCOUNTER — Telehealth: Payer: Self-pay | Admitting: Family Medicine

## 2018-02-10 MED ORDER — TRAMADOL HCL 50 MG PO TABS: ORAL_TABLET | ORAL | 0 refills | Status: DC

## 2018-02-10 NOTE — Telephone Encounter (Signed)
I called and spoke with the pt, main c/o knee pain , left, has appt with Rheumatology tomorrow at the same time as we gave her , I advised that this will be better for her as the problem is joint related , rheumatology can inject the joint. I will send tramadol to [facility] as this relieves her pain somewhat

## 2018-02-11 ENCOUNTER — Ambulatory Visit: Payer: Medicare Other | Admitting: Family Medicine

## 2018-02-11 DIAGNOSIS — M0579 Rheumatoid arthritis with rheumatoid factor of multiple sites without organ or systems involvement: Secondary | ICD-10-CM | POA: Diagnosis not present

## 2018-02-11 DIAGNOSIS — M150 Primary generalized (osteo)arthritis: Secondary | ICD-10-CM | POA: Diagnosis not present

## 2018-02-11 DIAGNOSIS — Z79899 Other long term (current) drug therapy: Secondary | ICD-10-CM | POA: Diagnosis not present

## 2018-02-11 DIAGNOSIS — M2550 Pain in unspecified joint: Secondary | ICD-10-CM | POA: Diagnosis not present

## 2018-02-11 DIAGNOSIS — E669 Obesity, unspecified: Secondary | ICD-10-CM | POA: Diagnosis not present

## 2018-02-11 DIAGNOSIS — H209 Unspecified iridocyclitis: Secondary | ICD-10-CM | POA: Diagnosis not present

## 2018-02-11 DIAGNOSIS — Z6832 Body mass index (BMI) 32.0-32.9, adult: Secondary | ICD-10-CM | POA: Diagnosis not present

## 2018-02-12 ENCOUNTER — Ambulatory Visit (INDEPENDENT_AMBULATORY_CARE_PROVIDER_SITE_OTHER): Payer: BC Managed Care – PPO

## 2018-02-12 DIAGNOSIS — Z23 Encounter for immunization: Secondary | ICD-10-CM | POA: Diagnosis not present

## 2018-03-04 ENCOUNTER — Ambulatory Visit (INDEPENDENT_AMBULATORY_CARE_PROVIDER_SITE_OTHER): Payer: BC Managed Care – PPO | Admitting: Family Medicine

## 2018-03-04 ENCOUNTER — Encounter: Payer: Self-pay | Admitting: Family Medicine

## 2018-03-04 VITALS — BP 126/76 | HR 89 | Resp 12 | Ht 67.0 in | Wt 206.1 lb

## 2018-03-04 DIAGNOSIS — J0100 Acute maxillary sinusitis, unspecified: Secondary | ICD-10-CM | POA: Diagnosis not present

## 2018-03-04 DIAGNOSIS — E669 Obesity, unspecified: Secondary | ICD-10-CM

## 2018-03-04 DIAGNOSIS — M25562 Pain in left knee: Secondary | ICD-10-CM | POA: Diagnosis not present

## 2018-03-04 DIAGNOSIS — I10 Essential (primary) hypertension: Secondary | ICD-10-CM

## 2018-03-04 MED ORDER — AZITHROMYCIN 250 MG PO TABS: ORAL_TABLET | ORAL | 0 refills | Status: DC

## 2018-03-04 MED ORDER — FLUCONAZOLE 150 MG PO TABS: ORAL_TABLET | ORAL | 0 refills | Status: DC

## 2018-03-04 NOTE — Patient Instructions (Signed)
F/U as before,Ms., call if you need me before  Z pack and  Fluconazole sent to [facility] for sinus infection  You are being referred to Orthopedics in [place], the office will call you or we will   Thank you  for choosing [facility]. We consider it a privelige to serve you.  Delivering excellent health care in a caring and  compassionate way is our goal.  Partnering with you,  so that together we can achieve this goal is our strategy.

## 2018-03-04 NOTE — Assessment & Plan Note (Addendum)
4 week h/o debilitating left knee pain,no associated trauma, tender over medial aspect, was treated with oral prednisone by rheumatology , however has instability and ongoing pain , using a knee brace, pillow between legs, pain rates from 6 to 8, needs ortho

## 2018-03-04 NOTE — Progress Notes (Signed)
   [name]     MRN: [id number]      DOB: May 08, 1951   HPI Ms. Pattison is here c/o uncontrolled knee pain, left and instability, localized to ,medial aspect. She was treated by her rheumatologist with oral prednisone in the hope that the problem with the knee would also be taken care of several weeks ago, this unfortunately has not been the case so needs ortho managemen 3 day h/o sore throat, right ear and maxillary pressure, possible low grade temp possible, also no cough  ROS  Denies chest congestion, productive cough or wheezing. Denies chest pains, palpitations and leg swelling Denies abdominal pain, nausea, vomiting,diarrhea or constipation.   Denies dysuria, frequency, hesitancy or incontinence.  Denies headaches, seizures, numbness, or tingling. Denies depression, anxiety or insomnia. Denies skin break down or rash.   PE  BP 126/76 (BP Location: Left Arm, Patient Position: Sitting, Cuff Size: Large)   Pulse 89   Resp 12   Ht 5\' 7"  (1.702 m)   Wt 206 lb 1.3 oz (93.5 kg)   SpO2 98% Comment: room air  BMI 32.28 kg/m   Patient alert and oriented and in no cardiopulmonary distress.  HEENT: No facial asymmetry, EOMI,   oropharynx pink and moist.  Neck supple no JVD, right anterior cervical adenitis.Right TM dull and mildly erythematous, right maxillary sinus is tender  Chest: Clear to auscultation bilaterally.  CVS: S1, S2 no murmurs, no S3.Regular rate.  ABD: Soft non tender.   Ext: No edema  MS: Adequate ROM spine, shoulders, hips and reduced in left   Knee with localized tenderness on medical aspect.  Skin: Intact, no ulcerations or rash noted.  Psych: Good eye contact, normal affect. Memory intact not anxious or depressed appearing.  CNS: CN 2-12 intact, power,  normal throughout.no focal deficits noted.   Assessment & Plan  Acute pain of left knee 4 week h/o debilitating left knee pain,no associated trauma, tender over medial aspect, was treated with  oral prednisone by rheumatology , however has instability and ongoing pain , using a knee brace, pillow between legs, pain rates from 6 to 8, needs ortho  Maxillary sinusitis, acute Z pack prescribed  Essential hypertension Controlled, no change in medication DASH diet and commitment to daily physical activity for a minimum of 30 minutes discussed and encouraged, as a part of hypertension management. The importance of attaining a healthy weight is also discussed.  BP/Weight 03/04/2018 12/21/2017 05/14/2017 09/15/2016 07/10/2016 06/10/2016 01/10/5630  Systolic BP 497 026 378 588 502 774 128  Diastolic BP 76 68 70 84 80 80 69  Wt. (Lbs) 206.08 203.08 206 200 197.08 193 191  BMI 32.28 31.81 31.32 30.41 29.97 29.35 30.37       Obesity (BMI 30-39.9) Deteriorated. Patient re-educated about  the importance of commitment to a  minimum of 150 minutes of exercise per week.  The importance of healthy food choices with portion control discussed. Encouraged to start a food diary, count calories and to consider  joining a support group. Sample diet sheets offered. Goals set by the patient for the next several months.   Weight /BMI 03/04/2018 12/21/2017 05/14/2017  WEIGHT 206 lb 1.3 oz 203 lb 1.3 oz 206 lb  HEIGHT 5\' 7"  5\' 7"  5\' 8"   BMI 32.28 kg/m2 31.81 kg/m2 31.32 kg/m2

## 2018-03-05 ENCOUNTER — Encounter: Payer: Self-pay | Admitting: Family Medicine

## 2018-03-05 NOTE — Assessment & Plan Note (Signed)
Deteriorated. Patient re-educated about  the importance of commitment to a  minimum of 150 minutes of exercise per week.  The importance of healthy food choices with portion control discussed. Encouraged to start a food diary, count calories and to consider  joining a support group. Sample diet sheets offered. Goals set by the patient for the next several months.   Weight /BMI 03/04/2018 12/21/2017 05/14/2017  WEIGHT 206 lb 1.3 oz 203 lb 1.3 oz 206 lb  HEIGHT 5\' 7"  5\' 7"  5\' 8"   BMI 32.28 kg/m2 31.81 kg/m2 31.32 kg/m2

## 2018-03-05 NOTE — Assessment & Plan Note (Signed)
Controlled, no change in medication DASH diet and commitment to daily physical activity for a minimum of 30 minutes discussed and encouraged, as a part of hypertension management. The importance of attaining a healthy weight is also discussed.  BP/Weight 03/04/2018 12/21/2017 05/14/2017 09/15/2016 07/10/2016 06/10/2016 09/16/2007  Systolic BP 417 919 957 900 920 041 593  Diastolic BP 76 68 70 84 80 80 69  Wt. (Lbs) 206.08 203.08 206 200 197.08 193 191  BMI 32.28 31.81 31.32 30.41 29.97 29.35 30.37

## 2018-03-05 NOTE — Assessment & Plan Note (Signed)
Z pack prescribed 

## 2018-03-17 ENCOUNTER — Encounter (INDEPENDENT_AMBULATORY_CARE_PROVIDER_SITE_OTHER): Payer: Self-pay | Admitting: Orthopedic Surgery

## 2018-03-17 ENCOUNTER — Ambulatory Visit (INDEPENDENT_AMBULATORY_CARE_PROVIDER_SITE_OTHER): Payer: Medicare Other

## 2018-03-17 ENCOUNTER — Ambulatory Visit (INDEPENDENT_AMBULATORY_CARE_PROVIDER_SITE_OTHER): Payer: Medicare Other | Admitting: Orthopedic Surgery

## 2018-03-17 DIAGNOSIS — M25562 Pain in left knee: Secondary | ICD-10-CM

## 2018-03-18 ENCOUNTER — Encounter (INDEPENDENT_AMBULATORY_CARE_PROVIDER_SITE_OTHER): Payer: Self-pay | Admitting: Orthopedic Surgery

## 2018-03-18 DIAGNOSIS — M25562 Pain in left knee: Secondary | ICD-10-CM | POA: Diagnosis not present

## 2018-03-18 MED ORDER — LIDOCAINE HCL 1 % IJ SOLN
5.0000 mL | INTRAMUSCULAR | Status: AC | PRN
  Administered 2018-03-18: 5 mL

## 2018-03-18 MED ORDER — METHYLPREDNISOLONE ACETATE 40 MG/ML IJ SUSP
40.0000 mg | INTRAMUSCULAR | Status: AC | PRN
  Administered 2018-03-18: 40 mg via INTRA_ARTICULAR

## 2018-03-18 MED ORDER — BUPIVACAINE HCL 0.25 % IJ SOLN
4.0000 mL | INTRAMUSCULAR | Status: AC | PRN
  Administered 2018-03-18: 4 mL via INTRA_ARTICULAR

## 2018-03-18 NOTE — Progress Notes (Signed)
Office Visit Note   Patient: [name]           Date of Birth: 01/16/1951           MRN: [id number] Visit Date: 03/17/2018 Requested by: Fayrene Helper, MD [address], [place], [place] [address] PCP: Fayrene Helper, MD  Subjective: Chief Complaint  Patient presents with  . Left Knee - Pain    HPI: [name] is a patient with left knee pain.  She has a history of rheumatoid arthritis but this primarily affects her ankles and wrists.  She reports atraumatic onset of left knee pain where it is been tender to touch on the medial aspect.  She was on prednisone which was helpful but the pain is recurred since she has been off prednisone.  She is taking Tylenol and also using ice.  She takes Plaquenil for her rheumatoid arthritis.  She denies any new mechanical symptoms.              ROS: All systems reviewed are negative as they relate to the chief complaint within the history of present illness.  Patient denies  fevers or chills.   Assessment & Plan: Visit Diagnoses:  1. Acute pain of left knee     Plan: Impression is left knee pain and effusion 6 weeks duration.  Plan is aspiration and injection into the knee.  I think that we will help with any temporary inflammation that may be going on.  She may also have degenerative meniscal tear present but she is not really having any mechanical symptoms.  I think this would be a good reset for this left knee inflammation.  There is no right knee effusion.  She is going to see her rheumatologist in a couple of weeks and if her symptoms have not improved I would favor MRI scanning at that time to see if there is something arthroscopically treatable in the knee.  Follow-Up Instructions: Return if symptoms worsen or fail to improve.   Orders:  Orders Placed This Encounter  Procedures  . XR Knee 1-2 Views Left   No orders of the defined types were placed in this encounter.     Procedures: Large Joint Inj: R knee on  03/18/2018 9:51 AM Indications: diagnostic evaluation, joint swelling and pain Details: 18 G 1.5 in needle, superolateral approach  Arthrogram: No  Medications: 5 mL lidocaine 1 %; 40 mg methylPREDNISolone acetate 40 MG/ML; 4 mL bupivacaine 0.25 % Aspirate: 10 mL serous Outcome: tolerated well, no immediate complications Procedure, treatment alternatives, risks and benefits explained, specific risks discussed. Consent was given by the patient. Immediately prior to procedure a time out was called to verify the correct patient, procedure, equipment, support staff and site/side marked as required. Patient was prepped and draped in the usual sterile fashion.       Clinical Data: No additional findings.  Objective: Vital Signs: There were no vitals taken for this visit.  Physical Exam:   Constitutional: Patient appears well-developed HEENT:  Head: Normocephalic Eyes:EOM are normal Neck: Normal range of motion Cardiovascular: Normal rate Pulmonary/chest: Effort normal Neurologic: Patient is alert Skin: Skin is warm Psychiatric: Patient has normal mood and affect    Ortho Exam: Ortho exam demonstrates full range of motion of the left knee with mild effusion present.  No effusion in the right knee.  Collateral crucial ligaments are stable.  No masses lymph adenopathy or skin changes noted in that knee region.  There is  some medial joint line tenderness on the left but not on the right.  No patellofemoral crepitus is present.  Specialty Comments:  No specialty comments available.  Imaging: Xr Knee 1-2 Views Left  Result Date: 03/18/2018 AP lateral left knee reviewed.  Mild medial joint space narrowing is present.  Mild osteopenia present.  No acute fracture dislocation seen.    PMFS History: Patient Active Problem List   Diagnosis Date Noted  . Acute pain of left knee 03/04/2018  . Pain of right heel 12/21/2017  . Abnormal CT scan, chest 06/10/2016  . Rheumatoid  arthritis ([place]) 05/13/2016  . GERD (gastroesophageal reflux disease) 03/24/2014  . Maxillary sinusitis, acute 02/26/2013  . Obesity (BMI 30-39.9) 12/28/2011  . Metabolic syndrome X 95/28/4132  . Prediabetes 09/28/2011  . Anxiety 09/23/2011  . Allergic rhinitis 09/04/2009  . Hyperlipidemia LDL goal <100 08/12/2007  . Essential hypertension 08/12/2007   Past Medical History:  Diagnosis Date  . Arthritis, rheumatoid (HCC)    hx  . Collagen vascular disease (HCC)    RA  . Hyperlipidemia   . Hypertension   . Obesity   . 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 Arthritis Mother   . Hypertension Mother   . Colitis Sister        allergies   . Migraines Sister   . Hypertension Sister   . Allergies Brother   . Asthma Brother     Past Surgical History:  Procedure Laterality Date  . APPENDECTOMY  1983  . LITHOTRIPSY  2000   rt renal stone    Social History   Occupational History  . Occupation: teaching [employer]  Tobacco Use  . Smoking status: Former Smoker    Types: Cigarettes  . Smokeless tobacco: Never Used  Substance and Sexual Activity  . Alcohol use: No  . Drug use: No  . Sexual activity: Not on file

## 2018-03-31 ENCOUNTER — Encounter (HOSPITAL_COMMUNITY): Payer: Self-pay

## 2018-03-31 ENCOUNTER — Ambulatory Visit (HOSPITAL_COMMUNITY)
Admission: RE | Admit: 2018-03-31 | Discharge: 2018-03-31 | Disposition: A | Discharge status: Home / Self Care | Payer: Medicare Other | Source: Ambulatory Visit | Attending: Family Medicine | Admitting: Family Medicine

## 2018-03-31 DIAGNOSIS — M810 Age-related osteoporosis without current pathological fracture: Secondary | ICD-10-CM | POA: Insufficient documentation

## 2018-03-31 DIAGNOSIS — M818 Other osteoporosis without current pathological fracture: Secondary | ICD-10-CM | POA: Diagnosis not present

## 2018-03-31 DIAGNOSIS — Z1231 Encounter for screening mammogram for malignant neoplasm of breast: Secondary | ICD-10-CM | POA: Diagnosis not present

## 2018-03-31 DIAGNOSIS — Z780 Asymptomatic menopausal state: Secondary | ICD-10-CM | POA: Diagnosis not present

## 2018-04-02 ENCOUNTER — Encounter: Payer: Self-pay | Admitting: Family Medicine

## 2018-04-02 ENCOUNTER — Other Ambulatory Visit: Payer: Self-pay | Admitting: Family Medicine

## 2018-04-02 ENCOUNTER — Telehealth: Payer: Self-pay

## 2018-04-02 MED ORDER — ALENDRONATE SODIUM 70 MG PO TABS: 70.0000 mg | ORAL_TABLET | ORAL | 3 refills | Status: DC

## 2018-04-02 NOTE — Progress Notes (Signed)
Spoke with patient she is aware

## 2018-04-02 NOTE — Telephone Encounter (Signed)
Spoke with patient she is aware. Advised me that she is no longer with [employer] and does not have the mail order pharmacy. Wants Fosamax sent to [facility]

## 2018-04-28 ENCOUNTER — Other Ambulatory Visit: Payer: Self-pay

## 2018-04-28 ENCOUNTER — Encounter (INDEPENDENT_AMBULATORY_CARE_PROVIDER_SITE_OTHER): Payer: Self-pay | Admitting: *Deleted

## 2018-04-28 ENCOUNTER — Ambulatory Visit (INDEPENDENT_AMBULATORY_CARE_PROVIDER_SITE_OTHER): Payer: Medicare Other | Admitting: Family Medicine

## 2018-04-28 ENCOUNTER — Encounter: Payer: Self-pay | Admitting: Family Medicine

## 2018-04-28 VITALS — BP 132/86 | HR 90 | Resp 12 | Ht 67.0 in | Wt 203.0 lb

## 2018-04-28 DIAGNOSIS — Z1211 Encounter for screening for malignant neoplasm of colon: Secondary | ICD-10-CM

## 2018-04-28 DIAGNOSIS — Z0000 Encounter for general adult medical examination without abnormal findings: Secondary | ICD-10-CM | POA: Diagnosis not present

## 2018-04-28 DIAGNOSIS — M810 Age-related osteoporosis without current pathological fracture: Secondary | ICD-10-CM | POA: Insufficient documentation

## 2018-04-28 LAB — POCT URINALYSIS DIP (CLINITEK)
Bilirubin, UA: NEGATIVE
Blood, UA: NEGATIVE
Glucose, UA: NEGATIVE mg/dL
Ketones, POC UA: NEGATIVE mg/dL
LEUKOCYTES UA: NEGATIVE
NITRITE UA: NEGATIVE
PH UA: 6 (ref 5.0–8.0)
POC PROTEIN,UA: NEGATIVE
Spec Grav, UA: >=1.03 — AB (ref 1.010–1.025)
UROBILINOGEN UA: 0.2 U/dL

## 2018-04-28 MED ORDER — HYDROXYZINE HCL 50 MG PO TABS: ORAL_TABLET | ORAL | 5 refills | Status: DC

## 2018-04-28 NOTE — Patient Instructions (Addendum)
[name] , Thank you for taking time to come for your Medicare Wellness Visit. I appreciate your ongoing commitment to your health goals. Please review the following plan we discussed and let me know if I can assist you in the future.   Screening recommendations/referrals: Colonoscopy: overdue  Mammogram: up to date  Bone Density: up to date  Recommended yearly ophthalmology/optometry visit for glaucoma screening and checkup Recommended yearly dental visit for hygiene and checkup  Vaccinations: Influenza vaccine: up to date  Pneumococcal vaccine: #2 due  Tdap vaccine: up to date  Shingles vaccine: postponed     Advanced directives: Refused   Conditions/risks identified: Stress with family, hypertension  Next appointment: Wellness visit in one year    Preventive Care 65 Years and Older, Female Preventive care refers to lifestyle choices and visits with your health care provider that can promote health and wellness. What does preventive care include?  A yearly physical exam. This is also called an annual well check.  Dental exams once or twice a year.  Routine eye exams. Ask your health care provider how often you should have your eyes checked.  Personal lifestyle choices, including:  Daily care of your teeth and gums.  Regular physical activity.  Eating a healthy diet.  Avoiding tobacco and drug use.  Limiting alcohol use.  Practicing safe sex.  Taking low-dose aspirin every day.  Taking vitamin and mineral supplements as recommended by your health care provider. What happens during an annual well check? The services and screenings done by your health care provider during your annual well check will depend on your age, overall health, lifestyle risk factors, and family history of disease. Counseling  Your health care provider may ask you questions about your:  Alcohol use.  Tobacco use.  Drug use.  Emotional well-being.  Home and relationship  well-being.  Sexual activity.  Eating habits.  History of falls.  Memory and ability to understand (cognition).  Work and work Statistician.  Reproductive health. Screening  You may have the following tests or measurements:  Height, weight, and BMI.  Blood pressure.  Lipid and cholesterol levels. These may be checked every 5 years, or more frequently if you are over 84 years old.  Skin check.  Lung cancer screening. You may have this screening every year starting at age 84 if you have a 30-pack-year history of smoking and currently smoke or have quit within the past 15 years.  Fecal occult blood test (FOBT) of the stool. You may have this test every year starting at age 53.  Flexible sigmoidoscopy or colonoscopy. You may have a sigmoidoscopy every 5 years or a colonoscopy every 10 years starting at age 70.  Hepatitis C blood test.  Hepatitis B blood test.  Sexually transmitted disease (STD) testing.  Diabetes screening. This is done by checking your blood sugar (glucose) after you have not eaten for a while (fasting). You may have this done every 1-3 years.  Bone density scan. This is done to screen for osteoporosis. You may have this done starting at age 56.  Mammogram. This may be done every 1-2 years. Talk to your health care provider about how often you should have regular mammograms. Talk with your health care provider about your test results, treatment options, and if necessary, the need for more tests. Vaccines  Your health care provider may recommend certain vaccines, such as:  Influenza vaccine. This is recommended every year.  Tetanus, diphtheria, and acellular pertussis (Tdap, Td) vaccine. You  may need a Td booster every 10 years.  Zoster vaccine. You may need this after age 64.  Pneumococcal 13-valent conjugate (PCV13) vaccine. One dose is recommended after age 9.  Pneumococcal polysaccharide (PPSV23) vaccine. One dose is recommended after age  27. Talk to your health care provider about which screenings and vaccines you need and how often you need them. This information is not intended to replace advice given to you by your health care provider. Make sure you discuss any questions you have with your health care provider. Document Released: 06/08/2015 Document Revised: 01/30/2016 Document Reviewed: 03/13/2015 Elsevier Interactive Patient Education  2017 [place] Prevention in the Home Falls can cause injuries. They can happen to people of all ages. There are many things you can do to make your home safe and to help prevent falls. What can I do on the outside of my home?  Regularly fix the edges of walkways and driveways and fix any cracks.  Remove anything that might make you trip as you walk through a door, such as a raised step or threshold.  Trim any bushes or trees on the path to your home.  Use bright outdoor lighting.  Clear any walking paths of anything that might make someone trip, such as rocks or tools.  Regularly check to see if handrails are loose or broken. Make sure that both sides of any steps have handrails.  Any raised decks and porches should have guardrails on the edges.  Have any leaves, snow, or ice cleared regularly.  Use sand or salt on walking paths during winter.  Clean up any spills in your garage right away. This includes oil or grease spills. What can I do in the bathroom?  Use night lights.  Install grab bars by the toilet and in the tub and shower. Do not use towel bars as grab bars.  Use non-skid mats or decals in the tub or shower.  If you need to sit down in the shower, use a plastic, non-slip stool.  Keep the floor dry. Clean up any water that spills on the floor as soon as it happens.  Remove soap buildup in the tub or shower regularly.  Attach bath mats securely with double-sided non-slip rug tape.  Do not have throw rugs and other things on the floor that can make  you trip. What can I do in the bedroom?  Use night lights.  Make sure that you have a light by your bed that is easy to reach.  Do not use any sheets or blankets that are too big for your bed. They should not hang down onto the floor.  Have a firm chair that has side arms. You can use this for support while you get dressed.  Do not have throw rugs and other things on the floor that can make you trip. What can I do in the kitchen?  Clean up any spills right away.  Avoid walking on wet floors.  Keep items that you use a lot in easy-to-reach places.  If you need to reach something above you, use a strong step stool that has a grab bar.  Keep electrical cords out of the way.  Do not use floor polish or wax that makes floors slippery. If you must use wax, use non-skid floor wax.  Do not have throw rugs and other things on the floor that can make you trip. What can I do with my stairs?  Do not leave any items  on the stairs.  Make sure that there are handrails on both sides of the stairs and use them. Fix handrails that are broken or loose. Make sure that handrails are as long as the stairways.  Check any carpeting to make sure that it is firmly attached to the stairs. Fix any carpet that is loose or worn.  Avoid having throw rugs at the top or bottom of the stairs. If you do have throw rugs, attach them to the floor with carpet tape.  Make sure that you have a light switch at the top of the stairs and the bottom of the stairs. If you do not have them, ask someone to add them for you. What else can I do to help prevent falls?  Wear shoes that:  Do not have high heels.  Have rubber bottoms.  Are comfortable and fit you well.  Are closed at the toe. Do not wear sandals.  If you use a stepladder:  Make sure that it is fully opened. Do not climb a closed stepladder.  Make sure that both sides of the stepladder are locked into place.  Ask someone to hold it for you, if  possible.  Clearly mark and make sure that you can see:  Any grab bars or handrails.  First and last steps.  Where the edge of each step is.  Use tools that help you move around (mobility aids) if they are needed. These include:  Canes.  Walkers.  Scooters.  Crutches.  Turn on the lights when you go into a dark area. Replace any light bulbs as soon as they burn out.  Set up your furniture so you have a clear path. Avoid moving your furniture around.  If any of your floors are uneven, fix them.  If there are any pets around you, be aware of where they are.  Review your medicines with your doctor. Some medicines can make you feel dizzy. This can increase your chance of falling. Ask your doctor what other things that you can do to help prevent falls. This information is not intended to replace advice given to you by your health care provider. Make sure you discuss any questions you have with your health care provider. Document Released: 03/08/2009 Document Revised: 10/18/2015 Document Reviewed: 06/16/2014 Elsevier Interactive Patient Education  2017 [employer].   Annual physical exam in 4 months

## 2018-04-28 NOTE — Progress Notes (Signed)
Subjective:   [name] is a 67 y.o. female who presents for a welcome to medicare exam Review of Systems:   Cardiac Risk Factors include: smoking/ tobacco exposure;obesity (BMI >30kg/m2);dyslipidemia;hypertension;advanced age (>46men, >65 women)     Objective:     Vitals: BP 132/86   Pulse 90   Resp 12   Ht 5\' 7"  (1.702 m)   Wt 203 lb (92.1 kg)   SpO2 95%   BMI 31.79 kg/m   Body mass index is 31.79 kg/m.  Advanced Directives 04/28/2018 06/08/2016  Does Patient Have a Medical Advance Directive? No No  Would patient like information on creating a medical advance directive? Yes (ED - Information included in AVS) -    Tobacco Social History   Tobacco Use  Smoking Status Former Smoker  . Types: Cigarettes  Smokeless Tobacco Never Used     Counseling given: Not Answered  N/A Clinical Intake:  Pre-visit preparation completed: Yes  Pain : No/denies pain Pain Score: 0-No pain     BMI - recorded: 31.8 Nutritional Status: BMI > 30  Obese Nutritional Risks: None Diabetes: No  How often do you need to have someone help you when you read instructions, pamphlets, or other written materials from your doctor or pharmacy?: 1 - Never What is the last grade level you completed in school?: 12+  Interpreter Needed?: No  Information entered by :: Francena Hanly LPN   Past Medical History:  Diagnosis Date  . Arthritis, rheumatoid (HCC)    hx  . Collagen vascular disease (HCC)    RA  . Hyperlipidemia   . Hypertension   . Obesity   . 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 APPENDECTOMY  1983  . LITHOTRIPSY  2000   rt renal stone    Family History  Problem Relation Age of Onset  . Arthritis Mother   . Hypertension Mother   . Colitis Sister        allergies   . Migraines Sister   . Hypertension Sister   . Allergies Brother   . Asthma Brother    Social History   Socioeconomic History  . Marital status: Single   Spouse name: Not on file  . Number of children: 0  . Years of education: 12+  . Highest education level: Doctorate  Occupational History  . Occupation: Printmaker at [employer]  . Financial resource strain: Not hard at all  . Food insecurity:    Worry: Never true    Inability: Never true  . Transportation needs:    Medical: No    Non-medical: No  Tobacco Use  . Smoking status: Former Smoker    Types: Cigarettes  . Smokeless tobacco: Never Used  Substance and Sexual Activity  . Alcohol use: No    Frequency: Never  . Drug use: No  . Sexual activity: Not Currently  Lifestyle  . Physical activity:    Days per week: 0 days    Minutes per session: 0 min  . Stress: Only a little  Relationships  . Social connections:    Talks on phone: More than three times a week    Gets together: More than three times a week    Attends religious service: More than 4 times per year    Active member of club or organization: Yes    Attends meetings of clubs or organizations: More than 4 times per  year    Relationship status: Never married  Other Topics Concern  . Not on file  Social History Narrative   Lives alone     Outpatient Encounter Medications as of 04/28/2018  Medication Sig  . acetaminophen (TYLENOL) 500 MG tablet Take 1,000 mg by mouth every 6 (six) hours as needed.  Marland Kitchen albuterol (PROVENTIL HFA;VENTOLIN HFA) 108 (90 Base) MCG/ACT inhaler Inhale 2 puffs into the lungs every 6 (six) hours as needed for wheezing or shortness of breath.  . ALPRAZolam (XANAX) 0.25 MG tablet Take one tablet at bedtime as needed for anxiety  And sleep  . aspirin (ASPIRIN LOW DOSE) 81 MG EC tablet Take 81 mg by mouth daily.    . Calcium Carbonate-Vitamin D (CALTRATE 600+D) 600-400 MG-UNIT per tablet Take 1 tablet by mouth 2 (two) times daily.    . Cholecalciferol (VITAMIN D3) 5000 units TABS Take 1 tablet by mouth daily.  . cyclopentolate (CYCLODRYL,CYCLOGYL) 1 % ophthalmic solution Place 1  drop into the right eye as needed.  . fluticasone (FLONASE) 50 MCG/ACT nasal spray Place 2 sprays into both nostrils daily. (Patient taking differently: Place 2 sprays into both nostrils daily as needed for allergies. )  . hydroxychloroquine (PLAQUENIL) 200 MG tablet Take 400 mg by mouth daily.   Marland Kitchen ketorolac (ACULAR) 0.5 % ophthalmic solution Place 1 drop into both eyes 4 (four) times daily as needed.  . loratadine (CLARITIN) 10 MG tablet Take 10 mg by mouth daily.  Marland Kitchen losartan (COZAAR) 50 MG tablet Take 1 tablet (50 mg total) by mouth daily.  . montelukast (SINGULAIR) 10 MG tablet Take 1 tablet (10 mg total) by mouth at bedtime.  . Multiple Vitamins-Minerals (CENTRUM SILVER PO) Take by mouth daily.    . Omega-3 Fatty Acids (FISH OIL) 1000 MG CAPS Take 1,000 capsules by mouth daily.   Marland Kitchen omeprazole (PRILOSEC) 20 MG capsule Take 20 mg by mouth daily.  . pravastatin (PRAVACHOL) 10 MG tablet TAKE 1 TABLET DAILY STOP   TAKING CRESTOR  . prednisoLONE acetate (PRED FORTE) 1 % ophthalmic suspension Place 1 drop into the left eye daily as needed (for RA in eye).   . vitamin B-12 (CYANOCOBALAMIN) 50 MCG tablet Take 50 mcg by mouth daily.  Marland Kitchen alendronate (FOSAMAX) 70 MG tablet Take 1 tablet (70 mg total) by mouth every 7 (seven) days. Take with a full glass of water on an empty stomach. (Patient not taking: Reported on 04/28/2018)  . azithromycin (ZITHROMAX) 250 MG tablet Take one tablet on day one, then one tablet once daily for 4 additional days (Patient not taking: Reported on 04/28/2018)  . fluconazole (DIFLUCAN) 150 MG tablet Take one tablet one time onll (Patient not taking: Reported on 04/28/2018)  . hydrOXYzine (ATARAX/VISTARIL) 50 MG tablet One tablet at bedtime , as needed, for insomnia (Patient not taking: Reported on 04/28/2018)  . traMADol (ULTRAM) 50 MG tablet Take one tablet two times daily, as needed, for uncontrolled joint  pain (Patient not taking: Reported on 04/28/2018)  . Zinc 50 MG TABS Take 50  mg by mouth daily.   No facility-administered encounter medications on file as of 04/28/2018.     Activities of Daily Living In your present state of health, do you have any difficulty performing the following activities: 04/28/2018  Hearing? N  Vision? N  Difficulty concentrating or making decisions? N  Walking or climbing stairs? N  Dressing or bathing? N  Doing errands, shopping? N  Preparing Food and eating ? N  Using the Toilet? N  In the past six months, have you accidently leaked urine? N  Do you have problems with loss of bowel control? N  Managing your Medications? N  Managing your Finances? N  Housekeeping or managing your Housekeeping? N  Some recent data might be hidden    Patient Care Team: Fayrene Helper, MD as PCP - General Gavin Pound, MD as Consulting Physician (Rheumatology)    Assessment:   This is a welcome to medicare wellness exam for [facility].  Exercise Activities and Dietary recommendations Current Exercise Habits: Home exercise routine, Type of exercise: stretching, Time (Minutes): 15, Frequency (Times/Week): 3, Weekly Exercise (Minutes/Week): 45, Intensity: Mild, Exercise limited by: None identified  Goals    . Increase physical activity    . Patient Stated     Stop smoking again        Fall Risk Fall Risk  04/28/2018 03/04/2018 12/21/2017 04/12/2015  Falls in the past year? 0 No No No   Is the patient's home free of loose throw rugs in walkways, pet beds, electrical cords, etc?   no      Grab bars in the bathroom? no      Handrails on the stairs?   yes      Adequate lighting?   yes  Timed Get Up and Go performed: Patient able to perform in 5 seconds without assistance   Depression Screen PHQ 2/9 Scores 04/28/2018 03/04/2018 12/21/2017 05/14/2017  PHQ - 2 Score 0 0 2 0  PHQ- 9 Score - - 4 -     Cognitive Function     6CIT Screen 04/28/2018  What Year? 0 points  What month? 0 points  What time? 0 points  Count back from 20  0 points  Months in reverse 0 points  Repeat phrase 0 points  Total Score 0    Immunization History  Administered Date(s) Administered  . Influenza Whole 06/16/2006, 05/17/2009  . Influenza,inj,Quad PF,6+ Mos 02/12/2018  . Pneumococcal Conjugate-13 12/21/2017  . Td 06/16/2006  . Tdap 03/24/2014  . Zoster 09/28/2014    Qualifies for Shingles Vaccine?up to date   Screening Tests Health Maintenance  Topic Date Due  . COLONOSCOPY  10/11/2014  . PNA vac Low Risk Adult (2 of 2 - PPSV23) 12/22/2018  . MAMMOGRAM  03/31/2020  . PAP SMEAR  05/14/2020  . TETANUS/TDAP  03/24/2024  . INFLUENZA VACCINE  Completed  . DEXA SCAN  Completed  . Hepatitis C Screening  Completed  . HIV Screening  Completed    Cancer Screenings: Lung: Low Dose CT Chest recommended if Age 73-80 years, 30 pack-year currently smoking OR have quit w/in 15years. Patient does not qualify. Breast:  Up to date on Mammogram? Yes   Up to date of Bone Density/Dexa? Yes Colorectal: needs to schedule   Additional Screenings:  Hepatitis C Screening: complete   EKG: NSR, no LVH, no ischemia CCUA: negative for protein, glucose  And infection    Plan:   Plan to  quit smoking again, and increase physical activity   I have personally reviewed and noted the following in the patient's chart:   . Medical and social history . Use of alcohol, tobacco or illicit drugs  . Current medications and supplements . Functional ability and status . Nutritional status . Physical activity . Advanced directives . List of other physicians . Hospitalizations, surgeries, and ER visits in previous 12 months . Vitals . Screenings to include cognitive, depression, and falls .  Referrals and appointments  In addition, I have reviewed and discussed with patient certain preventive protocols, quality metrics, and best practice recommendations. A written personalized care plan for preventive services as well as general preventive health  recommendations were provided to patient.   Welcome to Medicare preventive visit Annual exam as documented. Counseling done  re healthy lifestyle involving commitment to 150 minutes exercise per week, heart healthy diet, and attaining healthy weight.The importance of adequate sleep also discussed. Regular seat belt use and home safety, is also discussed. Changes in health habits are decided on by the patient with goals and time frames  set for achieving them. Immunization and cancer screening needs are specifically addressed at this visit.     Hayden Pedro, LPN  11/0/2111

## 2018-04-29 DIAGNOSIS — M150 Primary generalized (osteo)arthritis: Secondary | ICD-10-CM | POA: Diagnosis not present

## 2018-04-29 DIAGNOSIS — Z79899 Other long term (current) drug therapy: Secondary | ICD-10-CM | POA: Diagnosis not present

## 2018-04-29 DIAGNOSIS — H209 Unspecified iridocyclitis: Secondary | ICD-10-CM | POA: Diagnosis not present

## 2018-04-29 DIAGNOSIS — M2550 Pain in unspecified joint: Secondary | ICD-10-CM | POA: Diagnosis not present

## 2018-04-29 DIAGNOSIS — M0579 Rheumatoid arthritis with rheumatoid factor of multiple sites without organ or systems involvement: Secondary | ICD-10-CM | POA: Diagnosis not present

## 2018-05-01 ENCOUNTER — Encounter: Payer: Self-pay | Admitting: Family Medicine

## 2018-05-01 NOTE — Assessment & Plan Note (Signed)

## 2018-05-10 ENCOUNTER — Encounter: Payer: Self-pay | Admitting: Family Medicine

## 2018-05-10 DIAGNOSIS — I10 Essential (primary) hypertension: Secondary | ICD-10-CM | POA: Diagnosis not present

## 2018-05-10 DIAGNOSIS — E785 Hyperlipidemia, unspecified: Secondary | ICD-10-CM | POA: Diagnosis not present

## 2018-05-10 DIAGNOSIS — E559 Vitamin D deficiency, unspecified: Secondary | ICD-10-CM | POA: Diagnosis not present

## 2018-05-11 ENCOUNTER — Encounter: Payer: Self-pay | Admitting: Family Medicine

## 2018-05-11 LAB — COMPLETE METABOLIC PANEL WITH GFR
AG Ratio: 1.3 (calc) (ref 1.0–2.5)
ALBUMIN MSPROF: 3.7 g/dL (ref 3.6–5.1)
ALKALINE PHOSPHATASE (APISO): 67 U/L (ref 33–130)
ALT: 13 U/L (ref 6–29)
AST: 16 U/L (ref 10–35)
BILIRUBIN TOTAL: 0.4 mg/dL (ref 0.2–1.2)
BUN: 13 mg/dL (ref 7–25)
CHLORIDE: 106 mmol/L (ref 98–110)
CO2: 27 mmol/L (ref 20–32)
Calcium: 9.1 mg/dL (ref 8.6–10.4)
Creat: 0.75 mg/dL (ref 0.50–0.99)
GFR, Est African American: 97 mL/min/{1.73_m2} (ref >=60)
GFR, Est Non African American: 84 mL/min/{1.73_m2} (ref >=60)
Globulin: 2.8 g/dL (calc) (ref 1.9–3.7)
Glucose, Bld: 85 mg/dL (ref 65–99)
POTASSIUM: 4.2 mmol/L (ref 3.5–5.3)
Sodium: 140 mmol/L (ref 135–146)
Total Protein: 6.5 g/dL (ref 6.1–8.1)

## 2018-05-11 LAB — LIPID PANEL
CHOLESTEROL: 174 mg/dL (ref <200)
HDL: 52 mg/dL (ref >50)
LDL CHOLESTEROL (CALC): 103 mg/dL — AB
Non-HDL Cholesterol (Calc): 122 mg/dL (calc) (ref <130)
TRIGLYCERIDES: 94 mg/dL (ref <150)
Total CHOL/HDL Ratio: 3.3 (calc) (ref <5.0)

## 2018-05-11 LAB — CBC WITH DIFFERENTIAL/PLATELET
Absolute Monocytes: 530 cells/uL (ref 200–950)
BASOS ABS: 61 {cells}/uL (ref 0–200)
Basophils Relative: 0.9 %
EOS PCT: 4.7 %
Eosinophils Absolute: 320 cells/uL (ref 15–500)
HEMATOCRIT: 38.8 % (ref 35.0–45.0)
Hemoglobin: 12.7 g/dL (ref 11.7–15.5)
LYMPHS ABS: 2067 {cells}/uL (ref 850–3900)
MCH: 27.4 pg (ref 27.0–33.0)
MCHC: 32.7 g/dL (ref 32.0–36.0)
MCV: 83.8 fL (ref 80.0–100.0)
MPV: 11.8 fL (ref 7.5–12.5)
Monocytes Relative: 7.8 %
Neutro Abs: 3822 cells/uL (ref 1500–7800)
Neutrophils Relative %: 56.2 %
Platelets: 242 10*3/uL (ref 140–400)
RBC: 4.63 10*6/uL (ref 3.80–5.10)
RDW: 14.2 % (ref 11.0–15.0)
Total Lymphocyte: 30.4 %
WBC: 6.8 10*3/uL (ref 3.8–10.8)

## 2018-05-11 LAB — TSH: TSH: 0.9 mIU/L (ref 0.40–4.50)

## 2018-05-11 LAB — VITAMIN D 25 HYDROXY (VIT D DEFICIENCY, FRACTURES): VIT D 25 HYDROXY: 50 ng/mL (ref 30–100)

## 2018-07-10 ENCOUNTER — Encounter: Payer: Self-pay | Admitting: Family Medicine

## 2018-07-12 ENCOUNTER — Other Ambulatory Visit: Payer: Self-pay

## 2018-07-12 MED ORDER — LOSARTAN POTASSIUM 50 MG PO TABS: 50.0000 mg | ORAL_TABLET | Freq: Every day | ORAL | 1 refills | Status: DC

## 2018-08-11 ENCOUNTER — Encounter: Payer: Self-pay | Admitting: Family Medicine

## 2018-08-11 DIAGNOSIS — M069 Rheumatoid arthritis, unspecified: Secondary | ICD-10-CM | POA: Diagnosis not present

## 2018-08-11 DIAGNOSIS — H35372 Puckering of macula, left eye: Secondary | ICD-10-CM | POA: Diagnosis not present

## 2018-08-11 DIAGNOSIS — Z79899 Other long term (current) drug therapy: Secondary | ICD-10-CM | POA: Diagnosis not present

## 2018-09-15 ENCOUNTER — Telehealth: Payer: Self-pay | Admitting: *Deleted

## 2018-09-15 ENCOUNTER — Other Ambulatory Visit: Payer: Self-pay

## 2018-09-15 DIAGNOSIS — J3089 Other allergic rhinitis: Secondary | ICD-10-CM

## 2018-09-15 MED ORDER — MONTELUKAST SODIUM 10 MG PO TABS: 10.0000 mg | ORAL_TABLET | Freq: Every day | ORAL | 1 refills | Status: DC

## 2018-09-15 MED ORDER — LOSARTAN POTASSIUM 50 MG PO TABS: 50.0000 mg | ORAL_TABLET | Freq: Every day | ORAL | 1 refills | Status: DC

## 2018-09-15 NOTE — Telephone Encounter (Signed)
Called pt to change CPE she stated she would need refills on bp medication and cingulair sent to [facility] in [place] since she would not be coming in until June

## 2018-09-15 NOTE — Telephone Encounter (Signed)
Medications refilled

## 2018-09-16 ENCOUNTER — Encounter: Payer: Medicare Other | Admitting: Family Medicine

## 2018-09-28 ENCOUNTER — Encounter: Payer: Self-pay | Admitting: *Deleted

## 2018-11-02 DIAGNOSIS — Z79899 Other long term (current) drug therapy: Secondary | ICD-10-CM | POA: Diagnosis not present

## 2018-11-02 DIAGNOSIS — M150 Primary generalized (osteo)arthritis: Secondary | ICD-10-CM | POA: Diagnosis not present

## 2018-11-02 DIAGNOSIS — M0579 Rheumatoid arthritis with rheumatoid factor of multiple sites without organ or systems involvement: Secondary | ICD-10-CM | POA: Diagnosis not present

## 2018-11-02 DIAGNOSIS — M2550 Pain in unspecified joint: Secondary | ICD-10-CM | POA: Diagnosis not present

## 2018-11-02 DIAGNOSIS — H209 Unspecified iridocyclitis: Secondary | ICD-10-CM | POA: Diagnosis not present

## 2018-11-15 ENCOUNTER — Encounter: Payer: Self-pay | Admitting: Family Medicine

## 2018-11-16 ENCOUNTER — Telehealth: Payer: Self-pay

## 2018-11-16 DIAGNOSIS — R7303 Prediabetes: Secondary | ICD-10-CM

## 2018-11-16 DIAGNOSIS — E785 Hyperlipidemia, unspecified: Secondary | ICD-10-CM

## 2018-11-16 DIAGNOSIS — I10 Essential (primary) hypertension: Secondary | ICD-10-CM

## 2018-11-16 NOTE — Telephone Encounter (Signed)
Labs ordered per MD 

## 2018-11-17 DIAGNOSIS — I10 Essential (primary) hypertension: Secondary | ICD-10-CM | POA: Diagnosis not present

## 2018-11-17 DIAGNOSIS — R7303 Prediabetes: Secondary | ICD-10-CM | POA: Diagnosis not present

## 2018-11-17 DIAGNOSIS — M0579 Rheumatoid arthritis with rheumatoid factor of multiple sites without organ or systems involvement: Secondary | ICD-10-CM | POA: Diagnosis not present

## 2018-11-17 DIAGNOSIS — E785 Hyperlipidemia, unspecified: Secondary | ICD-10-CM | POA: Diagnosis not present

## 2018-11-17 DIAGNOSIS — Z79899 Other long term (current) drug therapy: Secondary | ICD-10-CM | POA: Diagnosis not present

## 2018-11-18 ENCOUNTER — Encounter: Payer: Self-pay | Admitting: Family Medicine

## 2018-11-18 LAB — LIPID PANEL
Cholesterol: 196 mg/dL (ref <200)
HDL: 52 mg/dL (ref >=50)
LDL Cholesterol (Calc): 122 mg/dL (calc) — ABNORMAL HIGH
Non-HDL Cholesterol (Calc): 144 mg/dL (calc) — ABNORMAL HIGH (ref <130)
Total CHOL/HDL Ratio: 3.8 (calc) (ref <5.0)
Triglycerides: 108 mg/dL (ref <150)

## 2018-11-18 LAB — COMPLETE METABOLIC PANEL WITH GFR
AG Ratio: 1.3 (calc) (ref 1.0–2.5)
ALT: 12 U/L (ref 6–29)
AST: 17 U/L (ref 10–35)
Albumin: 3.7 g/dL (ref 3.6–5.1)
Alkaline phosphatase (APISO): 63 U/L (ref 37–153)
BUN: 14 mg/dL (ref 7–25)
CO2: 29 mmol/L (ref 20–32)
Calcium: 9.1 mg/dL (ref 8.6–10.4)
Chloride: 104 mmol/L (ref 98–110)
Creat: 0.85 mg/dL (ref 0.50–0.99)
GFR, Est African American: 83 mL/min/{1.73_m2} (ref >=60)
GFR, Est Non African American: 72 mL/min/{1.73_m2} (ref >=60)
Globulin: 2.9 g/dL (calc) (ref 1.9–3.7)
Glucose, Bld: 92 mg/dL (ref 65–99)
Potassium: 4.3 mmol/L (ref 3.5–5.3)
Sodium: 140 mmol/L (ref 135–146)
Total Bilirubin: 0.3 mg/dL (ref 0.2–1.2)
Total Protein: 6.6 g/dL (ref 6.1–8.1)

## 2018-11-18 LAB — HEMOGLOBIN A1C
Hgb A1c MFr Bld: 5.4 % of total Hgb (ref <5.7)
Mean Plasma Glucose: 108 (calc)
eAG (mmol/L): 6 (calc)

## 2018-11-18 LAB — MAGNESIUM: Magnesium: 1.9 mg/dL (ref 1.5–2.5)

## 2018-11-23 ENCOUNTER — Encounter: Payer: Self-pay | Admitting: Family Medicine

## 2018-11-23 ENCOUNTER — Ambulatory Visit (HOSPITAL_COMMUNITY)
Admission: RE | Admit: 2018-11-23 | Discharge: 2018-11-23 | Disposition: A | Discharge status: Home / Self Care | Payer: Medicare Other | Source: Ambulatory Visit | Attending: Family Medicine | Admitting: Family Medicine

## 2018-11-23 ENCOUNTER — Ambulatory Visit (INDEPENDENT_AMBULATORY_CARE_PROVIDER_SITE_OTHER): Payer: Medicare Other | Admitting: Family Medicine

## 2018-11-23 ENCOUNTER — Other Ambulatory Visit: Payer: Self-pay

## 2018-11-23 VITALS — BP 122/74 | HR 95 | Temp 98.6°F | Resp 15 | Ht 67.0 in | Wt 201.0 lb

## 2018-11-23 DIAGNOSIS — Z0000 Encounter for general adult medical examination without abnormal findings: Secondary | ICD-10-CM | POA: Diagnosis not present

## 2018-11-23 DIAGNOSIS — Z1231 Encounter for screening mammogram for malignant neoplasm of breast: Secondary | ICD-10-CM

## 2018-11-23 DIAGNOSIS — M5442 Lumbago with sciatica, left side: Secondary | ICD-10-CM | POA: Diagnosis not present

## 2018-11-23 DIAGNOSIS — M545 Low back pain: Secondary | ICD-10-CM | POA: Diagnosis not present

## 2018-11-23 MED ORDER — GABAPENTIN 100 MG PO CAPS: 100.0000 mg | ORAL_CAPSULE | Freq: Every day | ORAL | 3 refills | Status: DC

## 2018-11-23 MED ORDER — PREDNISONE 5 MG (21) PO TBPK: 5.0000 mg | ORAL_TABLET | ORAL | 0 refills | Status: DC

## 2018-11-23 NOTE — Progress Notes (Signed)
    [name]     MRN: [id number]      DOB: 07-Apr-1951  HPI: Patient is in for annual physical exam. Left  leg cramping pain and discomfort , worse in the toes for the past several weeks, 2 to 3 weeks. Walking barefoot is uncomfortanble , foot goes to sleep  Recent labs, are reviewed. Immunization is reviewed    PE: BP 122/74   Pulse 95   Temp 98.6 F (37 C) (Temporal)   Resp 15   Ht 5\' 7"  (1.702 m)   Wt 201 lb (91.2 kg)   SpO2 98%   BMI 31.48 kg/m   Pleasant  female, alert and oriented x 3, in no cardio-pulmonary distress. Afebrile. HEENT No facial trauma or asymetry. Sinuses non tender.  Extra occullar muscles intact External ears normal, tympanic membranes clear. Oropharynx moist, no exudate. Neck: supple  Chest: Clear to ascultation bilaterally.No crackles or wheezes. Non tender to palpation  Breast:No concerns, not examined Upcoming mammogram in November  Cardiovascular system; Heart sounds normal,  S1 and  S2 ,no S3.  No murmur, or thrill. Apical beat not displaced Peripheral pulses normal.    GU: No exam indicated    Musculoskeletal exam: Adequate though reduced  ROM of spine, hips , shoulders and knees. No deformity ,swelling or crepitus noted. No muscle wasting or atrophy.   Neurologic: Cranial nerves 2 to 12 intact. Power, tone ,sensation and reflexes normal throughout. No disturbance in gait. No tremor.  Skin: Intact, no ulceration, erythema , scaling or rash noted. Pigmentation normal throughout  Psych; Normal mood and affect. Judgement and concentration normal   Assessment & Plan:  Annual physical exam Annual exam as documented. Counseling done  re healthy lifestyle involving commitment to 150 minutes exercise per week, heart healthy diet, and attaining healthy weight.The importance of adequate sleep also discussed.  Changes in health habits are decided on by the patient with goals and time frames  set for achieving them.  Cancer sceening needs are specifically addressed   Low back pain with left-sided sciatica Left lower extremity symptoms likely due to arthritis in spine and possible disc disease, X ray to be obtained  Acute left-sided back pain with sciatica Left lower ext pain likely due to arhtirits in spine and disc disease needs X ray

## 2018-11-23 NOTE — Patient Instructions (Addendum)
F/u in early January, call if youi need me sooner  Please schedule mammogram due in November  Pls schedule appointment with dr Laural Golden for your colonoscopy  New left leg and foot discomfort and tingling I believe is from arthritis in your low back. Pls get X ray ordered today, and I am prescribing short course of prednisone and also bed time gabapentin, low dose, both are at your local pharmacy  Labs are excellent except for cholesterol, please resume your cholesterol medication and also lower fat intake    Labs due for your January visit end December fasting lipid, cmp and EGFR

## 2018-11-24 ENCOUNTER — Encounter: Payer: Self-pay | Admitting: Family Medicine

## 2018-11-27 ENCOUNTER — Encounter: Payer: Self-pay | Admitting: Family Medicine

## 2018-11-27 DIAGNOSIS — M5442 Lumbago with sciatica, left side: Secondary | ICD-10-CM

## 2018-11-27 HISTORY — DX: Lumbago with sciatica, left side: M54.42

## 2018-11-27 NOTE — Assessment & Plan Note (Addendum)
Annual exam as documented. Counseling done  re healthy lifestyle involving commitment to 150 minutes exercise per week, heart healthy diet, and attaining healthy weight.The importance of adequate sleep also discussed.  Changes in health habits are decided on by the patient with goals and time frames  set for achieving them. Cancer sceening needs are specifically addressed

## 2018-11-27 NOTE — Assessment & Plan Note (Signed)
Left lower extremity symptoms likely due to arthritis in spine and possible disc disease, X ray to be obtained

## 2018-11-27 NOTE — Assessment & Plan Note (Signed)
Left lower ext pain likely due to arhtirits in spine and disc disease needs X ray

## 2019-02-09 DIAGNOSIS — M722 Plantar fascial fibromatosis: Secondary | ICD-10-CM | POA: Diagnosis not present

## 2019-02-09 DIAGNOSIS — M79672 Pain in left foot: Secondary | ICD-10-CM | POA: Diagnosis not present

## 2019-02-09 DIAGNOSIS — M79671 Pain in right foot: Secondary | ICD-10-CM | POA: Diagnosis not present

## 2019-03-07 ENCOUNTER — Ambulatory Visit (INDEPENDENT_AMBULATORY_CARE_PROVIDER_SITE_OTHER): Payer: Medicare Other

## 2019-03-07 ENCOUNTER — Other Ambulatory Visit: Payer: Self-pay

## 2019-03-07 DIAGNOSIS — Z23 Encounter for immunization: Secondary | ICD-10-CM

## 2019-03-08 ENCOUNTER — Other Ambulatory Visit: Payer: Self-pay | Admitting: *Deleted

## 2019-03-08 DIAGNOSIS — Z20822 Contact with and (suspected) exposure to covid-19: Secondary | ICD-10-CM

## 2019-03-10 LAB — NOVEL CORONAVIRUS, NAA: SARS-CoV-2, NAA: NOT DETECTED

## 2019-04-05 ENCOUNTER — Other Ambulatory Visit: Payer: Self-pay | Admitting: Family Medicine

## 2019-04-05 ENCOUNTER — Encounter: Payer: Self-pay | Admitting: Family Medicine

## 2019-04-05 MED ORDER — MONTELUKAST SODIUM 10 MG PO TABS: 10.0000 mg | ORAL_TABLET | Freq: Every day | ORAL | 3 refills | Status: DC

## 2019-04-19 ENCOUNTER — Telehealth (INDEPENDENT_AMBULATORY_CARE_PROVIDER_SITE_OTHER): Payer: Medicare Other

## 2019-04-19 DIAGNOSIS — Z1211 Encounter for screening for malignant neoplasm of colon: Secondary | ICD-10-CM

## 2019-04-19 LAB — HEMOCCULT GUIAC POC 1CARD (OFFICE)
Card #1 Date: 11182020
Card #2 Date: 11192020
Card #2 Fecal Occult Blod, POC: NEGATIVE
Card #3 Date: 11202020
Card #3 Fecal Occult Blood, POC: NEGATIVE
Fecal Occult Blood, POC: NEGATIVE

## 2019-04-19 NOTE — Telephone Encounter (Signed)
FOB results entered

## 2019-04-25 ENCOUNTER — Encounter: Payer: Self-pay | Admitting: Family Medicine

## 2019-04-25 ENCOUNTER — Other Ambulatory Visit: Payer: Self-pay

## 2019-04-25 DIAGNOSIS — E785 Hyperlipidemia, unspecified: Secondary | ICD-10-CM

## 2019-04-25 MED ORDER — ALENDRONATE SODIUM 70 MG PO TABS: 70.0000 mg | ORAL_TABLET | ORAL | 0 refills | Status: DC

## 2019-04-25 MED ORDER — PRAVASTATIN SODIUM 10 MG PO TABS: ORAL_TABLET | ORAL | 1 refills | Status: DC

## 2019-05-02 ENCOUNTER — Other Ambulatory Visit: Payer: Self-pay

## 2019-05-02 ENCOUNTER — Ambulatory Visit: Payer: Medicare Other

## 2019-05-02 ENCOUNTER — Encounter: Payer: Self-pay | Admitting: Family Medicine

## 2019-05-02 ENCOUNTER — Ambulatory Visit (INDEPENDENT_AMBULATORY_CARE_PROVIDER_SITE_OTHER): Payer: Medicare Other | Admitting: Family Medicine

## 2019-05-02 VITALS — BP 122/74 | HR 95 | Resp 15 | Ht 67.0 in | Wt 201.0 lb

## 2019-05-02 DIAGNOSIS — Z0000 Encounter for general adult medical examination without abnormal findings: Secondary | ICD-10-CM

## 2019-05-02 NOTE — Patient Instructions (Addendum)
[name] , Thank you for taking time to come for your Medicare Wellness Visit. I appreciate your ongoing commitment to your health goals. Please review the following plan we discussed and let me know if I can assist you in the future.   Please continue to practice social distancing to keep you, your family, and our community safe.  If you must go out, please wear a Mask and practice good handwashing.  We hope you have a safe, happy, and healthy Holiday Season! See you in the New Year :)  Screening recommendations/referrals: Colonoscopy: postponed to 2021 Mammogram: up to date Bone Density: up to date  Recommended yearly ophthalmology/optometry visit for glaucoma screening and checkup Recommended yearly dental visit for hygiene and checkup  Vaccinations: Influenza vaccine: up to date  pneumococcal vaccine: needs next dose  Tdap vaccine: up to date  Shingles vaccine: completed  Advanced directives: Please bring a copy of this when you get the new one filled out.   Conditions/risks identified: Falls   Next appointment: 05/31/2019; 1 year for AWV    Preventive Care 24 Years and Older, Female Preventive care refers to lifestyle choices and visits with your health care provider that can promote health and wellness. What does preventive care include?  A yearly physical exam. This is also called an annual well check.  Dental exams once or twice a year.  Routine eye exams. Ask your health care provider how often you should have your eyes checked.  Personal lifestyle choices, including:  Daily care of your teeth and gums.  Regular physical activity.  Eating a healthy diet.  Avoiding tobacco and drug use.  Limiting alcohol use.  Practicing safe sex.  Taking low-dose aspirin every day.  Taking vitamin and mineral supplements as recommended by your health care provider. What happens during an annual well check? The services and screenings done by your health care provider  during your annual well check will depend on your age, overall health, lifestyle risk factors, and family history of disease. Counseling  Your health care provider may ask you questions about your:  Alcohol use.  Tobacco use.  Drug use.  Emotional well-being.  Home and relationship well-being.  Sexual activity.  Eating habits.  History of falls.  Memory and ability to understand (cognition).  Work and work Statistician.  Reproductive health. Screening  You may have the following tests or measurements:  Height, weight, and BMI.  Blood pressure.  Lipid and cholesterol levels. These may be checked every 5 years, or more frequently if you are over 36 years old.  Skin check.  Lung cancer screening. You may have this screening every year starting at age 60 if you have a 30-pack-year history of smoking and currently smoke or have quit within the past 15 years.  Fecal occult blood test (FOBT) of the stool. You may have this test every year starting at age 63.  Flexible sigmoidoscopy or colonoscopy. You may have a sigmoidoscopy every 5 years or a colonoscopy every 10 years starting at age 67.  Hepatitis C blood test.  Hepatitis B blood test.  Sexually transmitted disease (STD) testing.  Diabetes screening. This is done by checking your blood sugar (glucose) after you have not eaten for a while (fasting). You may have this done every 1-3 years.  Bone density scan. This is done to screen for osteoporosis. You may have this done starting at age 42.  Mammogram. This may be done every 1-2 years. Talk to your health care provider  about how often you should have regular mammograms. Talk with your health care provider about your test results, treatment options, and if necessary, the need for more tests. Vaccines  Your health care provider may recommend certain vaccines, such as:  Influenza vaccine. This is recommended every year.  Tetanus, diphtheria, and acellular pertussis  (Tdap, Td) vaccine. You may need a Td booster every 10 years.  Zoster vaccine. You may need this after age 65.  Pneumococcal 13-valent conjugate (PCV13) vaccine. One dose is recommended after age 34.  Pneumococcal polysaccharide (PPSV23) vaccine. One dose is recommended after age 9. Talk to your health care provider about which screenings and vaccines you need and how often you need them. This information is not intended to replace advice given to you by your health care provider. Make sure you discuss any questions you have with your health care provider. Document Released: 06/08/2015 Document Revised: 01/30/2016 Document Reviewed: 03/13/2015 Elsevier Interactive Patient Education  2017 [employer] Prevention in the Home Falls can cause injuries. They can happen to people of all ages. There are many things you can do to make your home safe and to help prevent falls. What can I do on the outside of my home?  Regularly fix the edges of walkways and driveways and fix any cracks.  Remove anything that might make you trip as you walk through a door, such as a raised step or threshold.  Trim any bushes or trees on the path to your home.  Use bright outdoor lighting.  Clear any walking paths of anything that might make someone trip, such as rocks or tools.  Regularly check to see if handrails are loose or broken. Make sure that both sides of any steps have handrails.  Any raised decks and porches should have guardrails on the edges.  Have any leaves, snow, or ice cleared regularly.  Use sand or salt on walking paths during winter.  Clean up any spills in your garage right away. This includes oil or grease spills. What can I do in the bathroom?  Use night lights.  Install grab bars by the toilet and in the tub and shower. Do not use towel bars as grab bars.  Use non-skid mats or decals in the tub or shower.  If you need to sit down in the shower, use a plastic, non-slip  stool.  Keep the floor dry. Clean up any water that spills on the floor as soon as it happens.  Remove soap buildup in the tub or shower regularly.  Attach bath mats securely with double-sided non-slip rug tape.  Do not have throw rugs and other things on the floor that can make you trip. What can I do in the bedroom?  Use night lights.  Make sure that you have a light by your bed that is easy to reach.  Do not use any sheets or blankets that are too big for your bed. They should not hang down onto the floor.  Have a firm chair that has side arms. You can use this for support while you get dressed.  Do not have throw rugs and other things on the floor that can make you trip. What can I do in the kitchen?  Clean up any spills right away.  Avoid walking on wet floors.  Keep items that you use a lot in easy-to-reach places.  If you need to reach something above you, use a strong step stool that has a grab bar.  Keep electrical cords out of the way.  Do not use floor polish or wax that makes floors slippery. If you must use wax, use non-skid floor wax.  Do not have throw rugs and other things on the floor that can make you trip. What can I do with my stairs?  Do not leave any items on the stairs.  Make sure that there are handrails on both sides of the stairs and use them. Fix handrails that are broken or loose. Make sure that handrails are as long as the stairways.  Check any carpeting to make sure that it is firmly attached to the stairs. Fix any carpet that is loose or worn.  Avoid having throw rugs at the top or bottom of the stairs. If you do have throw rugs, attach them to the floor with carpet tape.  Make sure that you have a light switch at the top of the stairs and the bottom of the stairs. If you do not have them, ask someone to add them for you. What else can I do to help prevent falls?  Wear shoes that:  Do not have high heels.  Have rubber bottoms.  Are  comfortable and fit you well.  Are closed at the toe. Do not wear sandals.  If you use a stepladder:  Make sure that it is fully opened. Do not climb a closed stepladder.  Make sure that both sides of the stepladder are locked into place.  Ask someone to hold it for you, if possible.  Clearly mark and make sure that you can see:  Any grab bars or handrails.  First and last steps.  Where the edge of each step is.  Use tools that help you move around (mobility aids) if they are needed. These include:  Canes.  Walkers.  Scooters.  Crutches.  Turn on the lights when you go into a dark area. Replace any light bulbs as soon as they burn out.  Set up your furniture so you have a clear path. Avoid moving your furniture around.  If any of your floors are uneven, fix them.  If there are any pets around you, be aware of where they are.  Review your medicines with your doctor. Some medicines can make you feel dizzy. This can increase your chance of falling. Ask your doctor what other things that you can do to help prevent falls. This information is not intended to replace advice given to you by your health care provider. Make sure you discuss any questions you have with your health care provider. Document Released: 03/08/2009 Document Revised: 10/18/2015 Document Reviewed: 06/16/2014 Elsevier Interactive Patient Education  2017 [employer].

## 2019-05-02 NOTE — Progress Notes (Signed)
Subjective:   [name] is a 68 y.o. female who presents for Medicare Annual (Subsequent) preventive examination.  Location of Patient: Home Location of Provider: Telehealth Consent was obtain for visit to be over via telehealth.  I verified that I am speaking with the correct person using two identifiers.   Review of Systems:    Cardiac Risk Factors include: advanced age (>58men, >49 women);dyslipidemia;hypertension;obesity (BMI >30kg/m2)     Objective:     Vitals: BP 122/74   Pulse 95   Resp 15   Ht 5\' 7"  (1.702 m)   Wt 201 lb (91.2 kg)   BMI 31.48 kg/m   Body mass index is 31.48 kg/m.  Advanced Directives 04/28/2018 06/08/2016  Does Patient Have a Medical Advance Directive? No No  Would patient like information on creating a medical advance directive? Yes (ED - Information included in AVS) -    Tobacco Social History   Tobacco Use  Smoking Status Former Smoker  . Types: Cigarettes  Smokeless Tobacco Never Used     Counseling given: Yes   Clinical Intake:  Pre-visit preparation completed: Yes  Pain : No/denies pain Pain Score: 0-No pain     BMI - recorded: 31.48 Nutritional Status: BMI > 30  Obese Nutritional Risks: None Diabetes: No  How often do you need to have someone help you when you read instructions, pamphlets, or other written materials from your doctor or pharmacy?: 1 - Never What is the last grade level you completed in school?: doctorate  Interpreter Needed?: No     Past Medical History:  Diagnosis Date  . Arthritis, rheumatoid ([place]) 1993   hx  . Collagen vascular disease ([facility])    RA  . Hyperlipidemia   . Hypertension   . Kidney stone 1999  . Obesity   . Osteoporosis 2019   intolerant of fosamax due to jaw pain  . 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 APPENDECTOMY  1983  . LITHOTRIPSY  2000   rt renal stone    Family History  Problem Relation Age of Onset  . Arthritis  Mother   . Hypertension Mother   . Colitis Sister        allergies   . Migraines Sister   . Hypertension Sister   . Allergies Brother   . Asthma Brother    Social History   Socioeconomic History  . Marital status: Single    Spouse name: Not on file  . Number of children: 0  . Years of education: 12+  . Highest education level: Doctorate  Occupational History  . Occupation: Printmaker at [employer]  . Financial resource strain: Not hard at all  . Food insecurity    Worry: Never true    Inability: Never true  . Transportation needs    Medical: No    Non-medical: No  Tobacco Use  . Smoking status: Former Smoker    Types: Cigarettes  . Smokeless tobacco: Never Used  Substance and Sexual Activity  . Alcohol use: No    Frequency: Never  . Drug use: No  . Sexual activity: Not Currently  Lifestyle  . Physical activity    Days per week: 0 days    Minutes per session: 0 min  . Stress: Only a little  Relationships  . Social connections    Talks on phone: More than three times a week    Gets  together: More than three times a week    Attends religious service: More than 4 times per year    Active member of club or organization: Yes    Attends meetings of clubs or organizations: More than 4 times per year    Relationship status: Never married  Other Topics Concern  . Not on file  Social History Narrative   Lives alone     Outpatient Encounter Medications as of 05/02/2019  Medication Sig  . acetaminophen (TYLENOL) 500 MG tablet Take 1,000 mg by mouth every 6 (six) hours as needed.  Marland Kitchen albuterol (PROVENTIL HFA;VENTOLIN HFA) 108 (90 Base) MCG/ACT inhaler Inhale 2 puffs into the lungs every 6 (six) hours as needed for wheezing or shortness of breath.  Marland Kitchen alendronate (FOSAMAX) 70 MG tablet Take 1 tablet (70 mg total) by mouth once a week. Take with a full glass of water on an empty stomach.  . ALPRAZolam (XANAX) 0.25 MG tablet Take one tablet at bedtime as  needed for anxiety  And sleep  . aspirin (ASPIRIN LOW DOSE) 81 MG EC tablet Take 81 mg by mouth daily.    . Calcium Carbonate-Vitamin D (CALTRATE 600+D) 600-400 MG-UNIT per tablet Take 1 tablet by mouth 2 (two) times daily.    . Cholecalciferol (VITAMIN D3) 5000 units TABS Take 1 tablet by mouth daily.  . fluticasone (FLONASE) 50 MCG/ACT nasal spray Place 2 sprays into both nostrils daily. (Patient taking differently: Place 2 sprays into both nostrils daily as needed for allergies. )  . gabapentin (NEURONTIN) 100 MG capsule Take 1 capsule (100 mg total) by mouth at bedtime.  . hydroxychloroquine (PLAQUENIL) 200 MG tablet Take 400 mg by mouth daily.   . hydrOXYzine (ATARAX/VISTARIL) 50 MG tablet One tablet at bedtime, as needed,  for sleep and anxiety  . loratadine (CLARITIN) 10 MG tablet Take 10 mg by mouth daily.  Marland Kitchen losartan (COZAAR) 50 MG tablet Take 1 tablet (50 mg total) by mouth daily.  . montelukast (SINGULAIR) 10 MG tablet Take 1 tablet (10 mg total) by mouth at bedtime.  . Multiple Vitamins-Minerals (CENTRUM SILVER PO) Take by mouth daily.    . Omega-3 Fatty Acids (FISH OIL) 1000 MG CAPS Take 1,000 capsules by mouth daily.   Marland Kitchen omeprazole (PRILOSEC) 20 MG capsule Take 20 mg by mouth daily.  . pravastatin (PRAVACHOL) 10 MG tablet TAKE 1 TABLET DAILY STOP   TAKING CRESTOR  . predniSONE (STERAPRED UNI-PAK 21 TAB) 5 MG (21) TBPK tablet Take 1 tablet (5 mg total) by mouth as directed. Use as directed  . vitamin B-12 (CYANOCOBALAMIN) 50 MCG tablet Take 50 mcg by mouth daily.  . Zinc 50 MG TABS Take 50 mg by mouth daily.   No facility-administered encounter medications on file as of 05/02/2019.     Activities of Daily Living In your present state of health, do you have any difficulty performing the following activities: 05/02/2019  Hearing? N  Vision? N  Difficulty concentrating or making decisions? N  Walking or climbing stairs? N  Dressing or bathing? N  Doing errands, shopping? N   Preparing Food and eating ? N  Using the Toilet? N  In the past six months, have you accidently leaked urine? N  Do you have problems with loss of bowel control? N  Managing your Medications? N  Managing your Finances? N  Housekeeping or managing your Housekeeping? N  Some recent data might be hidden    Patient Care Team: Fayrene Helper, MD  as PCP - Nelia Shi, MD as Consulting Physician (Rheumatology)    Assessment:   This is a routine wellness examination for [facility].  Exercise Activities and Dietary recommendations Current Exercise Habits: The patient does not participate in regular exercise at present, Exercise limited by: None identified  Goals    . Increase physical activity    . Patient Stated     Stop smoking again        Fall Risk Fall Risk  05/02/2019 11/23/2018 04/28/2018 03/04/2018 12/21/2017  Falls in the past year? 0 0 0 No No  Number falls in past yr: 0 0 - - -  Injury with Fall? 0 0 - - -   Is the patient's home free of loose throw rugs in walkways, pet beds, electrical cords, etc?   yes      Grab bars in the bathroom? yes      Handrails on the stairs?   yes      Adequate lighting?   yes     Depression Screen PHQ 2/9 Scores 05/02/2019 11/23/2018 04/28/2018 03/04/2018  PHQ - 2 Score 0 0 0 0  PHQ- 9 Score - - - -     Cognitive Function     6CIT Screen 05/02/2019 04/28/2018  What Year? 0 points 0 points  What month? 0 points 0 points  What time? 0 points 0 points  Count back from 20 0 points 0 points  Months in reverse 0 points 0 points  Repeat phrase 0 points 0 points  Total Score 0 0    Immunization History  Administered Date(s) Administered  . Fluad Quad(high Dose 65+) 03/07/2019  . Influenza Whole 06/16/2006, 05/17/2009  . Influenza,inj,Quad PF,6+ Mos 02/12/2018  . Pneumococcal Conjugate-13 12/21/2017  . Td 06/16/2006  . Tdap 03/24/2014  . Zoster 09/28/2014    Qualifies for Shingles Vaccine? completed  Screening Tests  Health Maintenance  Topic Date Due  . COLONOSCOPY  10/11/2014  . PNA vac Low Risk Adult (2 of 2 - PPSV23) 12/22/2018  . MAMMOGRAM  03/31/2020  . TETANUS/TDAP  03/24/2024  . INFLUENZA VACCINE  Completed  . DEXA SCAN  Completed  . Hepatitis C Screening  Completed    Cancer Screenings: Lung: Low Dose CT Chest recommended if Age 72-80 years, 30 pack-year currently smoking OR have quit w/in 15years. Patient does not qualify. Breast:  Up to date on Mammogram? Yes   Up to date of Bone Density/Dexa? Yes Colorectal:  postponed  Additional Screenings:  : Hepatitis C Screening: completed     Plan:      1. Encounter for Medicare annual wellness exam   I have personally reviewed and noted the following in the patient's chart:   . Medical and social history . Use of alcohol, tobacco or illicit drugs  . Current medications and supplements . Functional ability and status . Nutritional status . Physical activity . Advanced directives . List of other physicians . Hospitalizations, surgeries, and ER visits in previous 12 months . Vitals . Screenings to include cognitive, depression, and falls . Referrals and appointments  In addition, I have reviewed and discussed with patient certain preventive protocols, quality metrics, and best practice recommendations. A written personalized care plan for preventive services as well as general preventive health recommendations were provided to patient.     I provided 20 minutes of non-face-to-face time during this encounter.    Perlie Mayo, NP  05/02/2019

## 2019-05-03 ENCOUNTER — Ambulatory Visit: Payer: Medicare Other | Admitting: Family Medicine

## 2019-05-12 ENCOUNTER — Ambulatory Visit (HOSPITAL_COMMUNITY)
Admission: RE | Admit: 2019-05-12 | Discharge: 2019-05-12 | Disposition: A | Discharge status: Home / Self Care | Payer: Medicare Other | Source: Ambulatory Visit | Attending: Family Medicine | Admitting: Family Medicine

## 2019-05-12 ENCOUNTER — Other Ambulatory Visit: Payer: Self-pay

## 2019-05-12 ENCOUNTER — Other Ambulatory Visit (HOSPITAL_COMMUNITY): Payer: Self-pay | Admitting: Family Medicine

## 2019-05-12 DIAGNOSIS — R928 Other abnormal and inconclusive findings on diagnostic imaging of breast: Secondary | ICD-10-CM

## 2019-05-12 DIAGNOSIS — Z1231 Encounter for screening mammogram for malignant neoplasm of breast: Secondary | ICD-10-CM | POA: Diagnosis not present

## 2019-05-13 DIAGNOSIS — M0579 Rheumatoid arthritis with rheumatoid factor of multiple sites without organ or systems involvement: Secondary | ICD-10-CM | POA: Diagnosis not present

## 2019-05-13 DIAGNOSIS — Z79899 Other long term (current) drug therapy: Secondary | ICD-10-CM | POA: Diagnosis not present

## 2019-05-16 DIAGNOSIS — M150 Primary generalized (osteo)arthritis: Secondary | ICD-10-CM | POA: Diagnosis not present

## 2019-05-16 DIAGNOSIS — Z79899 Other long term (current) drug therapy: Secondary | ICD-10-CM | POA: Diagnosis not present

## 2019-05-16 DIAGNOSIS — M2550 Pain in unspecified joint: Secondary | ICD-10-CM | POA: Diagnosis not present

## 2019-05-16 DIAGNOSIS — M0579 Rheumatoid arthritis with rheumatoid factor of multiple sites without organ or systems involvement: Secondary | ICD-10-CM | POA: Diagnosis not present

## 2019-05-16 DIAGNOSIS — H209 Unspecified iridocyclitis: Secondary | ICD-10-CM | POA: Diagnosis not present

## 2019-05-24 ENCOUNTER — Other Ambulatory Visit: Payer: Self-pay

## 2019-05-24 ENCOUNTER — Ambulatory Visit (HOSPITAL_COMMUNITY)
Admission: RE | Admit: 2019-05-24 | Discharge: 2019-05-24 | Disposition: A | Discharge status: Home / Self Care | Payer: Medicare Other | Source: Ambulatory Visit | Attending: Family Medicine | Admitting: Family Medicine

## 2019-05-24 ENCOUNTER — Other Ambulatory Visit (HOSPITAL_COMMUNITY): Payer: Self-pay | Admitting: Family Medicine

## 2019-05-24 ENCOUNTER — Encounter: Payer: Self-pay | Admitting: Family Medicine

## 2019-05-24 DIAGNOSIS — R928 Other abnormal and inconclusive findings on diagnostic imaging of breast: Secondary | ICD-10-CM | POA: Insufficient documentation

## 2019-05-24 DIAGNOSIS — N6489 Other specified disorders of breast: Secondary | ICD-10-CM | POA: Diagnosis not present

## 2019-05-24 DIAGNOSIS — N6322 Unspecified lump in the left breast, upper inner quadrant: Secondary | ICD-10-CM | POA: Diagnosis not present

## 2019-05-31 ENCOUNTER — Other Ambulatory Visit: Payer: Self-pay

## 2019-05-31 ENCOUNTER — Ambulatory Visit: Payer: Medicare Other | Admitting: Family Medicine

## 2019-05-31 ENCOUNTER — Ambulatory Visit (HOSPITAL_COMMUNITY)
Admission: RE | Admit: 2019-05-31 | Discharge: 2019-05-31 | Disposition: A | Discharge status: Home / Self Care | Payer: Medicare Other | Source: Ambulatory Visit | Attending: Family Medicine | Admitting: Family Medicine

## 2019-05-31 ENCOUNTER — Other Ambulatory Visit (HOSPITAL_COMMUNITY): Payer: Self-pay | Admitting: Family Medicine

## 2019-05-31 DIAGNOSIS — R928 Other abnormal and inconclusive findings on diagnostic imaging of breast: Secondary | ICD-10-CM | POA: Diagnosis present

## 2019-05-31 DIAGNOSIS — C8589 Other specified types of non-Hodgkin lymphoma, extranodal and solid organ sites: Secondary | ICD-10-CM | POA: Diagnosis not present

## 2019-05-31 DIAGNOSIS — C884 Extranodal marginal zone B-cell lymphoma of mucosa-associated lymphoid tissue [MALT-lymphoma]: Secondary | ICD-10-CM | POA: Insufficient documentation

## 2019-05-31 DIAGNOSIS — N6322 Unspecified lump in the left breast, upper inner quadrant: Secondary | ICD-10-CM | POA: Diagnosis not present

## 2019-05-31 DIAGNOSIS — N6489 Other specified disorders of breast: Secondary | ICD-10-CM | POA: Diagnosis not present

## 2019-05-31 DIAGNOSIS — C8510 Unspecified B-cell lymphoma, unspecified site: Secondary | ICD-10-CM | POA: Diagnosis not present

## 2019-05-31 MED ORDER — LIDOCAINE-EPINEPHRINE (PF) 1 %-1:200000 IJ SOLN
INTRAMUSCULAR | Status: AC
  Filled 2019-05-31: qty 30

## 2019-05-31 MED ORDER — LIDOCAINE HCL (PF) 1 % IJ SOLN
INTRAMUSCULAR | Status: AC
  Filled 2019-05-31: qty 5

## 2019-05-31 MED ORDER — SODIUM BICARBONATE 4.2 % IV SOLN
INTRAVENOUS | Status: AC
  Filled 2019-05-31: qty 10

## 2019-06-02 LAB — SURGICAL PATHOLOGY

## 2019-06-03 ENCOUNTER — Encounter: Payer: Self-pay | Admitting: Family Medicine

## 2019-06-04 ENCOUNTER — Other Ambulatory Visit: Payer: Self-pay | Admitting: Family Medicine

## 2019-06-04 ENCOUNTER — Telehealth: Payer: Self-pay | Admitting: Family Medicine

## 2019-06-04 DIAGNOSIS — C884 Extranodal marginal zone B-cell lymphoma of mucosa-associated lymphoid tissue [MALT-lymphoma]: Secondary | ICD-10-CM

## 2019-06-04 MED ORDER — ALPRAZOLAM 0.25 MG PO TABS: 0.2500 mg | ORAL_TABLET | Freq: Every evening | ORAL | 3 refills | Status: DC | PRN

## 2019-06-04 NOTE — Telephone Encounter (Signed)
Call made to patient to review recent breast biopsy result of lymphoma. I will refer urgently to Oncology at [facility] on Monday, so that her care can begin I am prescibing bedtime xanax as needed, she is aware

## 2019-06-06 ENCOUNTER — Encounter (HOSPITAL_COMMUNITY): Payer: Self-pay

## 2019-06-07 ENCOUNTER — Inpatient Hospital Stay (HOSPITAL_COMMUNITY): Payer: Medicare Other

## 2019-06-07 ENCOUNTER — Other Ambulatory Visit: Payer: Self-pay

## 2019-06-07 ENCOUNTER — Inpatient Hospital Stay (HOSPITAL_COMMUNITY): Payer: Medicare Other | Attending: Hematology | Admitting: Hematology

## 2019-06-07 ENCOUNTER — Encounter (HOSPITAL_COMMUNITY): Payer: Self-pay | Admitting: Hematology

## 2019-06-07 ENCOUNTER — Encounter (HOSPITAL_COMMUNITY): Payer: Self-pay | Admitting: Lab

## 2019-06-07 ENCOUNTER — Ambulatory Visit (INDEPENDENT_AMBULATORY_CARE_PROVIDER_SITE_OTHER): Payer: Medicare Other

## 2019-06-07 VITALS — BP 141/83 | HR 106 | Temp 97.5°F | Resp 20 | Ht 67.0 in | Wt 201.1 lb

## 2019-06-07 DIAGNOSIS — M810 Age-related osteoporosis without current pathological fracture: Secondary | ICD-10-CM | POA: Diagnosis not present

## 2019-06-07 DIAGNOSIS — E785 Hyperlipidemia, unspecified: Secondary | ICD-10-CM | POA: Diagnosis not present

## 2019-06-07 DIAGNOSIS — Z23 Encounter for immunization: Secondary | ICD-10-CM

## 2019-06-07 DIAGNOSIS — F17210 Nicotine dependence, cigarettes, uncomplicated: Secondary | ICD-10-CM | POA: Diagnosis not present

## 2019-06-07 DIAGNOSIS — C884 Extranodal marginal zone b-cell lymphoma of mucosa-associated lymphoid tissue (malt-lymphoma) not having achieved remission: Secondary | ICD-10-CM

## 2019-06-07 DIAGNOSIS — I10 Essential (primary) hypertension: Secondary | ICD-10-CM | POA: Diagnosis not present

## 2019-06-07 DIAGNOSIS — M069 Rheumatoid arthritis, unspecified: Secondary | ICD-10-CM | POA: Insufficient documentation

## 2019-06-07 LAB — CBC WITH DIFFERENTIAL/PLATELET
Abs Immature Granulocytes: 0.01 10*3/uL (ref 0.00–0.07)
Basophils Absolute: 0 10*3/uL (ref 0.0–0.1)
Basophils Relative: 0 %
Eosinophils Absolute: 0.1 10*3/uL (ref 0.0–0.5)
Eosinophils Relative: 1 %
HCT: 44.3 % (ref 36.0–46.0)
Hemoglobin: 14 g/dL (ref 12.0–15.0)
Immature Granulocytes: 0 %
Lymphocytes Relative: 18 %
Lymphs Abs: 1.7 10*3/uL (ref 0.7–4.0)
MCH: 27.6 pg (ref 26.0–34.0)
MCHC: 31.6 g/dL (ref 30.0–36.0)
MCV: 87.2 fL (ref 80.0–100.0)
Monocytes Absolute: 0.6 10*3/uL (ref 0.1–1.0)
Monocytes Relative: 6 %
Neutro Abs: 7.4 10*3/uL (ref 1.7–7.7)
Neutrophils Relative %: 75 %
Platelets: 264 10*3/uL (ref 150–400)
RBC: 5.08 MIL/uL (ref 3.87–5.11)
RDW: 14.9 % (ref 11.5–15.5)
WBC: 9.8 10*3/uL (ref 4.0–10.5)
nRBC: 0 % (ref 0.0–0.2)

## 2019-06-07 LAB — COMPREHENSIVE METABOLIC PANEL
ALT: 20 U/L (ref 0–44)
AST: 21 U/L (ref 15–41)
Albumin: 4 g/dL (ref 3.5–5.0)
Alkaline Phosphatase: 61 U/L (ref 38–126)
Anion gap: 8 (ref 5–15)
BUN: 17 mg/dL (ref 8–23)
CO2: 26 mmol/L (ref 22–32)
Calcium: 9.4 mg/dL (ref 8.9–10.3)
Chloride: 103 mmol/L (ref 98–111)
Creatinine, Ser: 0.79 mg/dL (ref 0.44–1.00)
GFR calc Af Amer: >60 mL/min (ref >60)
GFR calc non Af Amer: >60 mL/min (ref >60)
Glucose, Bld: 95 mg/dL (ref 70–99)
Potassium: 4.2 mmol/L (ref 3.5–5.1)
Sodium: 137 mmol/L (ref 135–145)
Total Bilirubin: 0.6 mg/dL (ref 0.3–1.2)
Total Protein: 8 g/dL (ref 6.5–8.1)

## 2019-06-07 LAB — LACTATE DEHYDROGENASE: LDH: 142 U/L (ref 98–192)

## 2019-06-07 LAB — HEPATITIS B SURFACE ANTIBODY,QUALITATIVE: Hep B S Ab: NONREACTIVE

## 2019-06-07 LAB — URIC ACID: Uric Acid, Serum: 4.9 mg/dL (ref 2.5–7.1)

## 2019-06-07 LAB — HEPATITIS C ANTIBODY: HCV Ab: NONREACTIVE

## 2019-06-07 LAB — HEPATITIS B SURFACE ANTIGEN: Hepatitis B Surface Ag: NONREACTIVE

## 2019-06-07 NOTE — Progress Notes (Signed)
pneumo 23 given in left deltoid without complications

## 2019-06-07 NOTE — Assessment & Plan Note (Addendum)
1. MALT Lymphoma  -Presented for routine mammogram on May 12, 2019.  Mammogram evaluation suggested a possible mass in the left breast.  -Diagnostic mammogram and ultrasound was then performed on 05/24/2019 which showed mixed echogenicity and markedly irregular mass measuring approximately 3.1 x 1.9 x 1.0 cm.  Left axilla ultrasound was negative for any lymphadenopathy.   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   -Discussed findings, diagnosis, and prognosis of disease with patient.  At this time, we will need to complete a staging work-up.  Staging work-up will consist of PET CT scan.  If patient is found to have stage II or higher would recommend bone marrow biopsy.  We will also need to complete lab work-up consisting of a CBC, CMP, LDH, SPEP,  uric acid, and Hep B and C.  Pending on stage she may require radiation and/or chemotherapy. -We will see her back once PET scan is complete.  2.  Rheumatoid arthritis -Diagnosed in her 35s.  She is currently on Plaquenil daily.  Addendum: -I have independently elicited history and examined this patient and talked to her about the diagnosis in detail.  I agree with the above documentation by my nurse practitioner.  She was found to have incidental left breast mass on mammogram.  Patient denied having any fevers, night sweats or weight loss.  She has rheumatoid arthritis for 20+ years.  She reported being on Plaquenil for last 2 years.  I have discussed with her about the pathology report in detail.  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  We will check an LDH and hepatitis serology.  We will also order whole-body PET CT scan for staging purposes.  I will see her back after the PET scan.  If there is no other areas on the PET scan,  bone marrow biopsy can be considered to further confirm stage I disease.  At that point she will be offered radiation to the breast.  As per the patient request, we will also send her for a second opinion with a lymphoma specialist at [facility].

## 2019-06-07 NOTE — Progress Notes (Signed)
[facility] Initial Visit:  Patient Care Team: Fayrene Helper, MD as PCP - General Gavin Pound, MD as Consulting Physician (Rheumatology)  CHIEF COMPLAINTS/PURPOSE OF CONSULTATION: - Newly diagnosed MALT Lymphoma    HISTORY OF PRESENTING ILLNESS: [name] 69 y.o. female presents today for consult regarding newly diagnosed MALT lymphoma.  Has a past medical history significant for prophyria, rheumatoid arthritis, hypertension, hyperlipidemia. She was diagnosed with rheumatoid arthritis in her 76s and  has been on and off of Plaquenil.  Presented for routine mammogram on May 12, 2019.  Mammogram evaluation suggested a possible mass in the left breast.  Diagnostic mammogram and ultrasound was then performed on 05/24/2019 which showed mixed echogenicity and markedly irregular mass measuring approximately 3.1 x 1.9 x 1.0 cm.  Left axilla ultrasound was negative for any lymphadenopathy.  Biopsy performed on May 30, 2018 with pathology consistent with non-Hodgkin lymphoma, specifically extranodal marginal zone lymphoma of mucosa associated lymphoid tissue.  No further work-up has been completed at this time.  Patient reports rheumatoid arthritis is well controlled with Plaquenil.  She denies any significant fatigue.  She denies any fevers, chills, night sweats.  Reports family history is negative for malignancy but has several autoimmune disorders.  Her mother had rheumatoid arthritis.  She has a sister who has ulcerative colitis.  She is a current every day smoker, smoking a half a pack of cigarettes a day.    Review of Systems  Constitutional: Positive for fatigue.  HENT:  Negative.   Eyes: Negative.   Respiratory: Negative.   Cardiovascular: Negative.   Gastrointestinal: Negative.   Endocrine: Negative.   Genitourinary: Negative.    Musculoskeletal: Positive for arthralgias.  Skin: Negative.   Neurological: Negative.   Hematological: Negative.    Psychiatric/Behavioral: Negative.     MEDICAL HISTORY: Past Medical History:  Diagnosis Date  . Arthritis, rheumatoid ([place]) 1993   hx  . Collagen vascular disease (HCC)    RA  . Hyperlipidemia   . Hypertension   . Kidney stone 1999  . Obesity   . Osteoporosis 2019   intolerant of fosamax due to jaw pain  . Porphyria ([place])    hx     SURGICAL HISTORY: Past Surgical History:  Procedure Laterality Date  . APPENDECTOMY  1983  . LITHOTRIPSY  2000   rt renal stone     SOCIAL HISTORY: Social History   Socioeconomic History  . Marital status: Single    Spouse name: Not on file  . Number of children: 0  . Years of education: 12+  . Highest education level: Doctorate  Occupational History  . Occupation: Retired; Teacher part time currently  Tobacco Use  . Smoking status: Current Every Day Smoker    Packs/day: 0.50    Types: Cigarettes  . Smokeless tobacco: Never Used  Substance and Sexual Activity  . Alcohol use: No  . Drug use: No  . Sexual activity: Not Currently  Other Topics Concern  . Not on file  Social History Narrative   Lives alone    Social Determinants of Health   Financial Resource Strain: Low Risk   . Difficulty of Paying Living Expenses: Not hard at all  Food Insecurity: No Food Insecurity  . Worried About Charity fundraiser in the Last Year: Never true  . Ran Out of Food in the Last Year: Never true  Transportation Needs: No Transportation Needs  . Lack of Transportation (Medical): No  . Lack of Transportation (  Non-Medical): No  Physical Activity: Insufficiently Active  . Days of Exercise per Week: 1 day  . Minutes of Exercise per Session: 60 min  Stress: Stress Concern Present  . Feeling of Stress : Very much  Social Connections: Slightly Isolated  . Frequency of Communication with Friends and Family: More than three times a week  . Frequency of Social Gatherings with Friends and Family: More than three times a week  . Attends Religious  Services: More than 4 times per year  . Active Member of Clubs or Organizations: Yes  . Attends Archivist Meetings: More than 4 times per year  . Marital Status: Never married  Intimate Partner Violence: Not At Risk  . Fear of Current or Ex-Partner: No  . Emotionally Abused: No  . Physically Abused: No  . Sexually Abused: No    FAMILY HISTORY Family History  Problem Relation Age of Onset  . Arthritis Mother   . Hypertension Mother   . Hypertension Sister   . Arthritis Sister   . Hypertension Sister   . Arthritis Sister   . Ulcerative colitis Sister   . Hypertension Sister   . Arthritis Sister   . Stroke Maternal Grandmother   . Arthritis Maternal Grandfather   . Arthritis Paternal Grandmother   . Stroke Paternal Grandfather     ALLERGIES:  is allergic to septra [sulfamethoxazole-trimethoprim]; barbiturates; cefuroxime axetil; chlordiazepoxide; hydrocodone-acetaminophen; phenytoin; and rofecoxib.  MEDICATIONS:  Current Outpatient Medications  Medication Sig Dispense Refill  . acetaminophen (TYLENOL) 500 MG tablet Take 1,000 mg by mouth every 6 (six) hours as needed.    Marland Kitchen albuterol (PROVENTIL HFA;VENTOLIN HFA) 108 (90 Base) MCG/ACT inhaler Inhale 2 puffs into the lungs every 6 (six) hours as needed for wheezing or shortness of breath. (Patient not taking: Reported on 06/06/2019) 1 Inhaler 0  . alendronate (FOSAMAX) 70 MG tablet Take 1 tablet (70 mg total) by mouth once a week. Take with a full glass of water on an empty stomach. 12 tablet 0  . ALPRAZolam (XANAX) 0.25 MG tablet Take 1 tablet (0.25 mg total) by mouth at bedtime as needed for anxiety. (Patient not taking: Reported on 06/06/2019) 30 tablet 3  . aspirin (ASPIRIN LOW DOSE) 81 MG EC tablet Take 81 mg by mouth daily.      . Calcium Carbonate-Vitamin D (CALTRATE 600+D) 600-400 MG-UNIT per tablet Take 1 tablet by mouth 2 (two) times daily.      . Cholecalciferol (VITAMIN D3) 5000 units TABS Take 1 tablet by mouth  daily.    Marland Kitchen co-enzyme Q-10 30 MG capsule Take 30 mg by mouth 3 (three) times daily.    . fluticasone (FLONASE) 50 MCG/ACT nasal spray Place 2 sprays into both nostrils daily. (Patient not taking: Reported on 06/06/2019) 48 g 1  . gabapentin (NEURONTIN) 100 MG capsule Take 1 capsule (100 mg total) by mouth at bedtime. (Patient not taking: Reported on 06/06/2019) 30 capsule 3  . hydroxychloroquine (PLAQUENIL) 200 MG tablet Take 400 mg by mouth daily.     . hydrOXYzine (ATARAX/VISTARIL) 50 MG tablet One tablet at bedtime, as needed,  for sleep and anxiety (Patient not taking: Reported on 06/06/2019) 30 tablet 5  . loratadine (CLARITIN) 10 MG tablet Take 10 mg by mouth daily.    Marland Kitchen losartan (COZAAR) 50 MG tablet Take 1 tablet (50 mg total) by mouth daily. 90 tablet 1  . montelukast (SINGULAIR) 10 MG tablet Take 1 tablet (10 mg total) by mouth at bedtime. [place]  tablet 3  . Multiple Vitamins-Minerals (CENTRUM SILVER PO) Take by mouth daily.      . Omega-3 Fatty Acids (FISH OIL) 1000 MG CAPS Take 1,000 capsules by mouth daily.     Marland Kitchen omeprazole (PRILOSEC) 20 MG capsule Take 20 mg by mouth daily.    . pravastatin (PRAVACHOL) 10 MG tablet TAKE 1 TABLET DAILY STOP   TAKING CRESTOR 90 tablet 1  . Turmeric (QC TUMERIC COMPLEX) 500 MG CAPS Take 1 capsule by mouth.    . vitamin B-12 (CYANOCOBALAMIN) 50 MCG tablet Take 50 mcg by mouth daily.    . Zinc 50 MG TABS Take 50 mg by mouth daily.     No current facility-administered medications for this visit.    PHYSICAL EXAMINATION:  ECOG PERFORMANCE STATUS: 0 - Asymptomatic   Vitals:   06/07/19 0836  BP: (!) 141/83  Pulse: (!) 106  Resp: 20  Temp: (!) 97.5 F (36.4 C)  SpO2: 100%    Filed Weights   06/07/19 0836  Weight: 201 lb 1.6 oz (91.2 kg)     Physical Exam Constitutional:      Appearance: Normal appearance.  HENT:     Head: Normocephalic.     Right Ear: External ear normal.     Left Ear: External ear normal.     Nose: Nose normal.      Mouth/Throat:     Pharynx: Oropharynx is clear.  Eyes:     Conjunctiva/sclera: Conjunctivae normal.  Cardiovascular:     Rate and Rhythm: Normal rate and regular rhythm.     Pulses: Normal pulses.     Heart sounds: Normal heart sounds.  Pulmonary:     Effort: Pulmonary effort is normal.     Breath sounds: Normal breath sounds.  Abdominal:     General: Bowel sounds are normal.  Musculoskeletal:     Cervical back: Normal range of motion.  Skin:    General: Skin is warm.  Neurological:     General: No focal deficit present.     Mental Status: She is alert and oriented to person, place, and time.  Psychiatric:        Mood and Affect: Mood normal.        Behavior: Behavior normal.      LABORATORY DATA: I have personally reviewed the data as listed:  Appointment on 06/07/2019  Component Date Value Ref Range Status  . WBC 06/07/2019 9.8  4.0 - 10.5 K/uL Final  . RBC 06/07/2019 5.08  3.87 - 5.11 MIL/uL Final  . Hemoglobin 06/07/2019 14.0  12.0 - 15.0 g/dL Final  . HCT 06/07/2019 44.3  36.0 - 46.0 % Final  . MCV 06/07/2019 87.2  80.0 - 100.0 fL Final  . MCH 06/07/2019 27.6  26.0 - 34.0 pg Final  . MCHC 06/07/2019 31.6  30.0 - 36.0 g/dL Final  . RDW 06/07/2019 14.9  11.5 - 15.5 % Final  . Platelets 06/07/2019 264  150 - 400 K/uL Final  . nRBC 06/07/2019 0.0  0.0 - 0.2 % Final  . Neutrophils Relative % 06/07/2019 75  % Final  . Neutro Abs 06/07/2019 7.4  1.7 - 7.7 K/uL Final  . Lymphocytes Relative 06/07/2019 18  % Final  . Lymphs Abs 06/07/2019 1.7  0.7 - 4.0 K/uL Final  . Monocytes Relative 06/07/2019 6  % Final  . Monocytes Absolute 06/07/2019 0.6  0.1 - 1.0 K/uL Final  . Eosinophils Relative 06/07/2019 1  % Final  . Eosinophils Absolute 06/07/2019  0.1  0.0 - 0.5 K/uL Final  . Basophils Relative 06/07/2019 0  % Final  . Basophils Absolute 06/07/2019 0.0  0.0 - 0.1 K/uL Final  . Immature Granulocytes 06/07/2019 0  % Final  . Abs Immature Granulocytes 06/07/2019 0.01  0.00  - 0.07 K/uL Final   Performed at [facility], [address] [place], [place] [address]  . Sodium 06/07/2019 137  135 - 145 mmol/L Final  . Potassium 06/07/2019 4.2  3.5 - 5.1 mmol/L Final  . Chloride 06/07/2019 103  98 - 111 mmol/L Final  . CO2 06/07/2019 26  22 - 32 mmol/L Final  . Glucose, Bld 06/07/2019 95  70 - 99 mg/dL Final  . BUN 06/07/2019 17  8 - 23 mg/dL Final  . Creatinine, Ser 06/07/2019 0.79  0.44 - 1.00 mg/dL Final  . Calcium 06/07/2019 9.4  8.9 - 10.3 mg/dL Final  . Total Protein 06/07/2019 8.0  6.5 - 8.1 g/dL Final  . Albumin 06/07/2019 4.0  3.5 - 5.0 g/dL Final  . AST 06/07/2019 21  15 - 41 U/L Final  . ALT 06/07/2019 20  0 - 44 U/L Final  . Alkaline Phosphatase 06/07/2019 61  38 - 126 U/L Final  . Total Bilirubin 06/07/2019 0.6  0.3 - 1.2 mg/dL Final  . GFR calc non Af Amer 06/07/2019 >60  >60 mL/min Final  . GFR calc Af Amer 06/07/2019 >60  >60 mL/min Final  . Anion gap 06/07/2019 8  5 - 15 Final   Performed at [facility], [address], [place], [place] [address]  . LDH 06/07/2019 142  98 - 192 U/L Final   Performed at [facility], [address], [place], [place] [address]  . Hepatitis B Surface Ag 06/07/2019 NON REACTIVE  NON REACTIVE Final   Performed at [facility], [place] [address] [place], [place] [address]  . Hep B S Ab 06/07/2019 NON REACTIVE  NON REACTIVE Final   Comment: (NOTE) Inconsistent with immunity, less than 10 mIU/mL. Performed at [facility], [place] [address] [place], [place] [address]   . Uric Acid, Serum 06/07/2019 4.9  2.5 - 7.1 mg/dL Final   Performed at [facility], [address], [place], [place] [address]  Office Visit on 06/07/2019  Component Date Value Ref Range Status  . HCV Ab 06/07/2019 NON REACTIVE  NON REACTIVE Final   Comment: (NOTE) Nonreactive HCV antibody screen is consistent with no HCV infections,  unless recent infection is suspected or other evidence exists to indicate HCV infection. Performed at  [facility], [place] [address] [place],  [address]   Hospital Outpatient Visit on 05/31/2019  Component Date Value Ref Range Status  . SURGICAL PATHOLOGY 05/31/2019    Final-Edited                   Value:SURGICAL PATHOLOGY CASE: [id number] PATIENT: [name] Surgical Pathology Report     Clinical History: irregular mass/dilated ducts, upper inner left breast 10:00    FINAL MICROSCOPIC DIAGNOSIS:  A. BREAST, UPPER LEFT 10:00, BIOPSY: -  Non-Hodgkin B-cell lymphoma -  See comment  COMMENT:  The biopsy consists of 3 core fragments of breast tissue with a diffuse lymphoplasmacytic infiltrate.  By immunohistochemistry (CD20, CD3, CD5, CD10, CD23, BCL2, BCL6, CD138 and kappa and lambda in situ by in situ hybridization), there is a predominance of B-cells and kappa restricted plasma cells.  Overall, the morphology and immunophenotype are consistent with Non-Hodgkin  lymphoma, specifically Extranodal marginal zone lymphoma of mucosa associated lymphoid tissue (MALT lymphoma) with plasmacytic differentiation.  Dr. Tresa Moore reviewed the case and agrees with the above diagnosis.  [name] was notified of these results on June 02, 2019.   GROSS DESCRIP                         TION:  The specimen is received in formalin labeled with the patient's name and left breast, and consists of 3 cores of tan-yellow, hemorrhagic fibroadipose tissue ranging from 0.6 x 0.2 x 0.1 cm to 1.5 x 0.2 x 0.2 cm.  The specimen is entirely submitted in 1 cassette. Time in formalin 8:45 AM on 05/31/2019.  Cold ischemic time less than 1 minute. Craig Staggers 06/02/2019)    Final Diagnosis performed by Thressa Sheller, MD.   Electronically signed 06/02/2019 Technical component performed at [facility] [address] [place], [place] [address].  Professional component performed at [employer]. [facility], [place] [address] [place], [place] [address].   Immunohistochemistry Technical component (if applicable) was performed at [facility]. [address] [place], [place],  [address].   IMMUNOHISTOCHEMISTRY DISCLAIMER (if applicable): Some of these immunohistochemical stains may have been developed and the performance characteristics d                         etermine by [facility]. Some may not have been cleared or approved by the U.S. Food and Drug Administration. The FDA has determined that such clearance or approval is not necessary. This test is used for clinical purposes. It should not be regarded as investigational or for research. This laboratory is certified under the [place] (CLIA-88) as qualified to perform high complexity clinical laboratory testing.  The controls stained appropriately.      ASSESSMENT/PLAN   MALT lymphoma ([place]) 1. MALT Lymphoma  -Presented for routine mammogram on May 12, 2019.  Mammogram evaluation suggested a possible mass in the left breast.  -Diagnostic mammogram and ultrasound was then performed on 05/24/2019 which showed mixed echogenicity and markedly irregular mass measuring approximately 3.1 x 1.9 x 1.0 cm.  Left axilla ultrasound was negative for any lymphadenopathy.   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   -Discussed findings, diagnosis, and prognosis of disease with patient.  At this time, we will need to complete a staging work-up.  Staging work-up will consist of PET CT scan.  If patient is found to have stage II or higher would recommend bone marrow biopsy.  We will also need to complete lab work-up consisting of a CBC, CMP, LDH, SPEP,  uric acid, and Hep B and C.  Pending on stage she may require radiation and/or chemotherapy. -We will see her back once PET scan is  complete.  2.  Rheumatoid arthritis -Diagnosed in her 60s.  She is currently on Plaquenil daily.  Addendum: -I have independently elicited history and examined this patient and talked to her about the diagnosis in detail.  I agree with the above documentation by my nurse practitioner.  She was found to have incidental left breast mass on mammogram.  Patient denied having any fevers, night sweats or weight loss.  She has rheumatoid arthritis for 20+ years.  She reported being on Plaquenil for last 2 years.  I have discussed with her about the pathology report in detail.  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  We will check an LDH and hepatitis serology.  We will also order whole-body PET CT scan for staging purposes.  I will see her back after the PET scan.  If there is no other areas on the PET scan, bone marrow biopsy can be considered to further confirm stage I disease.  At that point she will be offered radiation to the breast.  As per the patient request, we will also send her for a second opinion with a lymphoma specialist at [facility].   Orders Placed This Encounter  Procedures  . NM PET Image Initial (PI) Skull Base To Thigh    Standing Status:   Future    Standing Expiration Date:   06/06/2020    Order Specific Question:   ** REASON FOR EXAM (FREE TEXT)    Answer:   MALT Lymphoma    Order Specific Question:   If indicated for the ordered procedure, I authorize the administration of a radiopharmaceutical per Radiology protocol    Answer:   Yes    Order Specific Question:   Preferred imaging location?    Answer:   [facility]    Order Specific Question:   Radiology Contrast Protocol - do NOT remove file path    Answer:   [email or web address]  . CBC with Differential    Standing Status:   Future    Number of Occurrences:   1    Standing Expiration  Date:   06/06/2020  . Comprehensive metabolic panel    Standing Status:   Future    Number of Occurrences:   1    Standing Expiration Date:   06/06/2020  . Multiple Myeloma Panel (SPEP&IFE w/QIG)    Standing Status:   Future    Number of Occurrences:   1    Standing Expiration Date:   06/06/2020  . Lactate dehydrogenase    Standing Status:   Future    Number of Occurrences:   1    Standing Expiration Date:   06/06/2020  . Hepatitis B surface antigen    Standing Status:   Future    Number of Occurrences:   1    Standing Expiration Date:   06/06/2020  . Hepatitis B surface antibody    Standing Status:   Future    Number of Occurrences:   1    Standing Expiration Date:   06/06/2020  . Uric acid    Standing Status:   Future    Number of Occurrences:   1    Standing Expiration Date:   06/06/2020  . Hepatitis C Antibody    All questions were answered. The patient knows to call the clinic with any problems, questions or concerns.  This note was electronically signed.    Derek Jack  06/07/2019 5:56 PM

## 2019-06-07 NOTE — Progress Notes (Unsigned)
Referral sent to [facility] for 2nd opinion.  Records faxed on 06/07/2019

## 2019-06-07 NOTE — Patient Instructions (Signed)
[place] at [facility] Discharge Instructions  You were seen today by Dr. Delton Coombes. He went over your history, family history and how you've been feeling lately. He will have blood drawn today. He will schedule you for a  He will see you back after your scan for follow up.   Thank you for choosing [place] at [facility] to provide your oncology and hematology care.  To afford each patient quality time with our provider, please arrive at least 15 minutes before your scheduled appointment time.   If you have a lab appointment with the [facility] please come in thru the  Main Entrance and check in at the main information desk  You need to re-schedule your appointment should you arrive 10 or more minutes late.  We strive to give you quality time with our providers, and arriving late affects you and other patients whose appointments are after yours.  Also, if you no show three or more times for appointments you may be dismissed from the clinic at the providers discretion.     Again, thank you for choosing [facility].  Our hope is that these requests will decrease the amount of time that you wait before being seen by our physicians.       _____________________________________________________________  Should you have questions after your visit to [facility], please contact our office at [phone] between the hours of 8:00 a.m. and 4:30 p.m.  Voicemails left after 4:00 p.m. will not be returned until the following business day.  For prescription refill requests, have your pharmacy contact our office and allow 72 hours.    Cancer Center Support Programs:   > Cancer Support Group  2nd Tuesday of the month 1pm-2pm, Journey Room

## 2019-06-08 ENCOUNTER — Encounter: Payer: Self-pay | Admitting: Family Medicine

## 2019-06-09 ENCOUNTER — Telehealth: Payer: Self-pay | Admitting: *Deleted

## 2019-06-09 ENCOUNTER — Ambulatory Visit: Payer: Medicare Other | Admitting: Family Medicine

## 2019-06-09 NOTE — Telephone Encounter (Signed)
Spoke with pt let her know Dr Moshe Cipro was reaching out to her. She said she doesn't turn cell phone on until she wakes up. She said she would be home and would have her phone on and Dr Moshe Cipro could reach out at her convenience she would be there

## 2019-06-13 ENCOUNTER — Encounter: Payer: Self-pay | Admitting: Family Medicine

## 2019-06-13 ENCOUNTER — Encounter (HOSPITAL_COMMUNITY): Payer: Self-pay

## 2019-06-13 LAB — MULTIPLE MYELOMA PANEL, SERUM
Albumin SerPl Elph-Mcnc: 4 g/dL (ref 2.9–4.4)
Albumin/Glob SerPl: 1.1 (ref 0.7–1.7)
Alpha 1: 0.3 g/dL (ref 0.0–0.4)
Alpha2 Glob SerPl Elph-Mcnc: 0.7 g/dL (ref 0.4–1.0)
B-Globulin SerPl Elph-Mcnc: 1.6 g/dL — ABNORMAL HIGH (ref 0.7–1.3)
Gamma Glob SerPl Elph-Mcnc: 1.2 g/dL (ref 0.4–1.8)
Globulin, Total: 3.7 g/dL (ref 2.2–3.9)
IgA: 525 mg/dL — ABNORMAL HIGH (ref 87–352)
IgG (Immunoglobin G), Serum: 1435 mg/dL (ref 586–1602)
IgM (Immunoglobulin M), Srm: 48 mg/dL (ref 26–217)
Total Protein ELP: 7.7 g/dL (ref 6.0–8.5)

## 2019-06-13 NOTE — Telephone Encounter (Signed)
I spoke with the pt on 01/18/20201. She wanted to make me aware that she had reached out to her rheumatologist and was awaiting a response ret he likelihood that plaquenil may have contributed to her lymphoma and what if any dose adjustment was indicated

## 2019-06-15 ENCOUNTER — Encounter (HOSPITAL_COMMUNITY): Payer: Medicare Other

## 2019-06-16 ENCOUNTER — Ambulatory Visit (HOSPITAL_COMMUNITY): Payer: Medicare Other | Admitting: Hematology

## 2019-06-16 ENCOUNTER — Encounter: Payer: Self-pay | Admitting: Family Medicine

## 2019-06-16 ENCOUNTER — Other Ambulatory Visit: Payer: Self-pay | Admitting: Family Medicine

## 2019-06-16 MED ORDER — EPINEPHRINE 0.3 MG/0.3ML IJ SOAJ: 0.3000 mg | INTRAMUSCULAR | 2 refills | Status: DC | PRN

## 2019-06-17 ENCOUNTER — Ambulatory Visit (HOSPITAL_COMMUNITY)
Admission: RE | Admit: 2019-06-17 | Discharge: 2019-06-17 | Disposition: A | Discharge status: Home / Self Care | Payer: Medicare Other | Source: Ambulatory Visit | Attending: Hematology | Admitting: Hematology

## 2019-06-17 ENCOUNTER — Other Ambulatory Visit: Payer: Self-pay

## 2019-06-17 DIAGNOSIS — C884 Extranodal marginal zone B-cell lymphoma of mucosa-associated lymphoid tissue [MALT-lymphoma]: Secondary | ICD-10-CM | POA: Insufficient documentation

## 2019-06-17 DIAGNOSIS — Z79899 Other long term (current) drug therapy: Secondary | ICD-10-CM | POA: Diagnosis not present

## 2019-06-17 DIAGNOSIS — R9389 Abnormal findings on diagnostic imaging of other specified body structures: Secondary | ICD-10-CM | POA: Diagnosis not present

## 2019-06-17 LAB — GLUCOSE, CAPILLARY: Glucose-Capillary: 91 mg/dL (ref 70–99)

## 2019-06-21 ENCOUNTER — Other Ambulatory Visit: Payer: Self-pay

## 2019-06-21 ENCOUNTER — Encounter (HOSPITAL_COMMUNITY): Payer: Medicare Other

## 2019-06-21 ENCOUNTER — Encounter (HOSPITAL_COMMUNITY): Payer: Self-pay | Admitting: Hematology

## 2019-06-21 ENCOUNTER — Inpatient Hospital Stay (HOSPITAL_BASED_OUTPATIENT_CLINIC_OR_DEPARTMENT_OTHER): Payer: Medicare Other | Admitting: Hematology

## 2019-06-21 VITALS — BP 139/85 | HR 94 | Temp 97.5°F | Resp 18 | Wt 206.7 lb

## 2019-06-21 DIAGNOSIS — C884 Extranodal marginal zone B-cell lymphoma of mucosa-associated lymphoid tissue [MALT-lymphoma]: Secondary | ICD-10-CM

## 2019-06-21 DIAGNOSIS — M810 Age-related osteoporosis without current pathological fracture: Secondary | ICD-10-CM | POA: Diagnosis not present

## 2019-06-21 DIAGNOSIS — M069 Rheumatoid arthritis, unspecified: Secondary | ICD-10-CM | POA: Diagnosis not present

## 2019-06-21 DIAGNOSIS — C8590 Non-Hodgkin lymphoma, unspecified, unspecified site: Secondary | ICD-10-CM | POA: Diagnosis not present

## 2019-06-21 DIAGNOSIS — E785 Hyperlipidemia, unspecified: Secondary | ICD-10-CM | POA: Diagnosis not present

## 2019-06-21 DIAGNOSIS — I10 Essential (primary) hypertension: Secondary | ICD-10-CM | POA: Diagnosis not present

## 2019-06-21 NOTE — Assessment & Plan Note (Signed)
1.  Clinical stage III MALT lymphoma: -Routine mammogram on May 12, 2019 showed possible left breast mass. -Biopsy of the mass consistent with non-Hodgkin's lymphoma, specifically extranodal marginal zone lymphoma of mucosa associated lymphoid tissue with plasmacytic differentiation. -She does not have any B symptoms or recurrent infections. -CBC, uric acid, LDH, SPEP were normal.  Hepatitis B and C serology was also normal. -We reviewed results of PET CT scan dated 06/17/2019 which showed mildly hypermetabolic area in the left breast at the site of known lymphoma.  Intense FDG uptake within the retrobulbar and periorbital soft tissues, without signs of visible soft tissue mass or lesion.  Mildly hypermetabolic lymph nodes in the external iliac chains bilaterally.  Asymmetry of the tonsillar tissue with increased activity on the left side.  No hypermetabolic activity in the skeleton. -Based on the findings in the orbits, I have recommended MRI of the orbits with and without contrast. -She has a appointment with a lymphoma specialist at [facility] for second opinion. -Based on no B symptoms, I have recommended observation for her lymphoma.  We will see her back after the MRI to discuss the results.  2.  Rheumatoid arthritis: -She was diagnosed in her 29s.  She is on currently Plaquenil. -Her rheumatoid arthritis and or treatment could have potentially contributed to her lymphoma.

## 2019-06-21 NOTE — Progress Notes (Signed)
[name], [place] [address]   CLINIC:  Medical Oncology/Hematology  PCP:  Fayrene Helper, MD [address] [place] [address] [phone]   REASON FOR VISIT:  Follow-up for non-Hodgkin's lymphoma.  CURRENT THERAPY: Observation.    INTERVAL HISTORY:  [name] 69 y.o. female seen for follow-up of non-Hodgkin's lymphoma.  She had blood work and a PET CT scan done from last visit.  Denies any fevers, night sweats or weight loss.  Reports appetite 100%.  Energy levels are 75%.  Denies any recurrent infections or hospitalizations.    REVIEW OF SYSTEMS:  Review of Systems  All other systems reviewed and are negative.    PAST MEDICAL/SURGICAL HISTORY:  Past Medical History:  Diagnosis Date  . Arthritis, rheumatoid (Uncertain) 1993   hx  . Collagen vascular disease (HCC)    RA  . Hyperlipidemia   . Hypertension   . Kidney stone 1999  . Obesity   . Osteoporosis 2019   intolerant of fosamax due to jaw pain  . Porphyria ([place])    hx    Past Surgical History:  Procedure Laterality Date  . APPENDECTOMY  1983  . LITHOTRIPSY  2000   rt renal stone      SOCIAL HISTORY:  Social History   Socioeconomic History  . Marital status: Single    Spouse name: Not on file  . Number of children: 0  . Years of education: 12+  . Highest education level: Doctorate  Occupational History  . Occupation: Retired; Teacher part time currently  Tobacco Use  . Smoking status: Current Every Day Smoker    Packs/day: 0.50    Types: Cigarettes  . Smokeless tobacco: Never Used  Substance and Sexual Activity  . Alcohol use: No  . Drug use: No  . Sexual activity: Not Currently  Other Topics Concern  . Not on file  Social History Narrative   Lives alone    Social Determinants of Health   Financial Resource Strain: Low Risk   . Difficulty of Paying Living Expenses: Not hard at all  Food Insecurity: No Food Insecurity  .  Worried About Charity fundraiser in the Last Year: Never true  . Ran Out of Food in the Last Year: Never true  Transportation Needs: No Transportation Needs  . Lack of Transportation (Medical): No  . Lack of Transportation (Non-Medical): No  Physical Activity: Insufficiently Active  . Days of Exercise per Week: 1 day  . Minutes of Exercise per Session: 60 min  Stress: Stress Concern Present  . Feeling of Stress : Very much  Social Connections: Slightly Isolated  . Frequency of Communication with Friends and Family: More than three times a week  . Frequency of Social Gatherings with Friends and Family: More than three times a week  . Attends Religious Services: More than 4 times per year  . Active Member of Clubs or Organizations: Yes  . Attends Archivist Meetings: More than 4 times per year  . Marital Status: Never married  Intimate Partner Violence: Not At Risk  . Fear of Current or Ex-Partner: No  . Emotionally Abused: No  . Physically Abused: No  . Sexually Abused: No    FAMILY HISTORY:  Family History  Problem Relation Age of Onset  . Arthritis Mother   . Hypertension Mother   . Hypertension Sister   . Arthritis Sister   . Hypertension Sister   .  Arthritis Sister   . Ulcerative colitis Sister   . Hypertension Sister   . Arthritis Sister   . Stroke Maternal Grandmother   . Arthritis Maternal Grandfather   . Arthritis Paternal Grandmother   . Stroke Paternal Grandfather     CURRENT MEDICATIONS:  Outpatient Encounter Medications as of 06/21/2019  Medication Sig  . alendronate (FOSAMAX) 70 MG tablet Take 1 tablet (70 mg total) by mouth once a week. Take with a full glass of water on an empty stomach.  Marland Kitchen aspirin (ASPIRIN LOW DOSE) 81 MG EC tablet Take 81 mg by mouth daily.    . Calcium Carbonate-Vitamin D (CALTRATE 600+D) 600-400 MG-UNIT per tablet Take 1 tablet by mouth 2 (two) times daily.    . Cholecalciferol (VITAMIN D3) 5000 units TABS Take 1 tablet by  mouth daily.  Marland Kitchen co-enzyme Q-10 30 MG capsule Take 30 mg by mouth 3 (three) times daily.  . fluticasone (FLONASE) 50 MCG/ACT nasal spray Place 2 sprays into both nostrils daily.  Marland Kitchen gabapentin (NEURONTIN) 100 MG capsule Take 1 capsule (100 mg total) by mouth at bedtime.  . hydroxychloroquine (PLAQUENIL) 200 MG tablet Take 400 mg by mouth daily.   Marland Kitchen loratadine (CLARITIN) 10 MG tablet Take 10 mg by mouth daily.  Marland Kitchen losartan (COZAAR) 50 MG tablet Take 1 tablet (50 mg total) by mouth daily.  . montelukast (SINGULAIR) 10 MG tablet Take 1 tablet (10 mg total) by mouth at bedtime.  . Multiple Vitamins-Minerals (CENTRUM SILVER PO) Take by mouth daily.    . Omega-3 Fatty Acids (FISH OIL) 1000 MG CAPS Take 1,000 capsules by mouth daily.   Marland Kitchen omeprazole (PRILOSEC) 20 MG capsule Take 20 mg by mouth daily.  . pravastatin (PRAVACHOL) 10 MG tablet TAKE 1 TABLET DAILY STOP   TAKING CRESTOR  . Turmeric (QC TUMERIC COMPLEX) 500 MG CAPS Take 1 capsule by mouth.  . vitamin B-12 (CYANOCOBALAMIN) 50 MCG tablet Take 50 mcg by mouth daily.  Marland Kitchen acetaminophen (TYLENOL) 500 MG tablet Take 1,000 mg by mouth every 6 (six) hours as needed.  Marland Kitchen albuterol (PROVENTIL HFA;VENTOLIN HFA) 108 (90 Base) MCG/ACT inhaler Inhale 2 puffs into the lungs every 6 (six) hours as needed for wheezing or shortness of breath. (Patient not taking: Reported on 06/06/2019)  . ALPRAZolam (XANAX) 0.25 MG tablet Take 1 tablet (0.25 mg total) by mouth at bedtime as needed for anxiety. (Patient not taking: Reported on 06/06/2019)  . EPINEPHrine 0.3 mg/0.3 mL IJ SOAJ injection Inject 0.3 mLs (0.3 mg total) into the muscle as needed for anaphylaxis. (Patient not taking: Reported on 06/21/2019)  . hydrOXYzine (ATARAX/VISTARIL) 50 MG tablet One tablet at bedtime, as needed,  for sleep and anxiety (Patient not taking: Reported on 06/06/2019)  . Zinc 50 MG TABS Take 50 mg by mouth daily.   No facility-administered encounter medications on file as of 06/21/2019.     ALLERGIES:  Allergies  Allergen Reactions  . Septra [Sulfamethoxazole-Trimethoprim] Dermatitis  . Barbiturates   . Cefuroxime Axetil   . Chlordiazepoxide   . Hydrocodone-Acetaminophen   . Phenytoin   . Rofecoxib      PHYSICAL EXAM:  ECOG Performance status: 1  Vitals:   06/21/19 0758  BP: 139/85  Pulse: 94  Resp: 18  Temp: (!) 97.5 F (36.4 C)  SpO2: 100%   Filed Weights   06/21/19 0758  Weight: 206 lb 11.2 oz (93.8 kg)    Physical Exam Vitals reviewed.  Constitutional:      Appearance: Normal  appearance.  Cardiovascular:     Rate and Rhythm: Normal rate and regular rhythm.     Heart sounds: Normal heart sounds.  Pulmonary:     Effort: Pulmonary effort is normal.     Breath sounds: Normal breath sounds.  Abdominal:     General: There is no distension.     Palpations: Abdomen is soft. There is no mass.  Skin:    General: Skin is warm.  Neurological:     General: No focal deficit present.     Mental Status: She is alert and oriented to person, place, and time.  Psychiatric:        Mood and Affect: Mood normal.        Behavior: Behavior normal.      LABORATORY DATA:  I have reviewed the labs as listed.  CBC    Component Value Date/Time   WBC 9.8 06/07/2019 1023   RBC 5.08 06/07/2019 1023   HGB 14.0 06/07/2019 1023   HCT 44.3 06/07/2019 1023   PLT 264 06/07/2019 1023   MCV 87.2 06/07/2019 1023   MCH 27.6 06/07/2019 1023   MCHC 31.6 06/07/2019 1023   RDW 14.9 06/07/2019 1023   LYMPHSABS 1.7 06/07/2019 1023   MONOABS 0.6 06/07/2019 1023   EOSABS 0.1 06/07/2019 1023   BASOSABS 0.0 06/07/2019 1023   CMP Latest Ref Rng & Units 06/07/2019 11/17/2018 05/10/2018  Glucose 70 - 99 mg/dL 95 92 85  BUN 8 - 23 mg/dL 17 14 13   Creatinine 0.44 - 1.00 mg/dL 0.79 0.85 0.75  Sodium 135 - 145 mmol/L 137 140 140  Potassium 3.5 - 5.1 mmol/L 4.2 4.3 4.2  Chloride 98 - 111 mmol/L 103 104 106  CO2 22 - 32 mmol/L 26 29 27   Calcium 8.9 - 10.3 mg/dL 9.4 9.1 9.1   Total Protein 6.5 - 8.1 g/dL 8.0 6.6 6.5  Total Bilirubin 0.3 - 1.2 mg/dL 0.6 0.3 0.4  Alkaline Phos 38 - 126 U/L 61 - -  AST 15 - 41 U/L 21 17 16   ALT 0 - 44 U/L 20 12 13        DIAGNOSTIC IMAGING:  I have independently reviewed the scans and discussed with the patient.    ASSESSMENT & PLAN:   MALT lymphoma ([place]) 1.  Clinical stage III MALT lymphoma: -Routine mammogram on May 12, 2019 showed possible left breast mass. -Biopsy of the mass consistent with non-Hodgkin's lymphoma, specifically extranodal marginal zone lymphoma of mucosa associated lymphoid tissue with plasmacytic differentiation. -She does not have any B symptoms or recurrent infections. -CBC, uric acid, LDH, SPEP were normal.  Hepatitis B and C serology was also normal. -We reviewed results of PET CT scan dated 06/17/2019 which showed mildly hypermetabolic area in the left breast at the site of known lymphoma.  Intense FDG uptake within the retrobulbar and periorbital soft tissues, without signs of visible soft tissue mass or lesion.  Mildly hypermetabolic lymph nodes in the external iliac chains bilaterally.  Asymmetry of the tonsillar tissue with increased activity on the left side.  No hypermetabolic activity in the skeleton. -Based on the findings in the orbits, I have recommended MRI of the orbits with and without contrast. -She has a appointment with a lymphoma specialist at [facility] for second opinion. -Based on no B symptoms, I have recommended observation for her lymphoma.  We will see her back after the MRI to discuss the results.  2.  Rheumatoid arthritis: -She was diagnosed in her 75s.  She  is on currently Plaquenil. -Her rheumatoid arthritis and or treatment could have potentially contributed to her lymphoma.      Orders placed this encounter:  Orders Placed This Encounter  Procedures  . MR ORBITS W WO CONTRAST   Total time spent is 30 minutes with more than 50% of time spent face-to-face  discussing scan results, further management, counseling and coordination of care.   Derek Jack, MD [place] [phone]

## 2019-06-21 NOTE — Patient Instructions (Addendum)
[place] at [facility] Discharge Instructions  You were seen today by Dr. Delton Coombes. He went over your recent test results. Based on these results you do not need treatment at this time. He will schedule you for a MRI of your orbits. He will follow up with you by phone for follow up.   Thank you for choosing [place] at [facility] to provide your oncology and hematology care.  To afford each patient quality time with our provider, please arrive at least 15 minutes before your scheduled appointment time.   If you have a lab appointment with the [facility] please come in thru the  Main Entrance and check in at the main information desk  You need to re-schedule your appointment should you arrive 10 or more minutes late.  We strive to give you quality time with our providers, and arriving late affects you and other patients whose appointments are after yours.  Also, if you no show three or more times for appointments you may be dismissed from the clinic at the providers discretion.     Again, thank you for choosing [facility].  Our hope is that these requests will decrease the amount of time that you wait before being seen by our physicians.       _____________________________________________________________  Should you have questions after your visit to [facility], please contact our office at [phone] between the hours of 8:00 a.m. and 4:30 p.m.  Voicemails left after 4:00 p.m. will not be returned until the following business day.  For prescription refill requests, have your pharmacy contact our office and allow 72 hours.    Cancer Center Support Programs:   > Cancer Support Group  2nd Tuesday of the month 1pm-2pm, Journey Room

## 2019-06-22 DIAGNOSIS — C884 Extranodal marginal zone B-cell lymphoma of mucosa-associated lymphoid tissue [MALT-lymphoma]: Secondary | ICD-10-CM | POA: Diagnosis not present

## 2019-06-23 ENCOUNTER — Ambulatory Visit (HOSPITAL_COMMUNITY): Payer: Medicare Other | Admitting: Hematology

## 2019-06-24 ENCOUNTER — Encounter: Payer: Self-pay | Admitting: Family Medicine

## 2019-06-27 ENCOUNTER — Other Ambulatory Visit: Payer: Self-pay

## 2019-06-27 ENCOUNTER — Ambulatory Visit (INDEPENDENT_AMBULATORY_CARE_PROVIDER_SITE_OTHER): Payer: Medicare Other | Admitting: Family Medicine

## 2019-06-27 ENCOUNTER — Encounter: Payer: Self-pay | Admitting: Family Medicine

## 2019-06-27 VITALS — BP 124/83 | Ht 67.0 in | Wt 206.0 lb

## 2019-06-27 DIAGNOSIS — J309 Allergic rhinitis, unspecified: Secondary | ICD-10-CM

## 2019-06-27 DIAGNOSIS — F409 Phobic anxiety disorder, unspecified: Secondary | ICD-10-CM

## 2019-06-27 DIAGNOSIS — F17210 Nicotine dependence, cigarettes, uncomplicated: Secondary | ICD-10-CM

## 2019-06-27 DIAGNOSIS — R7303 Prediabetes: Secondary | ICD-10-CM | POA: Diagnosis not present

## 2019-06-27 DIAGNOSIS — I10 Essential (primary) hypertension: Secondary | ICD-10-CM

## 2019-06-27 DIAGNOSIS — F5105 Insomnia due to other mental disorder: Secondary | ICD-10-CM

## 2019-06-27 DIAGNOSIS — M05769 Rheumatoid arthritis with rheumatoid factor of unspecified knee without organ or systems involvement: Secondary | ICD-10-CM

## 2019-06-27 DIAGNOSIS — C884 Extranodal marginal zone B-cell lymphoma of mucosa-associated lymphoid tissue [MALT-lymphoma]: Secondary | ICD-10-CM | POA: Diagnosis not present

## 2019-06-27 DIAGNOSIS — E785 Hyperlipidemia, unspecified: Secondary | ICD-10-CM | POA: Diagnosis not present

## 2019-06-27 MED ORDER — PRAVASTATIN SODIUM 10 MG PO TABS: ORAL_TABLET | ORAL | 1 refills | Status: DC

## 2019-06-27 MED ORDER — GABAPENTIN 100 MG PO CAPS: 100.0000 mg | ORAL_CAPSULE | Freq: Every day | ORAL | 1 refills | Status: DC

## 2019-06-27 MED ORDER — LOSARTAN POTASSIUM 50 MG PO TABS: 50.0000 mg | ORAL_TABLET | Freq: Every day | ORAL | 1 refills | Status: DC

## 2019-06-27 MED ORDER — BUPROPION HCL ER (SMOKING DET) 150 MG PO TB12: 150.0000 mg | ORAL_TABLET | Freq: Two times a day (BID) | ORAL | 5 refills | Status: DC

## 2019-06-27 NOTE — Patient Instructions (Addendum)
F/U with MD in office in 3 months, re evaluate smoking and follow up medical conditions  Thankful that your recent results are reassuring , continue to persevere in great health habits  QUIT date for smoking 07/11/2019! New in addition to gum/ patch is wellbutrin( zyban) Please call to schedule your colonoscopy in next 4 to 5 months  Please get fasting lipid, cmp and EGFr and TSH in the next 4 to 6 weeks  New to help with quitting smoking is zyban, take once daily for 1 week, then increase to one ablet twice daily as on bottle. PLAN to QUIT nicotine in 2 weeks. You may use nicotine substitutes if you must, for a few weeks , when you get a ver strong craving  It is important that you exercise regularly at least 30 minutes 5 times a week. If you develop chest pain, have severe difficulty breathing, or feel very tired, stop exercising immediately and seek medical attention   Think about what you will eat, plan ahead. Choose " clean, green, fresh or frozen" over canned, processed or packaged foods which are more sugary, salty and fatty. 70 to 75% of food eaten should be vegetables and fruit. Three meals at set times with snacks allowed between meals, but they must be fruit or vegetables. Aim to eat over a 12 hour period , example 7 am to 7 pm, and STOP after  your last meal of the day. Drink water,generally about 64 ounces per day, no other drink is as healthy. Fruit juice is best enjoyed in a healthy way, by EATING the fruit.      Coping with Quitting Smoking  Quitting smoking is a physical and mental challenge. You will face cravings, withdrawal symptoms, and temptation. Before quitting, work with your health care provider to make a plan that can help you cope. Preparation can help you quit and keep you from giving in. How can I cope with cravings? Cravings usually last for 5-10 minutes. If you get through it, the craving will pass. Consider taking the following actions to help you cope  with cravings:  Keep your mouth busy: ? Chew sugar-free gum. ? Suck on hard candies or a straw. ? Brush your teeth.  Keep your hands and body busy: ? Immediately change to a different activity when you feel a craving. ? Squeeze or play with a ball. ? Do an activity or a hobby, like making bead jewelry, practicing needlepoint, or working with wood. ? Mix up your normal routine. ? Take a short exercise break. Go for a quick walk or run up and down stairs. ? Spend time in public places where smoking is not allowed.  Focus on doing something kind or helpful for someone else.  Call a friend or family member to talk during a craving.  Join a support group.  Call a quit line, such as 1-800-QUIT-NOW.  Talk with your health care provider about medicines that might help you cope with cravings and make quitting easier for you. How can I deal with withdrawal symptoms? Your body may experience negative effects as it tries to get used to not having nicotine in the system. These effects are called withdrawal symptoms. They may include:  Feeling hungrier than normal.  Trouble concentrating.  Irritability.  Trouble sleeping.  Feeling depressed.  Restlessness and agitation.  Craving a cigarette. To manage withdrawal symptoms:  Avoid places, people, and activities that trigger your cravings.  Remember why you want to quit.  Get plenty of  sleep.  Avoid coffee and other caffeinated drinks. These may worsen some of your symptoms. How can I handle social situations? Social situations can be difficult when you are quitting smoking, especially in the first few weeks. To manage this, you can:  Avoid parties, bars, and other social situations where people might be smoking.  Avoid alcohol.  Leave right away if you have the urge to smoke.  Explain to your family and friends that you are quitting smoking. Ask for understanding and support.  Plan activities with friends or family where  smoking is not an option. What are some ways I can cope with stress? Wanting to smoke may cause stress, and stress can make you want to smoke. Find ways to manage your stress. Relaxation techniques can help. For example:  Breathe slowly and deeply, in through your nose and out through your mouth.  Listen to soothing, relaxing music.  Talk with a family member or friend about your stress.  Light a candle.  Soak in a bath or take a shower.  Think about a peaceful place. What are some ways I can prevent weight gain? Be aware that many people gain weight after they quit smoking. However, not everyone does. To keep from gaining weight, have a plan in place before you quit and stick to the plan after you quit. Your plan should include:  Having healthy snacks. When you have a craving, it may help to: ? Eat plain popcorn, crunchy carrots, celery, or other cut vegetables. ? Chew sugar-free gum.  Changing how you eat: ? Eat small portion sizes at meals. ? Eat 4-6 small meals throughout the day instead of 1-2 large meals a day. ? Be mindful when you eat. Do not watch television or do other things that might distract you as you eat.  Exercising regularly: ? Make time to exercise each day. If you do not have time for a long workout, do short bouts of exercise for 5-10 minutes several times a day. ? Do some form of strengthening exercise, like weight lifting, and some form of aerobic exercise, like running or swimming.  Drinking plenty of water or other low-calorie or no-calorie drinks. Drink 6-8 glasses of water daily, or as much as instructed by your health care provider. Summary  Quitting smoking is a physical and mental challenge. You will face cravings, withdrawal symptoms, and temptation to smoke again. Preparation can help you as you go through these challenges.  You can cope with cravings by keeping your mouth busy (such as by chewing gum), keeping your body and hands busy, and making  calls to family, friends, or a helpline for people who want to quit smoking.  You can cope with withdrawal symptoms by avoiding places where people smoke, avoiding drinks with caffeine, and getting plenty of rest.  Ask your health care provider about the different ways to prevent weight gain, avoid stress, and handle social situations. This information is not intended to replace advice given to you by your health care provider. Make sure you discuss any questions you have with your health care provider. Document Revised: 04/24/2017 Document Reviewed: 05/09/2016 Elsevier Patient Education  2020 [employer].  Steps to Quit Smoking Smoking tobacco is the leading cause of preventable death. It can affect almost every organ in the body. Smoking puts you and people around you at risk for many serious, long-lasting (chronic) diseases. Quitting smoking can be hard, but it is one of the best things that you can do for your  health. It is never too late to quit. How do I get ready to quit? When you decide to quit smoking, make a plan to help you succeed. Before you quit:  Pick a date to quit. Set a date within the next 2 weeks to give you time to prepare.  Write down the reasons why you are quitting. Keep this list in places where you will see it often.  Tell your family, friends, and co-workers that you are quitting. Their support is important.  Talk with your doctor about the choices that may help you quit.  Find out if your health insurance will pay for these treatments.  Know the people, places, things, and activities that make you want to smoke (triggers). Avoid them. What first steps can I take to quit smoking?  Throw away all cigarettes at home, at work, and in your car.  Throw away the things that you use when you smoke, such as ashtrays and lighters.  Clean your car. Make sure to empty the ashtray.  Clean your home, including curtains and carpets. What can I do to help me quit  smoking? Talk with your doctor about taking medicines and seeing a counselor at the same time. You are more likely to succeed when you do both.  If you are pregnant or breastfeeding, talk with your doctor about counseling or other ways to quit smoking. Do not take medicine to help you quit smoking unless your doctor tells you to do so. To quit smoking: Quit right away  Quit smoking totally, instead of slowly cutting back on how much you smoke over a period of time.  Go to counseling. You are more likely to quit if you go to counseling sessions regularly. Take medicine You may take medicines to help you quit. Some medicines need a prescription, and some you can buy over-the-counter. Some medicines may contain a drug called nicotine to replace the nicotine in cigarettes. Medicines may:  Help you to stop having the desire to smoke (cravings).  Help to stop the problems that come when you stop smoking (withdrawal symptoms). Your doctor may ask you to use:  Nicotine patches, gum, or lozenges.  Nicotine inhalers or sprays.  Non-nicotine medicine that is taken by mouth. Find resources Find resources and other ways to help you quit smoking and remain smoke-free after you quit. These resources are most helpful when you use them often. They include:  Online chats with a Social worker.  Phone quitlines.  Printed Furniture conservator/restorer.  Support groups or group counseling.  Text messaging programs.  Mobile phone apps. Use apps on your mobile phone or tablet that can help you stick to your quit plan. There are many free apps for mobile phones and tablets as well as websites. Examples include Quit Guide from the [employer] and smokefree.gov  What things can I do to make it easier to quit?   Talk to your family and friends. Ask them to support and encourage you.  Call a phone quitline (1-800-QUIT-NOW), reach out to support groups, or work with a Social worker.  Ask people who smoke to not smoke around  you.  Avoid places that make you want to smoke, such as: ? Bars. ? Parties. ? Smoke-break areas at work.  Spend time with people who do not smoke.  Lower the stress in your life. Stress can make you want to smoke. Try these things to help your stress: ? Getting regular exercise. ? Doing deep-breathing exercises. ? Doing yoga. ? Meditating. ?  Doing a body scan. To do this, close your eyes, focus on one area of your body at a time from head to toe. Notice which parts of your body are tense. Try to relax the muscles in those areas. How will I feel when I quit smoking? Day 1 to 3 weeks Within the first 24 hours, you may start to have some problems that come from quitting tobacco. These problems are very bad 2-3 days after you quit, but they do not often last for more than 2-3 weeks. You may get these symptoms:  Mood swings.  Feeling restless, nervous, angry, or annoyed.  Trouble concentrating.  Dizziness.  Strong desire for high-sugar foods and nicotine.  Weight gain.  Trouble pooping (constipation).  Feeling like you may vomit (nausea).  Coughing or a sore throat.  Changes in how the medicines that you take for other issues work in your body.  Depression.  Trouble sleeping (insomnia). Week 3 and afterward After the first 2-3 weeks of quitting, you may start to notice more positive results, such as:  Better sense of smell and taste.  Less coughing and sore throat.  Slower heart rate.  Lower blood pressure.  Clearer skin.  Better breathing.  Fewer sick days. Quitting smoking can be hard. Do not give up if you fail the first time. Some people need to try a few times before they succeed. Do your best to stick to your quit plan, and talk with your doctor if you have any questions or concerns. Summary  Smoking tobacco is the leading cause of preventable death. Quitting smoking can be hard, but it is one of the best things that you can do for your health.  When  you decide to quit smoking, make a plan to help you succeed.  Quit smoking right away, not slowly over a period of time.  When you start quitting, seek help from your doctor, family, or friends. This information is not intended to replace advice given to you by your health care provider. Make sure you discuss any questions you have with your health care provider. Document Revised: 02/04/2019 Document Reviewed: 07/31/2018 Elsevier Patient Education  [employer].

## 2019-06-27 NOTE — Progress Notes (Signed)
Virtual Visit via Telephone Note  I connected with [name] on 06/27/19 at  3:00 PM EST by telephone and verified that I am speaking with the correct person using two identifiers.  Location: Patient: HOME Provider: Office   I discussed the limitations, risks, security and privacy concerns of performing an evaluation and management service by telephone and the availability of in person appointments. I also discussed with the patient that there may be a patient responsible charge related to this service. The patient expressed understanding and agreed to proceed.   History of Present Illness: F/U chronic problems, medication review, and refill medication when necessary. Review most recent labs and order labs which are due Review preventive health and update with necessary referrals or immunizations as indicated. Will set up for colonoscopy later in the year, asymptomatic and no known f/h of colon cancer AFTER recent evaluation at [facility], The oncology, she is being scheduled with radiation to the breast, and will have this locally Has upcoming first covid vaccine, which is great, feels mentally relieved with the diagnosis of local disease, has  Additional imaging re area of concern around the eye later in the week Denies recent fever or chills. Denies sinus pressure, nasal congestion, ear pain or sore throat. Denies chest congestion, productive cough or wheezing. Denies chest pains, palpitations and leg swelling Denies abdominal pain, nausea, vomiting,diarrhea or constipation.   Denies dysuria, frequency, hesitancy or incontinence. Denies uncontrolled  joint pain, swelling and limitation in mobility. Denies headaches, seizures, numbness, or tingling. Denies depression,unontroled  anxiety or insomnia.still relying on as needed xanax, which I advise is oK Denies skin break down or rash.       Observations/Objective: BP 124/83   Ht 5\' 7"  (1.702 m)   Wt 206 lb (93.4 kg)   BMI 32.26 kg/m   Good communication with no confusion and intact memory. Alert and oriented x 3 No signs of respiratory distress during speech    Assessment and Plan: Essential hypertension Controlled, no change in medication DASH diet and commitment to daily physical activity for a minimum of 30 minutes discussed and encouraged, as a part of hypertension management. The importance of attaining a healthy weight is also discussed.  BP/Weight 06/27/2019 06/21/2019 06/07/2019 05/02/2019 11/23/2018 04/28/2018 0000000  Systolic BP A999333 [id number] Q000111Q 123XX123 [id number] Q000111Q 123XX123  Diastolic BP 83 85 83 74 74 86 76  Wt. (Lbs) 206 206.7 201.1 201 201 203 206.08  BMI 32.26 32.37 31.5 31.48 31.48 31.79 32.28       Allergic rhinitis Controlled, no change in medication   Hyperlipidemia LDL goal <100 Hyperlipidemia:Low fat diet discussed and encouraged.   Lipid Panel  Lab Results  Component Value Date   CHOL 196 11/17/2018   HDL 52 11/17/2018   LDLCALC 122 (H) 11/17/2018   TRIG 108 11/17/2018   CHOLHDL 3.8 11/17/2018   Not at goal, needs to reduce fat intake Updated lab needed at/ before next visit.     Rheumatoid arthritis ([place]) Managed by rheumatology and controlled  MALT lymphoma ([place]) Managed by oncology, radiation planned per pt, dx  As localized disease on mammogram in 2021  Insomnia due to anxiety and fear Currently improved as lymphoma diagnosis is clarified, will continue with as needed, low dose xanax for bedtime and increase reliance on behavioral management of the is , she is total agreement of this plan  Cigarette smoker Asked:confirms currently smokes cigarettes Assess: Unwilling to quit but cutting back Advise: needs to QUIT to reduce risk of  cancer, cardio and cerebrovascular disease Assist: counseled for 5 minutes and literature provided Arrange: follow up in 3 months Continue gum/ patch and add wellbutrin     Follow Up Instructions:    I discussed the assessment and treatment plan  with the patient. The patient was provided an opportunity to ask questions and all were answered. The patient agreed with the plan and demonstrated an understanding of the instructions.   The patient was advised to call back or seek an in-person evaluation if the symptoms worsen or if the condition fails to improve as anticipated.  I provided 22 minutes of non-face-to-face time during this encounter.   Tula Nakayama, MD

## 2019-06-28 ENCOUNTER — Ambulatory Visit: Payer: Medicare Other | Admitting: Family Medicine

## 2019-06-29 ENCOUNTER — Ambulatory Visit (HOSPITAL_COMMUNITY)
Admission: RE | Admit: 2019-06-29 | Discharge: 2019-06-29 | Disposition: A | Discharge status: Home / Self Care | Payer: Medicare Other | Source: Ambulatory Visit | Attending: Hematology | Admitting: Hematology

## 2019-06-29 ENCOUNTER — Other Ambulatory Visit: Payer: Self-pay

## 2019-06-29 DIAGNOSIS — C884 Extranodal marginal zone B-cell lymphoma of mucosa-associated lymphoid tissue [MALT-lymphoma]: Secondary | ICD-10-CM | POA: Diagnosis not present

## 2019-06-29 DIAGNOSIS — C8590 Non-Hodgkin lymphoma, unspecified, unspecified site: Secondary | ICD-10-CM

## 2019-06-29 MED ORDER — GADOBUTROL 1 MMOL/ML IV SOLN
10.0000 mL | Freq: Once | INTRAVENOUS | Status: AC | PRN
  Administered 2019-06-29: 10 mL via INTRAVENOUS

## 2019-07-03 ENCOUNTER — Encounter: Payer: Self-pay | Admitting: Family Medicine

## 2019-07-03 DIAGNOSIS — F17210 Nicotine dependence, cigarettes, uncomplicated: Secondary | ICD-10-CM | POA: Insufficient documentation

## 2019-07-03 DIAGNOSIS — F409 Phobic anxiety disorder, unspecified: Secondary | ICD-10-CM | POA: Insufficient documentation

## 2019-07-03 NOTE — Assessment & Plan Note (Signed)
Hyperlipidemia:Low fat diet discussed and encouraged.   Lipid Panel  Lab Results  Component Value Date   CHOL 196 11/17/2018   HDL 52 11/17/2018   LDLCALC 122 (H) 11/17/2018   TRIG 108 11/17/2018   CHOLHDL 3.8 11/17/2018   Not at goal, needs to reduce fat intake Updated lab needed at/ before next visit.

## 2019-07-03 NOTE — Assessment & Plan Note (Signed)
Controlled, no change in medication DASH diet and commitment to daily physical activity for a minimum of 30 minutes discussed and encouraged, as a part of hypertension management. The importance of attaining a healthy weight is also discussed.  BP/Weight 06/27/2019 06/21/2019 06/07/2019 05/02/2019 11/23/2018 04/28/2018 0000000  Systolic BP A999333 [id number] Q000111Q 123XX123 [id number] Q000111Q 123XX123  Diastolic BP 83 85 83 74 74 86 76  Wt. (Lbs) 206 206.7 201.1 201 201 203 206.08  BMI 32.26 32.37 31.5 31.48 31.48 31.79 32.28

## 2019-07-03 NOTE — Assessment & Plan Note (Signed)
Managed by oncology, radiation planned per pt, dx  As localized disease on mammogram in 2021

## 2019-07-03 NOTE — Assessment & Plan Note (Signed)
Controlled, no change in medication  

## 2019-07-03 NOTE — Assessment & Plan Note (Signed)
Currently improved as lymphoma diagnosis is clarified, will continue with as needed, low dose xanax for bedtime and increase reliance on behavioral management of the is , she is total agreement of this plan

## 2019-07-03 NOTE — Assessment & Plan Note (Addendum)
Asked:confirms currently smokes cigarettes Assess: Unwilling to quit but cutting back Advise: needs to QUIT to reduce risk of cancer, cardio and cerebrovascular disease Assist: counseled for 5 minutes and literature provided Arrange: follow up in 3 months Continue gum/ patch and add wellbutrin

## 2019-07-03 NOTE — Assessment & Plan Note (Signed)
Managed by rheumatology and controlled

## 2019-07-06 ENCOUNTER — Encounter (HOSPITAL_COMMUNITY): Payer: Self-pay

## 2019-07-07 ENCOUNTER — Encounter (HOSPITAL_COMMUNITY): Payer: Self-pay | Admitting: Hematology

## 2019-07-07 ENCOUNTER — Inpatient Hospital Stay (HOSPITAL_COMMUNITY): Payer: Medicare Other | Attending: Hematology | Admitting: Hematology

## 2019-07-07 ENCOUNTER — Other Ambulatory Visit: Payer: Self-pay

## 2019-07-07 DIAGNOSIS — C884 Extranodal marginal zone B-cell lymphoma of mucosa-associated lymphoid tissue [MALT-lymphoma]: Secondary | ICD-10-CM

## 2019-07-07 NOTE — Progress Notes (Signed)
Virtual Visit via Telephone Note  I connected with [name] on 07/07/19 at  4:05 PM EST by telephone and verified that I am speaking with the correct person using two identifiers.   I discussed the limitations, risks, security and privacy concerns of performing an evaluation and management service by telephone and the availability of in person appointments. I also discussed with the patient that there may be a patient responsible charge related to this service. The patient expressed understanding and agreed to proceed.   History of Present Illness: She had routine mammogram on 05/12/2019 showed possible left breast mass.  Biopsy of the mass consistent with NHL, specifically extranodal marginal zone lymphoma of mucosa associated lymphoid tissue with plasmacytic differentiation.  She does not have any B symptoms or recurrent infections.  LDH was normal.   Observations/Objective: Denies any fevers, night sweats or weight loss.  Appetite and energy levels are 100%.  No new pains reported.  Denies any vision changes or headaches.  Assessment and Plan:  1.  Extranodal MALT lymphoma, of the left breast: -PET scan on 06/17/2019 showed mildly hypermetabolic activity in the left breast and mild hypermetabolic lymph nodes in the external iliac chain bilaterally.  There is intense FDG uptake within the retrobulbar and periorbital soft tissues, without signs of visible mass or lesion. -We reviewed MRI of orbits results from 06/29/2019.  No abnormal contrast-enhancement seen in the orbits.  No evidence of lymphoma infiltration. -I have recommended radiation therapy to the breast mass.  We will make a referral to [place]. -We will follow up on PET scan 6 months after completion of radiation. -I will see her back in 3 months with repeat labs.  2.  Rheumatoid arthritis: -She was diagnosed in her 54s.  She is currently on Plaquenil. -Her rheumatoid arthritis and/or treatment could have potentially  contributed to her lymphoma.   Follow Up Instructions: RTC 3 months with labs.   I discussed the assessment and treatment plan with the patient. The patient was provided an opportunity to ask questions and all were answered. The patient agreed with the plan and demonstrated an understanding of the instructions.   The patient was advised to call back or seek an in-person evaluation if the symptoms worsen or if the condition fails to improve as anticipated.  I provided 11 minutes of non-face-to-face time during this encounter.   Derek Jack, MD

## 2019-07-15 ENCOUNTER — Other Ambulatory Visit: Payer: Self-pay | Admitting: Family Medicine

## 2019-07-18 MED ORDER — ALENDRONATE SODIUM 70 MG PO TABS: 70.0000 mg | ORAL_TABLET | ORAL | 0 refills | Status: DC

## 2019-07-19 DIAGNOSIS — C8599 Non-Hodgkin lymphoma, unspecified, extranodal and solid organ sites: Secondary | ICD-10-CM | POA: Diagnosis not present

## 2019-07-19 DIAGNOSIS — C884 Extranodal marginal zone B-cell lymphoma of mucosa-associated lymphoid tissue [MALT-lymphoma]: Secondary | ICD-10-CM | POA: Diagnosis not present

## 2019-07-19 DIAGNOSIS — N6320 Unspecified lump in the left breast, unspecified quadrant: Secondary | ICD-10-CM | POA: Diagnosis not present

## 2019-07-20 ENCOUNTER — Other Ambulatory Visit (HOSPITAL_COMMUNITY): Payer: Self-pay | Admitting: Radiation Oncology

## 2019-07-20 DIAGNOSIS — C884 Extranodal marginal zone b-cell lymphoma of mucosa-associated lymphoid tissue (malt-lymphoma) not having achieved remission: Secondary | ICD-10-CM

## 2019-07-20 DIAGNOSIS — N6320 Unspecified lump in the left breast, unspecified quadrant: Secondary | ICD-10-CM

## 2019-07-26 DIAGNOSIS — C884 Extranodal marginal zone B-cell lymphoma of mucosa-associated lymphoid tissue [MALT-lymphoma]: Secondary | ICD-10-CM | POA: Diagnosis not present

## 2019-07-28 ENCOUNTER — Other Ambulatory Visit: Payer: Self-pay

## 2019-07-28 ENCOUNTER — Ambulatory Visit (HOSPITAL_COMMUNITY)
Admission: RE | Admit: 2019-07-28 | Discharge: 2019-07-28 | Disposition: A | Discharge status: Home / Self Care | Payer: Medicare Other | Source: Ambulatory Visit | Attending: Radiation Oncology | Admitting: Radiation Oncology

## 2019-07-28 DIAGNOSIS — N6320 Unspecified lump in the left breast, unspecified quadrant: Secondary | ICD-10-CM | POA: Diagnosis not present

## 2019-07-28 DIAGNOSIS — C884 Extranodal marginal zone B-cell lymphoma of mucosa-associated lymphoid tissue [MALT-lymphoma]: Secondary | ICD-10-CM | POA: Diagnosis not present

## 2019-07-28 DIAGNOSIS — C50212 Malignant neoplasm of upper-inner quadrant of left female breast: Secondary | ICD-10-CM | POA: Diagnosis not present

## 2019-07-28 LAB — POCT I-STAT CREATININE: Creatinine, Ser: 1 mg/dL (ref 0.44–1.00)

## 2019-07-28 MED ORDER — GADOBUTROL 1 MMOL/ML IV SOLN
9.0000 mL | Freq: Once | INTRAVENOUS | Status: AC | PRN
  Administered 2019-07-28: 9 mL via INTRAVENOUS

## 2019-08-03 DIAGNOSIS — C884 Extranodal marginal zone B-cell lymphoma of mucosa-associated lymphoid tissue [MALT-lymphoma]: Secondary | ICD-10-CM | POA: Diagnosis not present

## 2019-08-05 DIAGNOSIS — C884 Extranodal marginal zone B-cell lymphoma of mucosa-associated lymphoid tissue [MALT-lymphoma]: Secondary | ICD-10-CM | POA: Diagnosis not present

## 2019-08-08 DIAGNOSIS — C884 Extranodal marginal zone B-cell lymphoma of mucosa-associated lymphoid tissue [MALT-lymphoma]: Secondary | ICD-10-CM | POA: Diagnosis not present

## 2019-08-09 DIAGNOSIS — C884 Extranodal marginal zone B-cell lymphoma of mucosa-associated lymphoid tissue [MALT-lymphoma]: Secondary | ICD-10-CM | POA: Diagnosis not present

## 2019-08-10 DIAGNOSIS — C884 Extranodal marginal zone B-cell lymphoma of mucosa-associated lymphoid tissue [MALT-lymphoma]: Secondary | ICD-10-CM | POA: Diagnosis not present

## 2019-08-11 DIAGNOSIS — C884 Extranodal marginal zone B-cell lymphoma of mucosa-associated lymphoid tissue [MALT-lymphoma]: Secondary | ICD-10-CM | POA: Diagnosis not present

## 2019-08-12 DIAGNOSIS — C884 Extranodal marginal zone B-cell lymphoma of mucosa-associated lymphoid tissue [MALT-lymphoma]: Secondary | ICD-10-CM | POA: Diagnosis not present

## 2019-08-15 DIAGNOSIS — C884 Extranodal marginal zone B-cell lymphoma of mucosa-associated lymphoid tissue [MALT-lymphoma]: Secondary | ICD-10-CM | POA: Diagnosis not present

## 2019-08-16 DIAGNOSIS — C884 Extranodal marginal zone B-cell lymphoma of mucosa-associated lymphoid tissue [MALT-lymphoma]: Secondary | ICD-10-CM | POA: Diagnosis not present

## 2019-08-17 DIAGNOSIS — C884 Extranodal marginal zone B-cell lymphoma of mucosa-associated lymphoid tissue [MALT-lymphoma]: Secondary | ICD-10-CM | POA: Diagnosis not present

## 2019-08-18 DIAGNOSIS — C884 Extranodal marginal zone B-cell lymphoma of mucosa-associated lymphoid tissue [MALT-lymphoma]: Secondary | ICD-10-CM | POA: Diagnosis not present

## 2019-08-19 DIAGNOSIS — C884 Extranodal marginal zone B-cell lymphoma of mucosa-associated lymphoid tissue [MALT-lymphoma]: Secondary | ICD-10-CM | POA: Diagnosis not present

## 2019-08-22 DIAGNOSIS — C884 Extranodal marginal zone B-cell lymphoma of mucosa-associated lymphoid tissue [MALT-lymphoma]: Secondary | ICD-10-CM | POA: Diagnosis not present

## 2019-08-23 DIAGNOSIS — C884 Extranodal marginal zone B-cell lymphoma of mucosa-associated lymphoid tissue [MALT-lymphoma]: Secondary | ICD-10-CM | POA: Diagnosis not present

## 2019-09-15 DIAGNOSIS — H18413 Arcus senilis, bilateral: Secondary | ICD-10-CM | POA: Diagnosis not present

## 2019-09-15 DIAGNOSIS — Z79899 Other long term (current) drug therapy: Secondary | ICD-10-CM | POA: Diagnosis not present

## 2019-09-15 DIAGNOSIS — L930 Discoid lupus erythematosus: Secondary | ICD-10-CM | POA: Diagnosis not present

## 2019-09-15 DIAGNOSIS — H35372 Puckering of macula, left eye: Secondary | ICD-10-CM | POA: Diagnosis not present

## 2019-09-15 DIAGNOSIS — H2513 Age-related nuclear cataract, bilateral: Secondary | ICD-10-CM | POA: Diagnosis not present

## 2019-09-21 DIAGNOSIS — L589 Radiodermatitis, unspecified: Secondary | ICD-10-CM | POA: Insufficient documentation

## 2019-09-21 DIAGNOSIS — C884 Extranodal marginal zone B-cell lymphoma of mucosa-associated lymphoid tissue [MALT-lymphoma]: Secondary | ICD-10-CM | POA: Diagnosis not present

## 2019-09-27 ENCOUNTER — Ambulatory Visit: Payer: Medicare Other | Admitting: Family Medicine

## 2019-10-05 ENCOUNTER — Inpatient Hospital Stay (HOSPITAL_COMMUNITY): Payer: Medicare Other

## 2019-10-05 ENCOUNTER — Inpatient Hospital Stay (HOSPITAL_COMMUNITY): Payer: Medicare Other | Attending: Hematology | Admitting: Hematology

## 2019-10-05 ENCOUNTER — Other Ambulatory Visit: Payer: Self-pay

## 2019-10-05 VITALS — BP 117/80 | HR 95 | Temp 96.9°F | Resp 16 | Wt 206.8 lb

## 2019-10-05 DIAGNOSIS — C884 Extranodal marginal zone B-cell lymphoma of mucosa-associated lymphoid tissue [MALT-lymphoma]: Secondary | ICD-10-CM | POA: Insufficient documentation

## 2019-10-05 DIAGNOSIS — M069 Rheumatoid arthritis, unspecified: Secondary | ICD-10-CM | POA: Insufficient documentation

## 2019-10-05 LAB — CBC WITH DIFFERENTIAL/PLATELET
Abs Immature Granulocytes: 0.02 10*3/uL (ref 0.00–0.07)
Basophils Absolute: 0 10*3/uL (ref 0.0–0.1)
Basophils Relative: 1 %
Eosinophils Absolute: 0.2 10*3/uL (ref 0.0–0.5)
Eosinophils Relative: 3 %
HCT: 43.9 % (ref 36.0–46.0)
Hemoglobin: 13.8 g/dL (ref 12.0–15.0)
Immature Granulocytes: 0 %
Lymphocytes Relative: 28 %
Lymphs Abs: 2 10*3/uL (ref 0.7–4.0)
MCH: 27.5 pg (ref 26.0–34.0)
MCHC: 31.4 g/dL (ref 30.0–36.0)
MCV: 87.6 fL (ref 80.0–100.0)
Monocytes Absolute: 0.5 10*3/uL (ref 0.1–1.0)
Monocytes Relative: 7 %
Neutro Abs: 4.3 10*3/uL (ref 1.7–7.7)
Neutrophils Relative %: 61 %
Platelets: 281 10*3/uL (ref 150–400)
RBC: 5.01 MIL/uL (ref 3.87–5.11)
RDW: 14.8 % (ref 11.5–15.5)
WBC: 7.1 10*3/uL (ref 4.0–10.5)
nRBC: 0 % (ref 0.0–0.2)

## 2019-10-05 LAB — COMPREHENSIVE METABOLIC PANEL
ALT: 24 U/L (ref 0–44)
AST: 23 U/L (ref 15–41)
Albumin: 3.7 g/dL (ref 3.5–5.0)
Alkaline Phosphatase: 76 U/L (ref 38–126)
Anion gap: 9 (ref 5–15)
BUN: 16 mg/dL (ref 8–23)
CO2: 28 mmol/L (ref 22–32)
Calcium: 9.3 mg/dL (ref 8.9–10.3)
Chloride: 103 mmol/L (ref 98–111)
Creatinine, Ser: 1.02 mg/dL — ABNORMAL HIGH (ref 0.44–1.00)
GFR calc Af Amer: >60 mL/min (ref >60)
GFR calc non Af Amer: 57 mL/min — ABNORMAL LOW (ref >60)
Glucose, Bld: 84 mg/dL (ref 70–99)
Potassium: 4.8 mmol/L (ref 3.5–5.1)
Sodium: 140 mmol/L (ref 135–145)
Total Bilirubin: 0.5 mg/dL (ref 0.3–1.2)
Total Protein: 7.7 g/dL (ref 6.5–8.1)

## 2019-10-05 LAB — LACTATE DEHYDROGENASE: LDH: 123 U/L (ref 98–192)

## 2019-10-05 NOTE — Patient Instructions (Addendum)
[facility] at [facility] Discharge Instructions  You were seen today by Dr. Katragadda. He went over your recent lab results. He will see you back in 6 months for labs, scan and follow up.   Thank you for choosing [facility] at [facility] to provide your oncology and hematology care.  To afford each patient quality time with our provider, please arrive at least 15 minutes before your scheduled appointment time.   If you have a lab appointment with the Cancer Center please come in thru the  Main Entrance and check in at the main information desk  You need to re-schedule your appointment should you arrive 10 or more minutes late.  We strive to give you quality time with our providers, and arriving late affects you and other patients whose appointments are after yours.  Also, if you no show three or more times for appointments you may be dismissed from the clinic at the providers discretion.     Again, thank you for choosing [facility].  Our hope is that these requests will decrease the amount of time that you wait before being seen by our physicians.       _____________________________________________________________  Should you have questions after your visit to [facility], please contact our office at [phone] between the hours of 8:00 a.m. and 4:30 p.m.  Voicemails left after 4:00 p.m. will not be returned until the following business day.  For prescription refill requests, have your pharmacy contact our office and allow 72 hours.    Cancer Center Support Programs:   > Cancer Support Group  2nd Tuesday of the month 1pm-2pm, Journey Room    

## 2019-10-05 NOTE — Progress Notes (Signed)
[name], [place] [address]   CLINIC:  Medical Oncology/Hematology  PCP:  Fayrene Helper, MD [address] [place] [address] [phone]   REASON FOR VISIT:  Follow-up for non-Hodgkin's lymphoma.  CURRENT THERAPY: Observation.    INTERVAL HISTORY:  [name] 69 y.o. female seen for follow-up of non-Hodgkin's lymphoma. Denies any fevers, night sweats or weight loss. Numbness in the feet has been stable. Rheumatoid arthritis is well controlled on Plaquenil. Appetite is 100%. Energy levels are 75%.    REVIEW OF SYSTEMS:  Review of Systems  All other systems reviewed and are negative.    PAST MEDICAL/SURGICAL HISTORY:  Past Medical History:  Diagnosis Date  . Acute left-sided back pain with sciatica 11/27/2018  . Arthritis, rheumatoid ([place]) 1993   hx  . Collagen vascular disease ([facility])    RA  . Hyperlipidemia   . Hypertension   . Kidney stone 1999  . Obesity   . Osteoporosis 2019   intolerant of fosamax due to jaw pain  . Porphyria ([place])    hx    Past Surgical History:  Procedure Laterality Date  . APPENDECTOMY  1983  . LITHOTRIPSY  2000   rt renal stone      SOCIAL HISTORY:  Social History   Socioeconomic History  . Marital status: Single    Spouse name: Not on file  . Number of children: 0  . Years of education: 12+  . Highest education level: Doctorate  Occupational History  . Occupation: Retired; Teacher part time currently  Tobacco Use  . Smoking status: Current Every Day Smoker    Packs/day: 0.50    Types: Cigarettes  . Smokeless tobacco: Never Used  Substance and Sexual Activity  . Alcohol use: No  . Drug use: No  . Sexual activity: Not Currently  Other Topics Concern  . Not on file  Social History Narrative   Lives alone    Social Determinants of Health   Financial Resource Strain: Low Risk   . Difficulty of Paying Living Expenses: Not hard at all  Food Insecurity: No  Food Insecurity  . Worried About Charity fundraiser in the Last Year: Never true  . Ran Out of Food in the Last Year: Never true  Transportation Needs: No Transportation Needs  . Lack of Transportation (Medical): No  . Lack of Transportation (Non-Medical): No  Physical Activity: Insufficiently Active  . Days of Exercise per Week: 1 day  . Minutes of Exercise per Session: 60 min  Stress: Stress Concern Present  . Feeling of Stress : Very much  Social Connections: Slightly Isolated  . Frequency of Communication with Friends and Family: More than three times a week  . Frequency of Social Gatherings with Friends and Family: More than three times a week  . Attends Religious Services: More than 4 times per year  . Active Member of Clubs or Organizations: Yes  . Attends Archivist Meetings: More than 4 times per year  . Marital Status: Never married  Intimate Partner Violence: Not At Risk  . Fear of Current or Ex-Partner: No  . Emotionally Abused: No  . Physically Abused: No  . Sexually Abused: No    FAMILY HISTORY:  Family History  Problem Relation Age of Onset  . Arthritis Mother   . Hypertension Mother   . Hypertension Sister   . Arthritis Sister   . Hypertension Sister   . Arthritis  Sister   . Ulcerative colitis Sister   . Hypertension Sister   . Arthritis Sister   . Stroke Maternal Grandmother   . Arthritis Maternal Grandfather   . Arthritis Paternal Grandmother   . Stroke Paternal Grandfather     CURRENT MEDICATIONS:  Outpatient Encounter Medications as of 10/05/2019  Medication Sig  . acetaminophen (TYLENOL) 500 MG tablet Take 1,000 mg by mouth every 6 (six) hours as needed.  Marland Kitchen albuterol (PROVENTIL HFA;VENTOLIN HFA) 108 (90 Base) MCG/ACT inhaler Inhale 2 puffs into the lungs every 6 (six) hours as needed for wheezing or shortness of breath.  Marland Kitchen alendronate (FOSAMAX) 70 MG tablet Take 1 tablet (70 mg total) by mouth once a week. Take with a full glass of water  on an empty stomach.  . ALPRAZolam (XANAX) 0.25 MG tablet Take 1 tablet (0.25 mg total) by mouth at bedtime as needed for anxiety. (Patient not taking: Reported on 07/07/2019)  . aspirin (ASPIRIN LOW DOSE) 81 MG EC tablet Take 81 mg by mouth daily.    . Calcium Carbonate-Vitamin D (CALTRATE 600+D) 600-400 MG-UNIT per tablet Take 1 tablet by mouth 2 (two) times daily.    . Cholecalciferol (VITAMIN D3) 5000 units TABS Take 1 tablet by mouth daily.  Marland Kitchen co-enzyme Q-10 30 MG capsule Take 30 mg by mouth 3 (three) times daily.  Marland Kitchen EPINEPHrine 0.3 mg/0.3 mL IJ SOAJ injection Inject 0.3 mLs (0.3 mg total) into the muscle as needed for anaphylaxis. (Patient not taking: Reported on 07/07/2019)  . fluticasone (FLONASE) 50 MCG/ACT nasal spray Place 2 sprays into both nostrils daily.  Marland Kitchen gabapentin (NEURONTIN) 100 MG capsule Take 1 capsule (100 mg total) by mouth at bedtime.  . hydroxychloroquine (PLAQUENIL) 200 MG tablet Take 400 mg by mouth daily.   . hydrOXYzine (ATARAX/VISTARIL) 50 MG tablet One tablet at bedtime, as needed,  for sleep and anxiety (Patient not taking: Reported on 07/07/2019)  . loratadine (CLARITIN) 10 MG tablet Take 10 mg by mouth daily.  Marland Kitchen losartan (COZAAR) 50 MG tablet Take 1 tablet (50 mg total) by mouth daily.  . montelukast (SINGULAIR) 10 MG tablet Take 1 tablet (10 mg total) by mouth at bedtime.  . Multiple Vitamins-Minerals (CENTRUM SILVER PO) Take by mouth daily.    . Omega-3 Fatty Acids (FISH OIL) 1000 MG CAPS Take 1,000 capsules by mouth daily.   Marland Kitchen omeprazole (PRILOSEC) 20 MG capsule Take 20 mg by mouth daily.  . pravastatin (PRAVACHOL) 10 MG tablet TAKE 1 TABLET DAILY STOP   TAKING CRESTOR  . Turmeric (QC TUMERIC COMPLEX) 500 MG CAPS Take 1 capsule by mouth.  . vitamin B-12 (CYANOCOBALAMIN) 50 MCG tablet Take 50 mcg by mouth daily.  . Zinc 50 MG TABS Take 50 mg by mouth daily.  . [DISCONTINUED] buPROPion (ZYBAN) 150 MG 12 hr tablet Take 1 tablet (150 mg total) by mouth 2 (two) times  daily. (Patient not taking: Reported on 10/05/2019)  . [DISCONTINUED] Calcium Carbonate-Vit D-Min (CALCIUM 600+D PLUS MINERALS) 600-400 MG-UNIT TABS Take by mouth.   No facility-administered encounter medications on file as of 10/05/2019.    ALLERGIES:  Allergies  Allergen Reactions  . Septra [Sulfamethoxazole-Trimethoprim] Dermatitis  . Barbiturates Other (See Comments)  . Cefuroxime Axetil   . Cefuroxime Axetil Other (See Comments)  . Chlordiazepoxide Other (See Comments)  . Hydrocodone-Acetaminophen   . Hydrocodone-Acetaminophen Hives and Other (See Comments)  . Phenytoin Other (See Comments)  . Rofecoxib Other (See Comments)     PHYSICAL EXAM:  ECOG  Performance status: 1  Vitals:   10/05/19 1514  BP: 117/80  Pulse: 95  Resp: 16  Temp: (!) 96.9 F (36.1 C)  SpO2: 100%   Filed Weights   10/05/19 1514  Weight: 206 lb 12.8 oz (93.8 kg)    Physical Exam Vitals reviewed.  Constitutional:      Appearance: Normal appearance.  Cardiovascular:     Rate and Rhythm: Normal rate and regular rhythm.     Heart sounds: Normal heart sounds.  Pulmonary:     Effort: Pulmonary effort is normal.     Breath sounds: Normal breath sounds.  Abdominal:     General: There is no distension.     Palpations: Abdomen is soft. There is no mass.  Skin:    General: Skin is warm.  Neurological:     General: No focal deficit present.     Mental Status: She is alert and oriented to person, place, and time.  Psychiatric:        Mood and Affect: Mood normal.        Behavior: Behavior normal.      LABORATORY DATA:  I have reviewed the labs as listed.  CBC    Component Value Date/Time   WBC 7.1 10/05/2019 1427   RBC 5.01 10/05/2019 1427   HGB 13.8 10/05/2019 1427   HCT 43.9 10/05/2019 1427   PLT 281 10/05/2019 1427   MCV 87.6 10/05/2019 1427   MCH 27.5 10/05/2019 1427   MCHC 31.4 10/05/2019 1427   RDW 14.8 10/05/2019 1427   LYMPHSABS 2.0 10/05/2019 1427   MONOABS 0.5 10/05/2019  1427   EOSABS 0.2 10/05/2019 1427   BASOSABS 0.0 10/05/2019 1427   CMP Latest Ref Rng & Units 10/05/2019 07/28/2019 06/07/2019  Glucose 70 - 99 mg/dL 84 - 95  BUN 8 - 23 mg/dL 16 - 17  Creatinine 0.44 - 1.00 mg/dL 1.02(H) 1.00 0.79  Sodium 135 - 145 mmol/L 140 - 137  Potassium 3.5 - 5.1 mmol/L 4.8 - 4.2  Chloride 98 - 111 mmol/L 103 - 103  CO2 22 - 32 mmol/L 28 - 26  Calcium 8.9 - 10.3 mg/dL 9.3 - 9.4  Total Protein 6.5 - 8.1 g/dL 7.7 - 8.0  Total Bilirubin 0.3 - 1.2 mg/dL 0.5 - 0.6  Alkaline Phos 38 - 126 U/L 76 - 61  AST 15 - 41 U/L 23 - 21  ALT 0 - 44 U/L 24 - 20       DIAGNOSTIC IMAGING:  I have independently reviewed scans.    ASSESSMENT & PLAN:   MALT lymphoma ([place]) 1.  Clinical stage III MALT lymphoma: -Mammogram on May 12, 2019 showed left breast mass. -Biopsy consistent with non-Hodgkin's lymphoma, specifically extranodal marginal zone lymphoma of MALT with plasmacytic differentiation. -She did not have any B symptoms. PET scan on 06/17/2019 showed mild hypermetabolic area in the left breast at the site of known lymphoma. Intense FDG uptake within the retrobulbar and periorbital soft tissues without signs of visible soft tissue mass or lesion. Mildly hypermetabolic lymph nodes in the external iliac chains bilaterally. Asymmetry of the tonsillar tissue with increased activity on the left side. MRI of the orbits did not show any mass. -She completed radiation therapy to the left breast mass end of March 2021. -Today physical exam did not reveal any palpable masses. No B symptoms. Labs were reviewed by me are within normal limits with normal LDH. -I will schedule her for PET scan 6 months from finishing radiation.  We will also follow-up on the external iliac chain lymph nodes bilaterally.  2. Rheumatoid arthritis: -She was diagnosed in her 51s. Currently on Plaquenil.        Orders placed this encounter:  Orders Placed This Encounter  Procedures  . NM PET Image  Restag (PS) Skull Base To Thigh    Derek Jack, MD [place] [phone]

## 2019-10-05 NOTE — Assessment & Plan Note (Signed)
1.  Clinical stage III MALT lymphoma: -Mammogram on May 12, 2019 showed left breast mass. -Biopsy consistent with non-Hodgkin's lymphoma, specifically extranodal marginal zone lymphoma of MALT with plasmacytic differentiation. -She did not have any B symptoms. PET scan on 06/17/2019 showed mild hypermetabolic area in the left breast at the site of known lymphoma. Intense FDG uptake within the retrobulbar and periorbital soft tissues without signs of visible soft tissue mass or lesion. Mildly hypermetabolic lymph nodes in the external iliac chains bilaterally. Asymmetry of the tonsillar tissue with increased activity on the left side. MRI of the orbits did not show any mass. -She completed radiation therapy to the left breast mass end of March 2021. -Today physical exam did not reveal any palpable masses. No B symptoms. Labs were reviewed by me are within normal limits with normal LDH. -I will schedule her for PET scan 6 months from finishing radiation. We will also follow-up on the external iliac chain lymph nodes bilaterally.  2. Rheumatoid arthritis: -She was diagnosed in her 1s. Currently on Plaquenil.

## 2019-10-06 ENCOUNTER — Other Ambulatory Visit (HOSPITAL_COMMUNITY): Payer: Self-pay | Admitting: *Deleted

## 2019-10-06 DIAGNOSIS — C884 Extranodal marginal zone B-cell lymphoma of mucosa-associated lymphoid tissue [MALT-lymphoma]: Secondary | ICD-10-CM

## 2019-10-13 ENCOUNTER — Other Ambulatory Visit: Payer: Self-pay | Admitting: Family Medicine

## 2019-11-14 DIAGNOSIS — M0579 Rheumatoid arthritis with rheumatoid factor of multiple sites without organ or systems involvement: Secondary | ICD-10-CM | POA: Diagnosis not present

## 2019-11-14 DIAGNOSIS — Z79899 Other long term (current) drug therapy: Secondary | ICD-10-CM | POA: Diagnosis not present

## 2019-11-14 DIAGNOSIS — M2550 Pain in unspecified joint: Secondary | ICD-10-CM | POA: Diagnosis not present

## 2019-11-14 DIAGNOSIS — H209 Unspecified iridocyclitis: Secondary | ICD-10-CM | POA: Diagnosis not present

## 2019-11-14 DIAGNOSIS — M150 Primary generalized (osteo)arthritis: Secondary | ICD-10-CM | POA: Diagnosis not present

## 2020-01-02 ENCOUNTER — Encounter: Payer: Self-pay | Admitting: Family Medicine

## 2020-01-02 ENCOUNTER — Other Ambulatory Visit: Payer: Self-pay

## 2020-01-02 MED ORDER — GABAPENTIN 100 MG PO CAPS: 100.0000 mg | ORAL_CAPSULE | Freq: Every day | ORAL | 0 refills | Status: DC

## 2020-01-02 MED ORDER — LOSARTAN POTASSIUM 50 MG PO TABS: 50.0000 mg | ORAL_TABLET | Freq: Every day | ORAL | 0 refills | Status: DC

## 2020-01-18 ENCOUNTER — Encounter: Payer: Self-pay | Admitting: Family Medicine

## 2020-01-18 ENCOUNTER — Other Ambulatory Visit: Payer: Self-pay | Admitting: *Deleted

## 2020-01-18 MED ORDER — ALENDRONATE SODIUM 70 MG PO TABS: ORAL_TABLET | ORAL | 0 refills | Status: DC

## 2020-01-25 DIAGNOSIS — C8599 Non-Hodgkin lymphoma, unspecified, extranodal and solid organ sites: Secondary | ICD-10-CM | POA: Diagnosis not present

## 2020-01-25 DIAGNOSIS — C884 Extranodal marginal zone B-cell lymphoma of mucosa-associated lymphoid tissue [MALT-lymphoma]: Secondary | ICD-10-CM | POA: Diagnosis not present

## 2020-02-14 ENCOUNTER — Other Ambulatory Visit: Payer: Self-pay

## 2020-02-14 ENCOUNTER — Encounter: Payer: Self-pay | Admitting: Family Medicine

## 2020-02-14 ENCOUNTER — Ambulatory Visit (INDEPENDENT_AMBULATORY_CARE_PROVIDER_SITE_OTHER): Payer: Medicare Other | Admitting: Family Medicine

## 2020-02-14 VITALS — BP 120/80 | HR 81 | Resp 16 | Ht 67.0 in | Wt 212.0 lb

## 2020-02-14 DIAGNOSIS — F17210 Nicotine dependence, cigarettes, uncomplicated: Secondary | ICD-10-CM

## 2020-02-14 DIAGNOSIS — Z1231 Encounter for screening mammogram for malignant neoplasm of breast: Secondary | ICD-10-CM

## 2020-02-14 DIAGNOSIS — F409 Phobic anxiety disorder, unspecified: Secondary | ICD-10-CM

## 2020-02-14 DIAGNOSIS — R252 Cramp and spasm: Secondary | ICD-10-CM

## 2020-02-14 DIAGNOSIS — F5105 Insomnia due to other mental disorder: Secondary | ICD-10-CM

## 2020-02-14 DIAGNOSIS — R7303 Prediabetes: Secondary | ICD-10-CM

## 2020-02-14 DIAGNOSIS — Z0000 Encounter for general adult medical examination without abnormal findings: Secondary | ICD-10-CM

## 2020-02-14 DIAGNOSIS — E559 Vitamin D deficiency, unspecified: Secondary | ICD-10-CM

## 2020-02-14 DIAGNOSIS — I10 Essential (primary) hypertension: Secondary | ICD-10-CM

## 2020-02-14 DIAGNOSIS — E785 Hyperlipidemia, unspecified: Secondary | ICD-10-CM

## 2020-02-14 DIAGNOSIS — Z1211 Encounter for screening for malignant neoplasm of colon: Secondary | ICD-10-CM

## 2020-02-14 MED ORDER — LOSARTAN POTASSIUM 50 MG PO TABS
50.0000 mg | ORAL_TABLET | Freq: Every day | ORAL | 3 refills | Status: DC
Start: 2020-02-14 — End: 2021-03-22

## 2020-02-14 MED ORDER — MONTELUKAST SODIUM 10 MG PO TABS
10.0000 mg | ORAL_TABLET | Freq: Every day | ORAL | 3 refills | Status: DC
Start: 2020-02-14 — End: 2021-03-22

## 2020-02-14 MED ORDER — PRAVASTATIN SODIUM 10 MG PO TABS: ORAL_TABLET | ORAL | 3 refills | Status: DC

## 2020-02-14 MED ORDER — GABAPENTIN 100 MG PO CAPS: 100.0000 mg | ORAL_CAPSULE | Freq: Two times a day (BID) | ORAL | 2 refills | Status: DC

## 2020-02-14 MED ORDER — ALENDRONATE SODIUM 70 MG PO TABS: ORAL_TABLET | ORAL | 0 refills | Status: DC

## 2020-02-14 NOTE — Patient Instructions (Signed)
F/U in office with MD in 5.5 months, call if you need me sooner  Fasting lipid, cmp and eGFr, Magnesium and vit D with next lab draw for Oncology  In November  Please schedule December mammogram at [place] screen by nurse today  You will be referred for colonoscopy later this year as  Discussed  Now smoking 2 cigarettes/ day, which is an improvement, hopefully the plan and hope to quit by year end will be realized  Gabapentin dose is increased to one twice daily, let me know if this helps with cramps, and no need to add magnesium if it does  It is important that you exercise regularly at least 30 minutes 5 times a week. If you develop chest pain, have severe difficulty breathing, or feel very tired, stop exercising immediately and seek medical attention  Think about what you will eat, plan ahead. Choose " clean, green, fresh or frozen" over canned, processed or packaged foods which are more sugary, salty and fatty. 70 to 75% of food eaten should be vegetables and fruit. Three meals at set times with snacks allowed between meals, but they must be fruit or vegetables. Aim to eat over a 12 hour period , example 7 am to 7 pm, and STOP after  your last meal of the day. Drink water,generally about 64 ounces per day, no other drink is as healthy. Fruit juice is best enjoyed in a healthy way, by EATING the fruit. Thanks for choosing [facility], we consider it a privelige to serve you.

## 2020-02-14 NOTE — Assessment & Plan Note (Signed)

## 2020-02-15 ENCOUNTER — Encounter: Payer: Self-pay | Admitting: Family Medicine

## 2020-02-15 ENCOUNTER — Encounter (INDEPENDENT_AMBULATORY_CARE_PROVIDER_SITE_OTHER): Payer: Self-pay | Admitting: *Deleted

## 2020-02-15 NOTE — Progress Notes (Signed)
    [name]     MRN: [id number]      DOB: [birth year]  HPI: Patient is in for annual physical exam. No other health concerns are expressed or addressed at the visit. Recent labs, if available are reviewed. Immunization is reviewed , and  updated if needed.   PE: BP 120/80   Pulse 81   Resp 16   Ht 5\' 7"  (1.702 m)   Wt 212 lb (96.2 kg)   SpO2 95%   BMI 33.20 kg/m   Pleasant  female, alert and oriented x 3, in no cardio-pulmonary distress. Afebrile. HEENT No facial trauma or asymetry. Sinuses non tender.  Extra occullar muscles intact.. External ears normal, . Neck: supple, no adenopathy,JVD or thyromegaly.No bruits.  Chest: Clear to ascultation bilaterally.No crackles or wheezes. Non tender to palpation  Breast: asymetry,no masses or lumps. No tenderness. No nipple discharge or inversion.s/p left lumpectomy No axillary or supraclavicular adenopathy  Cardiovascular system; Heart sounds normal,  S1 and  S2 ,no S3.  No murmur, or thrill. Apical beat not displaced Peripheral pulses normal.  Abdomen: Soft, non tender, no organomegaly or masses. No bruits. Bowel sounds normal. No guarding, tenderness or rebound.    Musculoskeletal exam: Full ROM of spine, hips , shoulders and knees. No deformity ,swelling or crepitus noted. No muscle wasting or atrophy.   Neurologic: Cranial nerves 2 to 12 intact. Power, tone ,sensation  normal throughout. No disturbance in gait. No tremor.  Skin: Intact, no ulceration, erythema , scaling or rash noted. Pigmentation normal throughout  Psych; Normal mood and affect. Judgement and concentration normal   Assessment & Plan:  Annual physical exam Annual exam as documented. Counseling done  re healthy lifestyle involving commitment to 150 minutes exercise per week, heart healthy diet, and attaining healthy weight.The importance of adequate sleep also discussed. Regular seat belt use and home safety, is also  discussed. Changes in health habits are decided on by the patient with goals and time frames  set for achieving them. Immunization and cancer screening needs are specifically addressed at this visit.   Cigarette smoker Asked:confirms currently smokes cigarettes 2 to 5 cigarettes per day, mainly for stress relief Assess: Unwilling to set a quit date, but is cutting back Advise: needs to QUIT to reduce risk of cancer, cardio and cerebrovascular disease Assist: counseled for 5 minutes and literature provided Arrange: follow up in 2 to 6 months   Insomnia due to anxiety and fear Controlled, no change in medication

## 2020-02-15 NOTE — Assessment & Plan Note (Signed)
Asked:confirms currently smokes cigarettes 2 to 5 cigarettes per day, mainly for stress relief Assess: Unwilling to set a quit date, but is cutting back Advise: needs to QUIT to reduce risk of cancer, cardio and cerebrovascular disease Assist: counseled for 5 minutes and literature provided Arrange: follow up in 2 to 6 months

## 2020-02-15 NOTE — Assessment & Plan Note (Signed)
Controlled, no change in medication  

## 2020-02-29 ENCOUNTER — Telehealth (INDEPENDENT_AMBULATORY_CARE_PROVIDER_SITE_OTHER): Payer: Self-pay | Admitting: Internal Medicine

## 2020-02-29 NOTE — Telephone Encounter (Signed)
I talked with K[place]ston over the phone. Is scheduled for PET scan later this month. He would like to schedule screening colonoscopy in second or third week of November 2021. He has received a letter from Ms. Lacretia Nicks.

## 2020-03-06 ENCOUNTER — Encounter (HOSPITAL_COMMUNITY)
Admission: RE | Admit: 2020-03-06 | Discharge: 2020-03-06 | Disposition: A | Discharge status: Home / Self Care | Payer: Medicare Other | Source: Ambulatory Visit | Attending: Hematology | Admitting: Hematology

## 2020-03-06 ENCOUNTER — Other Ambulatory Visit: Payer: Self-pay

## 2020-03-06 DIAGNOSIS — K449 Diaphragmatic hernia without obstruction or gangrene: Secondary | ICD-10-CM | POA: Diagnosis not present

## 2020-03-06 DIAGNOSIS — I700 Atherosclerosis of aorta: Secondary | ICD-10-CM | POA: Insufficient documentation

## 2020-03-06 DIAGNOSIS — C884 Extranodal marginal zone B-cell lymphoma of mucosa-associated lymphoid tissue [MALT-lymphoma]: Secondary | ICD-10-CM | POA: Insufficient documentation

## 2020-03-06 DIAGNOSIS — I2510 Atherosclerotic heart disease of native coronary artery without angina pectoris: Secondary | ICD-10-CM | POA: Diagnosis not present

## 2020-03-06 DIAGNOSIS — K5730 Diverticulosis of large intestine without perforation or abscess without bleeding: Secondary | ICD-10-CM | POA: Insufficient documentation

## 2020-03-06 DIAGNOSIS — J439 Emphysema, unspecified: Secondary | ICD-10-CM | POA: Insufficient documentation

## 2020-03-06 LAB — GLUCOSE, CAPILLARY: Glucose-Capillary: 94 mg/dL (ref 70–99)

## 2020-03-06 MED ORDER — FLUDEOXYGLUCOSE F - 18 (FDG) INJECTION
10.5000 | Freq: Once | INTRAVENOUS | Status: AC | PRN
  Administered 2020-03-06: 10.5 via INTRAVENOUS

## 2020-03-27 ENCOUNTER — Inpatient Hospital Stay (HOSPITAL_COMMUNITY): Payer: Medicare Other | Attending: Hematology

## 2020-03-27 ENCOUNTER — Other Ambulatory Visit: Payer: Self-pay

## 2020-03-27 ENCOUNTER — Inpatient Hospital Stay (HOSPITAL_BASED_OUTPATIENT_CLINIC_OR_DEPARTMENT_OTHER): Payer: Medicare Other | Admitting: Hematology

## 2020-03-27 ENCOUNTER — Encounter (HOSPITAL_COMMUNITY): Payer: Self-pay | Admitting: Hematology

## 2020-03-27 ENCOUNTER — Other Ambulatory Visit (HOSPITAL_COMMUNITY)
Admission: RE | Admit: 2020-03-27 | Discharge: 2020-03-27 | Disposition: A | Discharge status: Home / Self Care | Payer: Medicare Other | Source: Ambulatory Visit | Attending: Family Medicine | Admitting: Family Medicine

## 2020-03-27 VITALS — BP 130/80 | HR 91 | Temp 97.1°F | Resp 20 | Wt 211.4 lb

## 2020-03-27 DIAGNOSIS — C884 Extranodal marginal zone B-cell lymphoma of mucosa-associated lymphoid tissue [MALT-lymphoma]: Secondary | ICD-10-CM | POA: Diagnosis not present

## 2020-03-27 DIAGNOSIS — M069 Rheumatoid arthritis, unspecified: Secondary | ICD-10-CM | POA: Insufficient documentation

## 2020-03-27 DIAGNOSIS — R252 Cramp and spasm: Secondary | ICD-10-CM | POA: Insufficient documentation

## 2020-03-27 DIAGNOSIS — I10 Essential (primary) hypertension: Secondary | ICD-10-CM | POA: Insufficient documentation

## 2020-03-27 DIAGNOSIS — E559 Vitamin D deficiency, unspecified: Secondary | ICD-10-CM | POA: Insufficient documentation

## 2020-03-27 DIAGNOSIS — E785 Hyperlipidemia, unspecified: Secondary | ICD-10-CM | POA: Insufficient documentation

## 2020-03-27 LAB — COMPREHENSIVE METABOLIC PANEL
ALT: 19 U/L (ref 0–44)
AST: 22 U/L (ref 15–41)
Albumin: 4.1 g/dL (ref 3.5–5.0)
Alkaline Phosphatase: 57 U/L (ref 38–126)
Anion gap: 9 (ref 5–15)
BUN: 17 mg/dL (ref 8–23)
CO2: 25 mmol/L (ref 22–32)
Calcium: 9.5 mg/dL (ref 8.9–10.3)
Chloride: 102 mmol/L (ref 98–111)
Creatinine, Ser: 0.93 mg/dL (ref 0.44–1.00)
GFR, Estimated: >60 mL/min (ref >60)
Glucose, Bld: 100 mg/dL — ABNORMAL HIGH (ref 70–99)
Potassium: 3.9 mmol/L (ref 3.5–5.1)
Sodium: 136 mmol/L (ref 135–145)
Total Bilirubin: 0.4 mg/dL (ref 0.3–1.2)
Total Protein: 7.8 g/dL (ref 6.5–8.1)

## 2020-03-27 LAB — CBC WITH DIFFERENTIAL/PLATELET
Abs Immature Granulocytes: 0.02 10*3/uL (ref 0.00–0.07)
Basophils Absolute: 0 10*3/uL (ref 0.0–0.1)
Basophils Relative: 1 %
Eosinophils Absolute: 0.2 10*3/uL (ref 0.0–0.5)
Eosinophils Relative: 2 %
HCT: 41.2 % (ref 36.0–46.0)
Hemoglobin: 13.1 g/dL (ref 12.0–15.0)
Immature Granulocytes: 0 %
Lymphocytes Relative: 25 %
Lymphs Abs: 2.2 10*3/uL (ref 0.7–4.0)
MCH: 27.9 pg (ref 26.0–34.0)
MCHC: 31.8 g/dL (ref 30.0–36.0)
MCV: 87.7 fL (ref 80.0–100.0)
Monocytes Absolute: 0.6 10*3/uL (ref 0.1–1.0)
Monocytes Relative: 7 %
Neutro Abs: 5.6 10*3/uL (ref 1.7–7.7)
Neutrophils Relative %: 65 %
Platelets: 240 10*3/uL (ref 150–400)
RBC: 4.7 MIL/uL (ref 3.87–5.11)
RDW: 15.5 % (ref 11.5–15.5)
WBC: 8.6 10*3/uL (ref 4.0–10.5)
nRBC: 0 % (ref 0.0–0.2)

## 2020-03-27 LAB — LIPID PANEL
Cholesterol: 200 mg/dL (ref 0–200)
HDL: 64 mg/dL (ref >40)
LDL Cholesterol: 114 mg/dL — ABNORMAL HIGH (ref 0–99)
Total CHOL/HDL Ratio: 3.1 RATIO
Triglycerides: 109 mg/dL (ref <150)
VLDL: 22 mg/dL (ref 0–40)

## 2020-03-27 LAB — MAGNESIUM: Magnesium: 2 mg/dL (ref 1.7–2.4)

## 2020-03-27 LAB — LACTATE DEHYDROGENASE: LDH: 150 U/L (ref 98–192)

## 2020-03-27 LAB — VITAMIN D 25 HYDROXY (VIT D DEFICIENCY, FRACTURES): Vit D, 25-Hydroxy: 49.15 ng/mL (ref 30–100)

## 2020-03-27 NOTE — Patient Instructions (Signed)
[facility] Discharge Instructions  You were seen today by Dr. Katragadda. He went over your recent results and scans. Dr. Katragadda will see you back in 6 months for labs and follow up.   Thank you for choosing [facility] to provide your oncology and hematology care.  To afford each patient quality time with our provider, please arrive at least 15 minutes before your scheduled appointment time.   If you have a lab appointment with the Cancer Center please come in thru the Main Entrance and check in at the main information desk  You need to re-schedule your appointment should you arrive 10 or more minutes late.  We strive to give you quality time with our providers, and arriving late affects you and other patients whose appointments are after yours.  Also, if you no show three or more times for appointments you may be dismissed from the clinic at the providers discretion.     Again, thank you for choosing [facility].  Our hope is that these requests will decrease the amount of time that you wait before being seen by our physicians.       _____________________________________________________________  Should you have questions after your visit to  Cancer Center, please contact our office at [phone] between the hours of 8:00 a.m. and 4:30 p.m.  Voicemails left after 4:00 p.m. will not be returned until the following business day.  For prescription refill requests, have your pharmacy contact our office and allow 72 hours.    Cancer Center Support Programs:   > Cancer Support Group  2nd Tuesday of the month 1pm-2pm, Journey Room   

## 2020-03-27 NOTE — Progress Notes (Signed)
[name], [place] [id number]   CLINIC:  Medical Oncology/Hematology  PCP:  Fayrene Helper, MD [address], [place] / [place] [address]  [phone]  REASON FOR VISIT:  Follow-up for MALT lymphoma  PRIOR THERAPY: None  CURRENT THERAPY: Observation  INTERVAL HISTORY:  [name], a 69 y.o. female, returns for routine follow-up for her MALT lymphoma. [name] was last seen on 10/05/2019.  Today she reports feeling well. She reports having left shoulder pain due to her RA and exacerbated with cold weather. She denies F/C, night sweats, or unexplained weight loss. Her biggest complaint is her feet cramping up at night and when walking.  She is scheduled for a colonoscopy in November. Her mammogram is scheduled for 12/20. She will see Dr. Adella Nissen in January. She has received her COVID booster shot.   REVIEW OF SYSTEMS:  Review of Systems  Constitutional: Positive for fatigue (80%). Negative for appetite change, chills, diaphoresis, fever and unexpected weight change.  All other systems reviewed and are negative.   PAST MEDICAL/SURGICAL HISTORY:  Past Medical History:  Diagnosis Date  . Acute left-sided back pain with sciatica 11/27/2018  . Arthritis, rheumatoid ([place]) 1993   hx  . Collagen vascular disease ([facility])    RA  . Hyperlipidemia   . Hypertension   . Kidney stone 1999  . Obesity   . Osteoporosis 2019   intolerant of fosamax due to jaw pain  . Porphyria ([place])    hx    Past Surgical History:  Procedure Laterality Date  . APPENDECTOMY  1983  . LITHOTRIPSY  2000   rt renal stone     SOCIAL HISTORY:  Social History   Socioeconomic History  . Marital status: Single    Spouse name: Not on file  . Number of children: 0  . Years of education: 12+  . Highest education level: Doctorate  Occupational History  . Occupation: Retired; Teacher part time currently  Tobacco Use  . Smoking status: Current Some Day  Smoker    Packs/day: 0.50    Types: Cigarettes  . Smokeless tobacco: Never Used  . Tobacco comment: only has one occasionally  Vaping Use  . Vaping Use: Never used  Substance and Sexual Activity  . Alcohol use: No  . Drug use: No  . Sexual activity: Not Currently  Other Topics Concern  . Not on file  Social History Narrative   Lives alone    Social Determinants of Health   Financial Resource Strain: Low Risk   . Difficulty of Paying Living Expenses: Not hard at all  Food Insecurity: No Food Insecurity  . Worried About Charity fundraiser in the Last Year: Never true  . Ran Out of Food in the Last Year: Never true  Transportation Needs: No Transportation Needs  . Lack of Transportation (Medical): No  . Lack of Transportation (Non-Medical): No  Physical Activity: Insufficiently Active  . Days of Exercise per Week: 3 days  . Minutes of Exercise per Session: 40 min  Stress: No Stress Concern Present  . Feeling of Stress : Only a little  Social Connections: Moderately Isolated  . Frequency of Communication with Friends and Family: More than three times a week  . Frequency of Social Gatherings with Friends and Family: Twice a week  . Attends Religious Services: 1 to 4 times per year  . Active Member of Clubs or Organizations: No  . Attends Club  or Organization Meetings: Never  . Marital Status: Never married  Intimate Partner Violence: Not At Risk  . Fear of Current or Ex-Partner: No  . Emotionally Abused: No  . Physically Abused: No  . Sexually Abused: No    FAMILY HISTORY:  Family History  Problem Relation Age of Onset  . Arthritis Mother   . Hypertension Mother   . Hypertension Sister   . Arthritis Sister   . Hypertension Sister   . Arthritis Sister   . Ulcerative colitis Sister   . Hypertension Sister   . Arthritis Sister   . Stroke Maternal Grandmother   . Arthritis Maternal Grandfather   . Arthritis Paternal Grandmother   . Stroke Paternal Grandfather      CURRENT MEDICATIONS:  Current Outpatient Medications  Medication Sig Dispense Refill  . acetaminophen (TYLENOL) 500 MG tablet Take 1,000 mg by mouth every 6 (six) hours as needed.    Marland Kitchen albuterol (PROVENTIL HFA;VENTOLIN HFA) 108 (90 Base) MCG/ACT inhaler Inhale 2 puffs into the lungs every 6 (six) hours as needed for wheezing or shortness of breath. 1 Inhaler 0  . alendronate (FOSAMAX) 70 MG tablet TAKE 1 TABLET BY MOUTH ONCE A WEEK. TAKE WITH A FULL GLASS OF WATER ON AN EMPTY STOMACH. 12 tablet 0  . aspirin (ASPIRIN LOW DOSE) 81 MG EC tablet Take 81 mg by mouth daily.      . Calcium Carbonate-Vitamin D (CALTRATE 600+D) 600-400 MG-UNIT per tablet Take 1 tablet by mouth 2 (two) times daily.      . Cholecalciferol (VITAMIN D3) 5000 units TABS Take 1 tablet by mouth daily.    Marland Kitchen co-enzyme Q-10 30 MG capsule Take 30 mg by mouth 3 (three) times daily.    Marland Kitchen EPINEPHrine 0.3 mg/0.3 mL IJ SOAJ injection Inject 0.3 mLs (0.3 mg total) into the muscle as needed for anaphylaxis. 1 each 2  . fluticasone (FLONASE) 50 MCG/ACT nasal spray Place 2 sprays into both nostrils daily. 48 g 1  . gabapentin (NEURONTIN) 100 MG capsule Take 1 capsule (100 mg total) by mouth 2 (two) times daily. 180 capsule 2  . hydroxychloroquine (PLAQUENIL) 200 MG tablet Take 400 mg by mouth daily.     Marland Kitchen loratadine (CLARITIN) 10 MG tablet Take 10 mg by mouth daily.    Marland Kitchen losartan (COZAAR) 50 MG tablet Take 1 tablet (50 mg total) by mouth daily. 90 tablet 3  . montelukast (SINGULAIR) 10 MG tablet Take 1 tablet (10 mg total) by mouth at bedtime. 90 tablet 3  . Multiple Vitamins-Minerals (CENTRUM SILVER PO) Take by mouth daily.      . Omega-3 Fatty Acids (FISH OIL) 1000 MG CAPS Take 1,000 capsules by mouth daily.     Marland Kitchen omeprazole (PRILOSEC) 20 MG capsule Take 20 mg by mouth daily.    . pravastatin (PRAVACHOL) 10 MG tablet TAKE 1 TABLET DAILY STOP   TAKING CRESTOR 90 tablet 3  . Turmeric (QC TUMERIC COMPLEX) 500 MG CAPS Take 1 capsule by  mouth.    . vitamin B-12 (CYANOCOBALAMIN) 50 MCG tablet Take 50 mcg by mouth daily.    . Zinc 50 MG TABS Take 50 mg by mouth daily.     No current facility-administered medications for this visit.    ALLERGIES:  Allergies  Allergen Reactions  . Septra [Sulfamethoxazole-Trimethoprim] Dermatitis  . Barbiturates Other (See Comments)  . Cefuroxime Axetil   . Cefuroxime Axetil Other (See Comments)  . Chlordiazepoxide Other (See Comments)  . Hydrocodone-Acetaminophen   .  Hydrocodone-Acetaminophen Hives and Other (See Comments)  . Pentothal [Thiopental]     Hives?  . Phenytoin Other (See Comments)  . Rofecoxib Other (See Comments)  . Wellbutrin [Bupropion] Palpitations    PHYSICAL EXAM:  Performance status (ECOG): 1 - Symptomatic but completely ambulatory  Vitals:   03/27/20 1441  BP: 130/80  Pulse: 91  Resp: 20  Temp: (!) 97.1 F (36.2 C)  SpO2: 100%   Wt Readings from Last 3 Encounters:  03/27/20 211 lb 6.4 oz (95.9 kg)  02/14/20 212 lb (96.2 kg)  10/05/19 206 lb 12.8 oz (93.8 kg)   Physical Exam Vitals reviewed.  Constitutional:      Appearance: Normal appearance. She is obese.  Cardiovascular:     Rate and Rhythm: Normal rate and regular rhythm.     Pulses: Normal pulses.     Heart sounds: Normal heart sounds.  Pulmonary:     Effort: Pulmonary effort is normal.     Breath sounds: Normal breath sounds.  Abdominal:     Palpations: Abdomen is soft. There is no hepatomegaly, splenomegaly or mass.     Tenderness: There is no abdominal tenderness.     Hernia: No hernia is present.  Lymphadenopathy:     Upper Body:     Right upper body: No supraclavicular, axillary or pectoral adenopathy.     Left upper body: No supraclavicular, axillary or pectoral adenopathy.     Lower Body: No right inguinal adenopathy. No left inguinal adenopathy.  Neurological:     General: No focal deficit present.     Mental Status: She is alert and oriented to person, place, and time.   Psychiatric:        Mood and Affect: Mood normal.        Behavior: Behavior normal.     LABORATORY DATA:   I have reviewed the labs as listed.  CBC Latest Ref Rng & Units 03/27/2020 10/05/2019 06/07/2019  WBC 4.0 - 10.5 K/uL 8.6 7.1 9.8  Hemoglobin 12.0 - 15.0 g/dL 13.1 13.8 14.0  Hematocrit 36 - 46 % 41.2 43.9 44.3  Platelets 150 - 400 K/uL 240 281 264   CMP Latest Ref Rng & Units 10/05/2019 07/28/2019 06/07/2019  Glucose 70 - 99 mg/dL 84 - 95  BUN 8 - 23 mg/dL 16 - 17  Creatinine 0.44 - 1.00 mg/dL 1.02(H) 1.00 0.79  Sodium 135 - 145 mmol/L 140 - 137  Potassium 3.5 - 5.1 mmol/L 4.8 - 4.2  Chloride 98 - 111 mmol/L 103 - 103  CO2 22 - 32 mmol/L 28 - 26  Calcium 8.9 - 10.3 mg/dL 9.3 - 9.4  Total Protein 6.5 - 8.1 g/dL 7.7 - 8.0  Total Bilirubin 0.3 - 1.2 mg/dL 0.5 - 0.6  Alkaline Phos 38 - 126 U/L 76 - 61  AST 15 - 41 U/L 23 - 21  ALT 0 - 44 U/L 24 - 20      Component Value Date/Time   RBC 4.70 03/27/2020 1406   MCV 87.7 03/27/2020 1406   MCH 27.9 03/27/2020 1406   MCHC 31.8 03/27/2020 1406   RDW 15.5 03/27/2020 1406   LYMPHSABS 2.2 03/27/2020 1406   MONOABS 0.6 03/27/2020 1406   EOSABS 0.2 03/27/2020 1406   BASOSABS 0.0 03/27/2020 1406   Lab Results  Component Value Date   LDH 123 10/05/2019   LDH 142 06/07/2019   Lab Results  Component Value Date   VD25OH 50 05/10/2018   VD25OH 53 05/12/2017   VD25OH  9 (L) 05/24/2016    DIAGNOSTIC IMAGING:  I have independently reviewed the scans and discussed with the patient. NM PET Image Restag (PS) Skull Base To Thigh  Result Date: 03/06/2020 CLINICAL DATA:  Subsequent treatment strategy for MALT lymphoma. EXAM: NUCLEAR MEDICINE PET SKULL BASE TO THIGH TECHNIQUE: 10.5 mCi F-18 FDG was injected intravenously. Full-ring PET imaging was performed from the skull base to thigh after the radiotracer. CT data was obtained and used for attenuation correction and anatomic localization. Fasting blood glucose: 90 for mg/dl COMPARISON:   06/17/2019 and CT chest from 07/26/2019 FINDINGS: Mediastinal blood pool activity: SUV max 2.6 Liver activity: SUV max 3.6 NECK: No significant abnormal hypermetabolic activity in this region. Incidental CT findings: none CHEST: The small hypermetabolic focus of previous biopsy-proven MALT lymphoma in the left upper breast demonstrates notably reduced conspicuity and nodularity, with only a faint punctate calcification at the site of prior soft tissue nodularity. Current maximum SUV in this region is 1.2, previously 4.4. Appearance compatible with Deauville 2 process. Incidental CT findings: Coronary, aortic arch, and branch vessel atherosclerotic vascular disease. Small hiatal hernia. Chronic (since at least 06/08/2016) stable 2 mm right upper lobe nodule on image 14 of series 8. Centrilobular emphysema. ABDOMEN/PELVIS: Right external iliac lymph node 1.0 cm in short axis (stable) with maximum SUV 4.2 (Deauville 4), previously 4.0 (previously Deauville 4). Left external iliac node 1.0 cm in short axis on image 164 of series 4 (formerly 1.1 cm) with maximum SUV 4.4 (Deauville 4), previously 3.6 (previously Deauville 4). No new adenopathy. Incidental CT findings: Aortoiliac atherosclerotic vascular disease. Prominent stool throughout the colon favors constipation. Sigmoid colon diverticulosis. Scarring of the right kidney upper pole. SKELETON: No significant abnormal hypermetabolic activity in this region. Incidental CT findings: none IMPRESSION: 1. Improvement at site of prior biopsy proven MALT lymphoma in the left upper breast, with only faint residual punctate calcification, maximum SUV is 1.2 (Deauville 2), previously 4.4 (previously Deauville 4). 2. Stable mildly prominent bilateral external iliac lymph nodes, remaining at Deauville 4 level activity. No new adenopathy. 3. Other imaging findings of potential clinical significance: Aortic Atherosclerosis (ICD10-I70.0) and Emphysema (ICD10-J43.9). Coronary  atherosclerosis. Small hiatal hernia. Prominent stool throughout the colon favors constipation. Mild sigmoid colon diverticulosis. Electronically Signed   By: Van Clines M.D.   On: 03/06/2020 13:44     ASSESSMENT:  1.  Clinical stage III MALT lymphoma: -Mammogram on May 12, 2019 showed left breast mass. -Biopsy consistent with non-Hodgkin's lymphoma, specifically extranodal marginal zone lymphoma of MALT with plasmacytic differentiation. -She did not have any B symptoms. PET scan on 06/17/2019 showed mild hypermetabolic area in the left breast at the site of known lymphoma. Intense FDG uptake within the retrobulbar and periorbital soft tissues without signs of visible soft tissue mass or lesion. Mildly hypermetabolic lymph nodes in the external iliac chains bilaterally. Asymmetry of the tonsillar tissue with increased activity on the left side. MRI of the orbits did not show any mass. -She completed radiation therapy to the left breast mass end of March 2021. -PET CT scan on 03/06/2020 showed improvement at site of prior biopsy in the left upper breast, SUV 1.2, previously 4.4.  Stable mildly prominent bilateral external iliac lymph nodes with no new adenopathy.   2. Rheumatoid arthritis: -She was diagnosed in her 68s. Currently on Plaquenil.   PLAN:  1.  Clinical stage III MALT lymphoma: -She does not report any B symptoms.  Reviewed her CBC which shows normal white count and  platelet count and hemoglobin.  Magnesium is normal. -Reviewed images of the PET scan with the patient in detail.  No evidence of recurrence.  Pelvic lymph nodes are stable and improvement in the breast. -RTC 6 months with labs.  Plan to repeat PET scan in a year or sooner if symptoms develop.  2. Rheumatoid arthritis: -She complained of pain in her left shoulder from it.  She is continuing Plaquenil.   Orders placed this encounter:  No orders of the defined types were placed in this  encounter.    Derek Jack, MD [place] [phone]   I, Milinda Antis, am acting as a scribe for Dr. Sanda Linger.  I, Derek Jack MD, have reviewed the above documentation for accuracy and completeness, and I agree with the above.

## 2020-03-28 MED ORDER — PEGFILGRASTIM-JMDB 6 MG/0.6ML ~~LOC~~ SOSY
PREFILLED_SYRINGE | SUBCUTANEOUS | Status: AC
  Filled 2020-03-28: qty 0.6

## 2020-05-01 ENCOUNTER — Other Ambulatory Visit: Payer: Self-pay

## 2020-05-01 ENCOUNTER — Telehealth: Payer: Self-pay | Admitting: Family Medicine

## 2020-05-01 MED ORDER — ALENDRONATE SODIUM 70 MG PO TABS
ORAL_TABLET | ORAL | 0 refills | Status: DC
Start: 2020-05-01 — End: 2020-07-20

## 2020-05-01 NOTE — Telephone Encounter (Signed)
Medication refill sent to pharmacy requested by patient.

## 2020-05-01 NOTE — Telephone Encounter (Signed)
Need med refill Fosamax 70 mg Pharm: [facility]

## 2020-05-02 ENCOUNTER — Encounter: Payer: Self-pay | Admitting: Family Medicine

## 2020-05-02 ENCOUNTER — Ambulatory Visit (INDEPENDENT_AMBULATORY_CARE_PROVIDER_SITE_OTHER): Payer: Medicare Other | Admitting: Family Medicine

## 2020-05-02 ENCOUNTER — Other Ambulatory Visit: Payer: Self-pay

## 2020-05-02 VITALS — BP 130/80 | Ht 67.0 in | Wt 211.0 lb

## 2020-05-02 DIAGNOSIS — Z0000 Encounter for general adult medical examination without abnormal findings: Secondary | ICD-10-CM

## 2020-05-02 NOTE — Patient Instructions (Addendum)
[name] , Thank you for taking time to come for your Medicare Wellness Visit. I appreciate your ongoing commitment to your health goals. Please review the following plan we discussed and let me know if I can assist you in the future.   Merry Christmas and Happy New Year!  Screening recommendations/referrals: Colonoscopy: Patient is going to schedule Mammogram: Scheduled for 05/14/20 Bone Density: Complete Recommended yearly ophthalmology/optometry visit for glaucoma screening and checkup Recommended yearly dental visit for hygiene and checkup  Vaccinations: Influenza vaccine: Fall 2022 Pneumococcal vaccine: Complete Tdap vaccine: 03/24/24 Shingles vaccine: Declined  Advanced directives: POA and Living Will  Conditions/risks identified: None  Next appointment: 07/16/20 @ 9:20 am   Preventive Care 65 Years and Older, Female Preventive care refers to lifestyle choices and visits with your health care provider that can promote health and wellness. What does preventive care include?  A yearly physical exam. This is also called an annual well check.  Dental exams once or twice a year.  Routine eye exams. Ask your health care provider how often you should have your eyes checked.  Personal lifestyle choices, including:  Daily care of your teeth and gums.  Regular physical activity.  Eating a healthy diet.  Avoiding tobacco and drug use.  Limiting alcohol use.  Practicing safe sex.  Taking low-dose aspirin every day.  Taking vitamin and mineral supplements as recommended by your health care provider. What happens during an annual well check? The services and screenings done by your health care provider during your annual well check will depend on your age, overall health, lifestyle risk factors, and family history of disease. Counseling  Your health care provider may ask you questions about your:  Alcohol use.  Tobacco use.  Drug use.  Emotional  well-being.  Home and relationship well-being.  Sexual activity.  Eating habits.  History of falls.  Memory and ability to understand (cognition).  Work and work Statistician.  Reproductive health. Screening  You may have the following tests or measurements:  Height, weight, and BMI.  Blood pressure.  Lipid and cholesterol levels. These may be checked every 5 years, or more frequently if you are over 64 years old.  Skin check.  Lung cancer screening. You may have this screening every year starting at age 6 if you have a 30-pack-year history of smoking and currently smoke or have quit within the past 15 years.  Fecal occult blood test (FOBT) of the stool. You may have this test every year starting at age 78.  Flexible sigmoidoscopy or colonoscopy. You may have a sigmoidoscopy every 5 years or a colonoscopy every 10 years starting at age 58.  Hepatitis C blood test.  Hepatitis B blood test.  Sexually transmitted disease (STD) testing.  Diabetes screening. This is done by checking your blood sugar (glucose) after you have not eaten for a while (fasting). You may have this done every 1-3 years.  Bone density scan. This is done to screen for osteoporosis. You may have this done starting at age 22.  Mammogram. This may be done every 1-2 years. Talk to your health care provider about how often you should have regular mammograms. Talk with your health care provider about your test results, treatment options, and if necessary, the need for more tests. Vaccines  Your health care provider may recommend certain vaccines, such as:  Influenza vaccine. This is recommended every year.  Tetanus, diphtheria, and acellular pertussis (Tdap, Td) vaccine. You may need a Td booster every 10  years.  Zoster vaccine. You may need this after age 42.  Pneumococcal 13-valent conjugate (PCV13) vaccine. One dose is recommended after age 60.  Pneumococcal polysaccharide (PPSV23) vaccine. One  dose is recommended after age 10. Talk to your health care provider about which screenings and vaccines you need and how often you need them. This information is not intended to replace advice given to you by your health care provider. Make sure you discuss any questions you have with your health care provider. Document Released: 06/08/2015 Document Revised: 01/30/2016 Document Reviewed: 03/13/2015 Elsevier Interactive Patient Education  2017 [place] Prevention in the Home Falls can cause injuries. They can happen to people of all ages. There are many things you can do to make your home safe and to help prevent falls. What can I do on the outside of my home?  Regularly fix the edges of walkways and driveways and fix any cracks.  Remove anything that might make you trip as you walk through a door, such as a raised step or threshold.  Trim any bushes or trees on the path to your home.  Use bright outdoor lighting.  Clear any walking paths of anything that might make someone trip, such as rocks or tools.  Regularly check to see if handrails are loose or broken. Make sure that both sides of any steps have handrails.  Any raised decks and porches should have guardrails on the edges.  Have any leaves, snow, or ice cleared regularly.  Use sand or salt on walking paths during winter.  Clean up any spills in your garage right away. This includes oil or grease spills. What can I do in the bathroom?  Use night lights.  Install grab bars by the toilet and in the tub and shower. Do not use towel bars as grab bars.  Use non-skid mats or decals in the tub or shower.  If you need to sit down in the shower, use a plastic, non-slip stool.  Keep the floor dry. Clean up any water that spills on the floor as soon as it happens.  Remove soap buildup in the tub or shower regularly.  Attach bath mats securely with double-sided non-slip rug tape.  Do not have throw rugs and other  things on the floor that can make you trip. What can I do in the bedroom?  Use night lights.  Make sure that you have a light by your bed that is easy to reach.  Do not use any sheets or blankets that are too big for your bed. They should not hang down onto the floor.  Have a firm chair that has side arms. You can use this for support while you get dressed.  Do not have throw rugs and other things on the floor that can make you trip. What can I do in the kitchen?  Clean up any spills right away.  Avoid walking on wet floors.  Keep items that you use a lot in easy-to-reach places.  If you need to reach something above you, use a strong step stool that has a grab bar.  Keep electrical cords out of the way.  Do not use floor polish or wax that makes floors slippery. If you must use wax, use non-skid floor wax.  Do not have throw rugs and other things on the floor that can make you trip. What can I do with my stairs?  Do not leave any items on the stairs.  Make sure that  there are handrails on both sides of the stairs and use them. Fix handrails that are broken or loose. Make sure that handrails are as long as the stairways.  Check any carpeting to make sure that it is firmly attached to the stairs. Fix any carpet that is loose or worn.  Avoid having throw rugs at the top or bottom of the stairs. If you do have throw rugs, attach them to the floor with carpet tape.  Make sure that you have a light switch at the top of the stairs and the bottom of the stairs. If you do not have them, ask someone to add them for you. What else can I do to help prevent falls?  Wear shoes that:  Do not have high heels.  Have rubber bottoms.  Are comfortable and fit you well.  Are closed at the toe. Do not wear sandals.  If you use a stepladder:  Make sure that it is fully opened. Do not climb a closed stepladder.  Make sure that both sides of the stepladder are locked into place.  Ask  someone to hold it for you, if possible.  Clearly mark and make sure that you can see:  Any grab bars or handrails.  First and last steps.  Where the edge of each step is.  Use tools that help you move around (mobility aids) if they are needed. These include:  Canes.  Walkers.  Scooters.  Crutches.  Turn on the lights when you go into a dark area. Replace any light bulbs as soon as they burn out.  Set up your furniture so you have a clear path. Avoid moving your furniture around.  If any of your floors are uneven, fix them.  If there are any pets around you, be aware of where they are.  Review your medicines with your doctor. Some medicines can make you feel dizzy. This can increase your chance of falling. Ask your doctor what other things that you can do to help prevent falls. This information is not intended to replace advice given to you by your health care provider. Make sure you discuss any questions you have with your health care provider. Document Released: 03/08/2009 Document Revised: 10/18/2015 Document Reviewed: 06/16/2014 Elsevier Interactive Patient Education  2017 [employer].

## 2020-05-02 NOTE — Progress Notes (Addendum)
Subjective:   [name] is a 69 y.o. female who presents for Medicare Annual (Subsequent) preventive examination.  Participants: Nurse for intake and work up; Patient and Provider for Visit and Wrap up  Method of visit: Telephone Location of Patient: Home Location of Provider: Office Consent was obtain for visit over the telephone. Services rendered by provider: Visit was performed via telephone   I verified that I am speaking with the correct person using two identifiers.  Review of Systems Cardiac Risk Factors include: advanced age (>106men, >80 women)     Objective:    Today's Vitals   05/02/20 0958 05/02/20 0959  BP: 130/80   Weight: 211 lb (95.7 kg)   Height: 5\' 7"  (1.702 m)   PainSc: 0-No pain 0-No pain   Body mass index is 33.05 kg/m.  Advanced Directives 05/02/2020 03/27/2020 10/05/2019 07/07/2019 06/21/2019 06/06/2019 04/28/2018  Does Patient Have a Medical Advance Directive? Yes Yes Yes Yes Yes No No  Type of Paramedic of [place];Living will Living will;Healthcare Power of Attorney Living will;Healthcare Power of Attorney Living will;Healthcare Power of Attorney Living will;Healthcare Power of Attorney - -  Does patient want to make changes to medical advance directive? - No - Patient declined No - Patient declined No - Patient declined Yes (MAU/Ambulatory/Procedural Areas - Information given) No - Patient declined -  Copy of [place] in Chart? No - copy requested No - copy requested No - copy requested - - - -  Would patient like information on creating a medical advance directive? - - - No - Patient declined No - Patient declined No - Patient declined Yes (ED - Information included in AVS)    Current Medications (verified) Outpatient Encounter Medications as of 05/02/2020  Medication Sig  . acetaminophen (TYLENOL) 500 MG tablet Take 1,000 mg by mouth every 6 (six) hours as needed.  Marland Kitchen albuterol (PROVENTIL  HFA;VENTOLIN HFA) 108 (90 Base) MCG/ACT inhaler Inhale 2 puffs into the lungs every 6 (six) hours as needed for wheezing or shortness of breath.  Marland Kitchen alendronate (FOSAMAX) 70 MG tablet TAKE 1 TABLET BY MOUTH ONCE A WEEK. TAKE WITH A FULL GLASS OF WATER ON AN EMPTY STOMACH.  Marland Kitchen aspirin (ASPIRIN LOW DOSE) 81 MG EC tablet Take 81 mg by mouth daily.    . Calcium Carbonate-Vitamin D (CALTRATE 600+D) 600-400 MG-UNIT per tablet Take 1 tablet by mouth 2 (two) times daily.    . Cholecalciferol (VITAMIN D3) 5000 units TABS Take 1 tablet by mouth daily.  Marland Kitchen co-enzyme Q-10 30 MG capsule Take 30 mg by mouth 3 (three) times daily.  Marland Kitchen EPINEPHrine 0.3 mg/0.3 mL IJ SOAJ injection Inject 0.3 mLs (0.3 mg total) into the muscle as needed for anaphylaxis.  . fluticasone (FLONASE) 50 MCG/ACT nasal spray Place 2 sprays into both nostrils daily.  Marland Kitchen gabapentin (NEURONTIN) 100 MG capsule Take 1 capsule (100 mg total) by mouth 2 (two) times daily.  . hydroxychloroquine (PLAQUENIL) 200 MG tablet Take 400 mg by mouth daily.   Marland Kitchen loratadine (CLARITIN) 10 MG tablet Take 10 mg by mouth daily.  Marland Kitchen losartan (COZAAR) 50 MG tablet Take 1 tablet (50 mg total) by mouth daily.  . montelukast (SINGULAIR) 10 MG tablet Take 1 tablet (10 mg total) by mouth at bedtime.  . Multiple Vitamins-Minerals (CENTRUM SILVER PO) Take by mouth daily.    . Omega-3 Fatty Acids (FISH OIL) 1000 MG CAPS Take 1,000 capsules by mouth daily.   Marland Kitchen  omeprazole (PRILOSEC) 20 MG capsule Take 20 mg by mouth daily.  . pravastatin (PRAVACHOL) 10 MG tablet TAKE 1 TABLET DAILY STOP   TAKING CRESTOR  . Turmeric (QC TUMERIC COMPLEX) 500 MG CAPS Take 1 capsule by mouth.  . vitamin B-12 (CYANOCOBALAMIN) 50 MCG tablet Take 50 mcg by mouth daily.  . Zinc 50 MG TABS Take 50 mg by mouth daily.   No facility-administered encounter medications on file as of 05/02/2020.    Allergies (verified) Septra [sulfamethoxazole-trimethoprim], Barbiturates, Cefuroxime axetil, Cefuroxime  axetil, Chlordiazepoxide, Hydrocodone-acetaminophen, Hydrocodone-acetaminophen, Pentothal [thiopental], Phenytoin, Rofecoxib, Other, and Wellbutrin [bupropion]   History: Past Medical History:  Diagnosis Date  . Acute left-sided back pain with sciatica 11/27/2018  . Arthritis    Phreesia 05/01/2020  . Arthritis, rheumatoid ([place]) 1993   hx  . Cancer ([place])    Phreesia 05/01/2020  . Collagen vascular disease ([facility])    RA  . Hyperlipidemia   . Hypertension   . Kidney stone 1999  . Obesity   . Osteoporosis 2019   intolerant of fosamax due to jaw pain  . Porphyria ([place])    hx    Past Surgical History:  Procedure Laterality Date  . APPENDECTOMY  1983  . LITHOTRIPSY  2000   rt renal stone    Family History  Problem Relation Age of Onset  . Arthritis Mother   . Hypertension Mother   . Hypertension Sister   . Arthritis Sister   . Hypertension Sister   . Arthritis Sister   . Ulcerative colitis Sister   . Hypertension Sister   . Arthritis Sister   . Stroke Maternal Grandmother   . Arthritis Maternal Grandfather   . Arthritis Paternal Grandmother   . Stroke Paternal Grandfather    Social History   Socioeconomic History  . Marital status: Single    Spouse name: Not on file  . Number of children: 0  . Years of education: 12+  . Highest education level: Doctorate  Occupational History  . Occupation: Retired; Teacher part time currently  Tobacco Use  . Smoking status: Current Some Day Smoker    Packs/day: 0.50    Types: Cigarettes  . Smokeless tobacco: Never Used  . Tobacco comment: only has one occasionally  Vaping Use  . Vaping Use: Never used  Substance and Sexual Activity  . Alcohol use: No  . Drug use: No  . Sexual activity: Not Currently  Other Topics Concern  . Not on file  Social History Narrative   Lives alone    Social Determinants of Health   Financial Resource Strain: Low Risk   . Difficulty of Paying Living Expenses: Not hard at all  Food  Insecurity: No Food Insecurity  . Worried About Charity fundraiser in the Last Year: Never true  . Ran Out of Food in the Last Year: Never true  Transportation Needs: No Transportation Needs  . Lack of Transportation (Medical): No  . Lack of Transportation (Non-Medical): No  Physical Activity: Insufficiently Active  . Days of Exercise per Week: 3 days  . Minutes of Exercise per Session: 30 min  Stress: No Stress Concern Present  . Feeling of Stress : Not at all  Social Connections: Moderately Isolated  . Frequency of Communication with Friends and Family: More than three times a week  . Frequency of Social Gatherings with Friends and Family: More than three times a week  . Attends Religious Services: More than 4 times per year  . Active Member  of Clubs or Organizations: No  . Attends Archivist Meetings: Never  . Marital Status: Separated    Tobacco Counseling Ready to quit: Yes Counseling given: Yes Comment: only has one occasionally   Clinical Intake:  Pre-visit preparation completed: Yes  Pain : No/denies pain Pain Score: 0-No pain     BMI - recorded: 33.1 Nutritional Status: BMI > 30  Obese Nutritional Risks: None Diabetes: No  How often do you need to have someone help you when you read instructions, pamphlets, or other written materials from your doctor or pharmacy?: 1 - Never What is the last grade level you completed in school?: Terminal Degree  Diabetic? no  Interpreter Needed?: No  Information entered by :: Laretta Bolster, LPN   Activities of Daily Living In your present state of health, do you have any difficulty performing the following activities: 05/02/2020  Hearing? N  Vision? N  Difficulty concentrating or making decisions? N  Walking or climbing stairs? N  Dressing or bathing? N  Doing errands, shopping? N  Preparing Food and eating ? N  Using the Toilet? N  In the past six months, have you accidently leaked urine? N  Do you have  problems with loss of bowel control? N  Managing your Medications? N  Managing your Finances? N  Housekeeping or managing your Housekeeping? N  Some recent data might be hidden    Patient Care Team: Fayrene Helper, MD as PCP - General Gavin Pound, MD as Consulting Physician (Rheumatology) Donetta Potts, RN as Oncology Nurse Navigator (Oncology) Derek Jack, MD as Medical Oncologist (Oncology)  Indicate any recent Medical Services you may have received from other than [facility] providers in the past year (date may be approximate).     Assessment:   This is a routine wellness examination for [facility].  Hearing/Vision screen No exam data present  Dietary issues and exercise activities discussed: Current Exercise Habits: Home exercise routine, Type of exercise: walking, Time (Minutes): 30, Frequency (Times/Week): 3, Weekly Exercise (Minutes/Week): 90  Goals    . Increase physical activity    . Patient Stated     Stop smoking again       Depression Screen PHQ 2/9 Scores 05/02/2020 05/02/2020 03/27/2020 06/27/2019 05/02/2019 11/23/2018 04/28/2018  PHQ - 2 Score 0 0 0 1 0 0 0  PHQ- 9 Score - - - 4 - - -    Fall Risk Fall Risk  05/02/2020 02/14/2020 06/27/2019 05/02/2019 11/23/2018  Falls in the past year? 0 1 0 0 0  Number falls in past yr: 0 0 0 0 0  Injury with Fall? 0 0 0 0 0  Risk for fall due to : No Fall Risks - - - -  Follow up Falls evaluation completed - - - -    FALL RISK PREVENTION PERTAINING TO THE HOME:  Any stairs in or around the home? Yes  If so, are there any without handrails? Yes  Home free of loose throw rugs in walkways, pet beds, electrical cords, etc? Yes  Adequate lighting in your home to reduce risk of falls? Yes   ASSISTIVE DEVICES UTILIZED TO PREVENT FALLS:  Life alert? No  Use of a cane, walker or w/c? No  Grab bars in the bathroom? Yes  Shower chair or bench in shower? No  Elevated toilet seat or a handicapped toilet? No   TIMED UP AND  GO:  Was the test performed? No .  Length of time to ambulate n/a  Cognitive Function:     6CIT Screen 05/02/2020 05/02/2019 04/28/2018  What Year? 0 points 0 points 0 points  What month? 0 points 0 points 0 points  What time? 0 points 0 points 0 points  Count back from 20 0 points 0 points 0 points  Months in reverse 0 points 0 points 0 points  Repeat phrase 0 points 0 points 0 points  Total Score 0 0 0    Immunizations Immunization History  Administered Date(s) Administered  . Fluad Quad(high Dose 65+) 03/07/2019  . Influenza Split 02/04/2020  . Influenza Whole 06/16/2006, 05/17/2009  . Influenza,inj,Quad PF,6+ Mos 02/12/2018, 02/04/2020  . Moderna SARS-COVID-2 Vaccination 06/30/2019, 07/29/2019  . Pneumococcal Conjugate-13 12/21/2017  . Pneumococcal Polysaccharide-23 06/07/2019  . Td 06/16/2006  . Tdap 03/24/2014  . Zoster 09/28/2014    TDAP status: Up to date  Flu Vaccine status: Up to date  Pneumococcal vaccine status: Up to date  Covid-19 vaccine status: Completed vaccines  Qualifies for Shingles Vaccine? Yes   Zostavax completed No   Shingrix Completed?: No.    Education has been provided regarding the importance of this vaccine. Patient has been advised to call insurance company to determine out of pocket expense if they have not yet received this vaccine. Advised may also receive vaccine at local pharmacy or Health Dept. Verbalized acceptance and understanding.  Screening Tests Health Maintenance  Topic Date Due  . COLONOSCOPY  10/11/2014  . MAMMOGRAM  05/11/2021  . TETANUS/TDAP  03/24/2024  . INFLUENZA VACCINE  Completed  . DEXA SCAN  Completed  . COVID-19 Vaccine  Completed  . Hepatitis C Screening  Completed  . PNA vac Low Risk Adult  Completed    Health Maintenance  Health Maintenance Due  Topic Date Due  . COLONOSCOPY  10/11/2014    Colorectal cancer screening: Type of screening: Colonoscopy. Completed patient is going to schedule with  her GI. she was due for one in 2016.Marland Kitchen Repeat every 10 years  Mammogram status: Ordered scheduled for 05/14/20. Pt provided with contact info and advised to call to schedule appt.   Bone Density status: Completed 03/31/18. Results reflect: Bone density results: NORMAL. Repeat every 5 years.  Lung Cancer Screening: (Low Dose CT Chest recommended if Age 63-80 years, 30 pack-year currently smoking OR have quit w/in 15years.) does qualify.   Lung Cancer Screening Referral: today  Additional Screening:  Hepatitis C Screening: does not qualify  Vision Screening: Recommended annual ophthalmology exams for early detection of glaucoma and other disorders of the eye. Is the patient up to date with their annual eye exam?  Yes  Who is the provider or what is the name of the office in which the patient attends annual eye exams? Dr. Schuyler Amor in [place] If pt is not established with a provider, would they like to be referred to a provider to establish care? n/a.   Dental Screening: Recommended annual dental exams for proper oral hygiene  Community Resource Referral / Chronic Care Management: CRR required this visit?  No   CCM required this visit?  No      Plan:      1. Encounter for Medicare annual wellness exam   I have personally reviewed and noted the following in the patient's chart:   . Medical and social history . Use of alcohol, tobacco or illicit drugs  . Current medications and supplements . Functional ability and status . Nutritional status . Physical activity . Advanced directives . List of other physicians .  Hospitalizations, surgeries, and ER visits in previous 12 months . Vitals . Screenings to include cognitive, depression, and falls . Referrals and appointments  In addition, I have reviewed and discussed with patient certain preventive protocols, quality metrics, and best practice recommendations. A written personalized care plan for preventive services as well as  general preventive health recommendations were provided to patient.     Agreed with documentation.   Perlie Mayo, NP   05/02/2020   Nurse Notes: AWV conducted by nurse in office by phone. Patient gave consent to telehealth visit via audio. Patient at home. Provider here in the office. Visit took 30 minutes to complete.

## 2020-05-04 ENCOUNTER — Encounter (INDEPENDENT_AMBULATORY_CARE_PROVIDER_SITE_OTHER): Payer: Self-pay

## 2020-05-04 ENCOUNTER — Other Ambulatory Visit (INDEPENDENT_AMBULATORY_CARE_PROVIDER_SITE_OTHER): Payer: Self-pay

## 2020-05-04 ENCOUNTER — Telehealth (INDEPENDENT_AMBULATORY_CARE_PROVIDER_SITE_OTHER): Payer: Self-pay

## 2020-05-04 DIAGNOSIS — Z1211 Encounter for screening for malignant neoplasm of colon: Secondary | ICD-10-CM

## 2020-05-04 MED ORDER — PLENVU 140 G PO SOLR: 280.0000 g | Freq: Once | ORAL | 0 refills | Status: AC

## 2020-05-04 NOTE — Telephone Encounter (Signed)
Referring MD/PCP: Moshe Cipro   Procedure: Tcs w/Propofol  Reason/Indication:  Screening  Has patient had this procedure before?  yes  If so, when, by whom and where? Over 10 yrs ago  Is there a family history of colon cancer?  no  Who?  What age when diagnosed?    Is patient diabetic?   no      Does patient have prosthetic heart valve or mechanical valve?  no  Do you have a pacemaker/defibrillator?  no  Has patient ever had endocarditis/atrial fibrillation? no  Have you had a stroke/heart attack last 6 mths? no  Does patient use oxygen? no  Has patient had joint replacement within last 12 months?  no  Is patient constipated or do they take laxatives? no  Does patient have a history of alcohol/drug use?  no  Is patient on blood thinner such as Coumadin, Plavix and/or Aspirin? yes  Medications: asa 81 mg daily, alendronate 70 mg once a week, gabapentin 1000 mg bid, pravastatin 10 mg daily montelukast 10mg  daily, losartan 50 mg daily, epi-pen, turmeric 500mg  daily, Co Q 10 100 mg daily, Zinc 50 mg daily, vit b12 50 mcg daily, loratadine 10 mg daily acetaminophen 500 mg daily, albuterol 2 puffs, vit d3, plaquenil 200 mg bid, fluticasone 50 mcg, omeprazole 20 mg daily, calcium daily, mvi daily, omega 3   Allergies: phenytoin, rofecoxib, hydrocodone/acet, chlordiazepoxide, cefuroxime axetil,barbiturates,sulfamethoxazole/trimethoprim, bupropion,vioxx,most oral dibetic agents  Medication Adjustment per Dr Rehman/Dr Jenetta Downer asa 81 mg 2 days prior  Procedure date & time: 05/30/20 10:30

## 2020-05-04 NOTE — Telephone Encounter (Signed)
[name], CMA  

## 2020-05-14 ENCOUNTER — Other Ambulatory Visit: Payer: Self-pay

## 2020-05-14 ENCOUNTER — Encounter (HOSPITAL_COMMUNITY): Payer: Self-pay

## 2020-05-14 ENCOUNTER — Ambulatory Visit (HOSPITAL_COMMUNITY)
Admission: RE | Admit: 2020-05-14 | Discharge: 2020-05-14 | Disposition: A | Discharge status: Home / Self Care | Payer: Medicare Other | Source: Ambulatory Visit | Attending: Family Medicine | Admitting: Family Medicine

## 2020-05-14 DIAGNOSIS — Z1231 Encounter for screening mammogram for malignant neoplasm of breast: Secondary | ICD-10-CM | POA: Diagnosis not present

## 2020-05-22 DIAGNOSIS — M0579 Rheumatoid arthritis with rheumatoid factor of multiple sites without organ or systems involvement: Secondary | ICD-10-CM | POA: Diagnosis not present

## 2020-05-22 DIAGNOSIS — Z79899 Other long term (current) drug therapy: Secondary | ICD-10-CM | POA: Diagnosis not present

## 2020-05-22 DIAGNOSIS — M2550 Pain in unspecified joint: Secondary | ICD-10-CM | POA: Diagnosis not present

## 2020-05-22 DIAGNOSIS — H209 Unspecified iridocyclitis: Secondary | ICD-10-CM | POA: Diagnosis not present

## 2020-05-22 DIAGNOSIS — M150 Primary generalized (osteo)arthritis: Secondary | ICD-10-CM | POA: Diagnosis not present

## 2020-05-28 NOTE — Patient Instructions (Signed)
[name]  05/28/2020     @PREFPERIOPPHARMACY @   Your procedure is scheduled on   05/30/2020  Report to [facility] at  0900  A.M.  Call this number if you have problems the morning of surgery:  [phone]   Remember:  Follow the diet and prep instructions given to you by the office.                        Take these medicines the morning of surgery with A SIP OF WATER  Plaquenil, singulair, prilosec. Use your inhaler before you come.    Do not wear jewelry, make-up or nail polish.  Do not wear lotions, powders, or perfumes. Please wear deodorant and brush your teeth.  Do not shave 48 hours prior to surgery.  Men may shave face and neck.  Do not bring valuables to the hospital.  [facility] is not responsible for any belongings or valuables.  Contacts, dentures or bridgework may not be worn into surgery.  Leave your suitcase in the car.  After surgery it may be brought to your room.  For patients admitted to the hospital, discharge time will be determined by your treatment team.  Patients discharged the day of surgery will not be allowed to drive home.   Name and phone number of your driver:   family Special instructions:  DO NOT smoke the morning of your procedure.  Please read over the following fact sheets that you were given. Anesthesia Post-op Instructions and Care and Recovery After Surgery       Colonoscopy, Adult, Care After This sheet gives you information about how to care for yourself after your procedure. Your health care provider may also give you more specific instructions. If you have problems or questions, contact your health care provider. What can I expect after the procedure? After the procedure, it is common to have:  A small amount of blood in your stool for 24 hours after the procedure.  Some gas.  Mild cramping or bloating of your abdomen. Follow these instructions at home: Eating and drinking   Drink enough fluid to keep your  urine pale yellow.  Follow instructions from your health care provider about eating or drinking restrictions.  Resume your normal diet as instructed by your health care provider. Avoid heavy or fried foods that are hard to digest. Activity  Rest as told by your health care provider.  Avoid sitting for a long time without moving. Get up to take short walks every 1-2 hours. This is important to improve blood flow and breathing. Ask for help if you feel weak or unsteady.  Return to your normal activities as told by your health care provider. Ask your health care provider what activities are safe for you. Managing cramping and bloating   Try walking around when you have cramps or feel bloated.  Apply heat to your abdomen as told by your health care provider. Use the heat source that your health care provider recommends, such as a moist heat pack or a heating pad. ? Place a towel between your skin and the heat source. ? Leave the heat on for 20-30 minutes. ? Remove the heat if your skin turns bright red. This is especially important if you are unable to feel pain, heat, or cold. You may have a greater risk of getting burned. General instructions  For the first 24 hours after the procedure: ? Do  not drive or use machinery. ? Do not sign important documents. ? Do not drink alcohol. ? Do your regular daily activities at a slower pace than normal. ? Eat soft foods that are easy to digest.  Take over-the-counter and prescription medicines only as told by your health care provider.  Keep all follow-up visits as told by your health care provider. This is important. Contact a health care provider if:  You have blood in your stool 2-3 days after the procedure. Get help right away if you have:  More than a small spotting of blood in your stool.  Large blood clots in your stool.  Swelling of your abdomen.  Nausea or vomiting.  A fever.  Increasing pain in your abdomen that is not  relieved with medicine. Summary  After the procedure, it is common to have a small amount of blood in your stool. You may also have mild cramping and bloating of your abdomen.  For the first 24 hours after the procedure, do not drive or use machinery, sign important documents, or drink alcohol.  Get help right away if you have a lot of blood in your stool, nausea or vomiting, a fever, or increased pain in your abdomen. This information is not intended to replace advice given to you by your health care provider. Make sure you discuss any questions you have with your health care provider. Document Revised: 12/06/2018 Document Reviewed: 12/06/2018 Elsevier Patient Education  2020 Else[employer]. Monitored Anesthesia Care, Care After These instructions provide you with information about caring for yourself after your procedure. Your health care provider may also give you more specific instructions. Your treatment has been planned according to current medical practices, but problems sometimes occur. Call your health care provider if you have any problems or questions after your procedure. What can I expect after the procedure? After your procedure, you may:  Feel sleepy for several hours.  Feel clumsy and have poor balance for several hours.  Feel forgetful about what happened after the procedure.  Have poor judgment for several hours.  Feel nauseous or vomit.  Have a sore throat if you had a breathing tube during the procedure. Follow these instructions at home: For at least 24 hours after the procedure:      Have a responsible adult stay with you. It is important to have someone help care for you until you are awake and alert.  Rest as needed.  Do not: ? Participate in activities in which you could fall or become injured. ? Drive. ? Use heavy machinery. ? Drink alcohol. ? Take sleeping pills or medicines that cause drowsiness. ? Make important decisions or sign legal  documents. ? Take care of children on your own. Eating and drinking  Follow the diet that is recommended by your health care provider.  If you vomit, drink water, juice, or soup when you can drink without vomiting.  Make sure you have little or no nausea before eating solid foods. General instructions  Take over-the-counter and prescription medicines only as told by your health care provider.  If you have sleep apnea, surgery and certain medicines can increase your risk for breathing problems. Follow instructions from your health care provider about wearing your sleep device: ? Anytime you are sleeping, including during daytime naps. ? While taking prescription pain medicines, sleeping medicines, or medicines that make you drowsy.  If you smoke, do not smoke without supervision.  Keep all follow-up visits as told by your health care provider.  This is important. Contact a health care provider if:  You keep feeling nauseous or you keep vomiting.  You feel light-headed.  You develop a rash.  You have a fever. Get help right away if:  You have trouble breathing. Summary  For several hours after your procedure, you may feel sleepy and have poor judgment.  Have a responsible adult stay with you for at least 24 hours or until you are awake and alert. This information is not intended to replace advice given to you by your health care provider. Make sure you discuss any questions you have with your health care provider. Document Revised: 08/10/2017 Document Reviewed: 09/02/2015 Elsevier Patient Education  [place].

## 2020-05-29 ENCOUNTER — Other Ambulatory Visit (HOSPITAL_COMMUNITY)
Admission: RE | Admit: 2020-05-29 | Discharge: 2020-05-29 | Disposition: A | Discharge status: Home / Self Care | Payer: Medicare Other | Source: Ambulatory Visit | Attending: Internal Medicine | Admitting: Internal Medicine

## 2020-05-29 ENCOUNTER — Encounter (HOSPITAL_COMMUNITY): Payer: Self-pay

## 2020-05-29 ENCOUNTER — Other Ambulatory Visit: Payer: Self-pay

## 2020-05-29 ENCOUNTER — Encounter (HOSPITAL_COMMUNITY)
Admission: RE | Admit: 2020-05-29 | Discharge: 2020-05-29 | Disposition: A | Discharge status: Home / Self Care | Payer: Medicare Other | Source: Ambulatory Visit | Attending: Internal Medicine | Admitting: Internal Medicine

## 2020-05-29 DIAGNOSIS — Z01818 Encounter for other preprocedural examination: Secondary | ICD-10-CM | POA: Diagnosis not present

## 2020-05-29 DIAGNOSIS — Z20822 Contact with and (suspected) exposure to covid-19: Secondary | ICD-10-CM | POA: Diagnosis not present

## 2020-05-29 HISTORY — DX: Chronic obstructive pulmonary disease, unspecified: J44.9

## 2020-05-29 HISTORY — DX: Non-Hodgkin lymphoma, unspecified, unspecified site: C85.90

## 2020-05-29 LAB — SARS CORONAVIRUS 2 (TAT 6-24 HRS): SARS Coronavirus 2: NEGATIVE

## 2020-05-30 ENCOUNTER — Other Ambulatory Visit: Payer: Self-pay

## 2020-05-30 ENCOUNTER — Ambulatory Visit (HOSPITAL_COMMUNITY): Payer: BC Managed Care – PPO | Admitting: Anesthesiology

## 2020-05-30 ENCOUNTER — Encounter (HOSPITAL_COMMUNITY): Payer: Self-pay | Admitting: Internal Medicine

## 2020-05-30 ENCOUNTER — Encounter (HOSPITAL_COMMUNITY)
Admission: RE | Disposition: A | Discharge status: Home / Self Care | Payer: Self-pay | Source: Home / Self Care | Attending: Internal Medicine

## 2020-05-30 ENCOUNTER — Ambulatory Visit (HOSPITAL_COMMUNITY)
Admission: RE | Admit: 2020-05-30 | Discharge: 2020-05-30 | Disposition: A | Discharge status: Home / Self Care | Payer: BC Managed Care – PPO | Attending: Internal Medicine | Admitting: Internal Medicine

## 2020-05-30 DIAGNOSIS — D123 Benign neoplasm of transverse colon: Secondary | ICD-10-CM | POA: Diagnosis not present

## 2020-05-30 DIAGNOSIS — Z881 Allergy status to other antibiotic agents status: Secondary | ICD-10-CM | POA: Insufficient documentation

## 2020-05-30 DIAGNOSIS — Z79899 Other long term (current) drug therapy: Secondary | ICD-10-CM | POA: Diagnosis not present

## 2020-05-30 DIAGNOSIS — Z87891 Personal history of nicotine dependence: Secondary | ICD-10-CM | POA: Diagnosis not present

## 2020-05-30 DIAGNOSIS — K5730 Diverticulosis of large intestine without perforation or abscess without bleeding: Secondary | ICD-10-CM | POA: Insufficient documentation

## 2020-05-30 DIAGNOSIS — K644 Residual hemorrhoidal skin tags: Secondary | ICD-10-CM | POA: Insufficient documentation

## 2020-05-30 DIAGNOSIS — Z8572 Personal history of non-Hodgkin lymphomas: Secondary | ICD-10-CM | POA: Insufficient documentation

## 2020-05-30 DIAGNOSIS — J449 Chronic obstructive pulmonary disease, unspecified: Secondary | ICD-10-CM | POA: Diagnosis not present

## 2020-05-30 DIAGNOSIS — Z1211 Encounter for screening for malignant neoplasm of colon: Secondary | ICD-10-CM | POA: Insufficient documentation

## 2020-05-30 DIAGNOSIS — Z7983 Long term (current) use of bisphosphonates: Secondary | ICD-10-CM | POA: Insufficient documentation

## 2020-05-30 DIAGNOSIS — K635 Polyp of colon: Secondary | ICD-10-CM | POA: Insufficient documentation

## 2020-05-30 DIAGNOSIS — I10 Essential (primary) hypertension: Secondary | ICD-10-CM | POA: Diagnosis not present

## 2020-05-30 DIAGNOSIS — Z7982 Long term (current) use of aspirin: Secondary | ICD-10-CM | POA: Diagnosis not present

## 2020-05-30 DIAGNOSIS — D127 Benign neoplasm of rectosigmoid junction: Secondary | ICD-10-CM | POA: Diagnosis not present

## 2020-05-30 DIAGNOSIS — Z885 Allergy status to narcotic agent status: Secondary | ICD-10-CM | POA: Insufficient documentation

## 2020-05-30 DIAGNOSIS — D125 Benign neoplasm of sigmoid colon: Secondary | ICD-10-CM | POA: Diagnosis not present

## 2020-05-30 HISTORY — PX: COLONOSCOPY WITH PROPOFOL: SHX5780

## 2020-05-30 HISTORY — PX: POLYPECTOMY: SHX5525

## 2020-05-30 LAB — HM COLONOSCOPY

## 2020-05-30 SURGERY — COLONOSCOPY WITH PROPOFOL
Anesthesia: General

## 2020-05-30 MED ORDER — LIDOCAINE HCL (CARDIAC) PF 100 MG/5ML IV SOSY
PREFILLED_SYRINGE | INTRAVENOUS | Status: DC | PRN
  Administered 2020-05-30: 50 mg via INTRAVENOUS

## 2020-05-30 MED ORDER — PHENYLEPHRINE 40 MCG/ML (10ML) SYRINGE FOR IV PUSH (FOR BLOOD PRESSURE SUPPORT)
PREFILLED_SYRINGE | INTRAVENOUS | Status: DC | PRN
  Administered 2020-05-30 (×3): 80 ug via INTRAVENOUS

## 2020-05-30 MED ORDER — CHLORHEXIDINE GLUCONATE CLOTH 2 % EX PADS: 6.0000 | MEDICATED_PAD | Freq: Once | CUTANEOUS | Status: DC

## 2020-05-30 MED ORDER — PHENYLEPHRINE 40 MCG/ML (10ML) SYRINGE FOR IV PUSH (FOR BLOOD PRESSURE SUPPORT)
PREFILLED_SYRINGE | INTRAVENOUS | Status: AC
  Filled 2020-05-30: qty 10

## 2020-05-30 MED ORDER — LACTATED RINGERS IV SOLN: INTRAVENOUS | Status: DC

## 2020-05-30 MED ORDER — FISH OIL 1000 MG PO CAPS: 1000.0000 mg | ORAL_CAPSULE | Freq: Every day | ORAL | 0 refills | Status: AC

## 2020-05-30 MED ORDER — PROPOFOL 500 MG/50ML IV EMUL
INTRAVENOUS | Status: DC | PRN
  Administered 2020-05-30: 150 ug/kg/min via INTRAVENOUS

## 2020-05-30 MED ORDER — PROPOFOL 10 MG/ML IV BOLUS
INTRAVENOUS | Status: DC | PRN
  Administered 2020-05-30: 60 mg via INTRAVENOUS
  Administered 2020-05-30: 40 mg via INTRAVENOUS
  Administered 2020-05-30: 100 mg via INTRAVENOUS

## 2020-05-30 NOTE — H&P (Signed)
[name] is an 70 y.o. female.   Chief Complaint: Patient is here for colonoscopy. HPI: Patient is 70 year old African-American female who is here for screening colonoscopy.  Her last exam was normal over 10 years ago.  She denies abdominal pain change in bowel habits or rectal bleeding. Family history is negative for colorectal carcinoma. Last aspirin dose was 2 days ago.  Past Medical History:  Diagnosis Date  . Acute left-sided back pain with sciatica 11/27/2018  . Arthritis    Phreesia 05/01/2020  . Arthritis, rheumatoid ([place]) 1993   hx  . Cancer ([place])    Phreesia 05/01/2020  . Collagen vascular disease ([facility])    RA  . COPD (chronic obstructive pulmonary disease) ([place])   . Hyperlipidemia   . Hypertension   . Kidney stone 1999  . Non Hodgkin's lymphoma ([place])   . Obesity   . Osteoporosis 2019   intolerant of fosamax due to jaw pain  . Porphyria ([place])    hx     Past Surgical History:  Procedure Laterality Date  . APPENDECTOMY  1983  . BREAST BIOPSY Left    diagnosid with non hogdkins lymphoma  . LITHOTRIPSY  2000   rt renal stone     Family History  Problem Relation Age of Onset  . Arthritis Mother   . Hypertension Mother   . Hypertension Sister   . Arthritis Sister   . Hypertension Sister   . Arthritis Sister   . Ulcerative colitis Sister   . Hypertension Sister   . Arthritis Sister   . Stroke Maternal Grandmother   . Arthritis Maternal Grandfather   . Arthritis Paternal Grandmother   . Stroke Paternal Grandfather    Social History:  reports that she quit smoking 5 days ago. Her smoking use included cigarettes. She smoked 0.50 packs per day. She has never used smokeless tobacco. She reports that she does not drink alcohol and does not use drugs.  Allergies:  Allergies  Allergen Reactions  . Septra [Sulfamethoxazole-Trimethoprim] Dermatitis  . Barbiturates Other (See Comments)    Prophyria   . Chlordiazepoxide Other (See Comments)    unknown  .  Hydrocodone-Acetaminophen Hives and Other (See Comments)  . Pentothal [Thiopental]     Hives?  . Phenytoin Other (See Comments)    Prophyria   . Rofecoxib Other (See Comments)    Prophyria   . Cefuroxime Axetil Rash  . Other   . Wellbutrin [Bupropion] Palpitations    Medications Prior to Admission  Medication Sig Dispense Refill  . acetaminophen (TYLENOL) 500 MG tablet Take 1,000 mg by mouth every 6 (six) hours as needed for moderate pain.    Marland Kitchen albuterol (PROVENTIL HFA;VENTOLIN HFA) 108 (90 Base) MCG/ACT inhaler Inhale 2 puffs into the lungs every 6 (six) hours as needed for wheezing or shortness of breath. 1 Inhaler 0  . Calcium Carbonate-Vitamin D 600-400 MG-UNIT tablet Take 1 tablet by mouth daily.    . Cholecalciferol (VITAMIN D3) 5000 units TABS Take 5,000 Units by mouth daily.    . Coenzyme Q10 100 MG capsule Take 100 mg by mouth 3 (three) times daily.    Marland Kitchen EPINEPHrine 0.3 mg/0.3 mL IJ SOAJ injection Inject 0.3 mLs (0.3 mg total) into the muscle as needed for anaphylaxis. 1 each 2  . fluticasone (FLONASE) 50 MCG/ACT nasal spray Place 2 sprays into both nostrils daily. (Patient taking differently: Place 2 sprays into both nostrils daily as needed for allergies.) 48 g 1  . gabapentin (NEURONTIN)  100 MG capsule Take 1 capsule (100 mg total) by mouth 2 (two) times daily. (Patient taking differently: Take 100 mg by mouth at bedtime.) 180 capsule 2  . hydroxychloroquine (PLAQUENIL) 200 MG tablet Take 400 mg by mouth daily.     Marland Kitchen loratadine (CLARITIN) 10 MG tablet Take 10 mg by mouth daily.    Marland Kitchen losartan (COZAAR) 50 MG tablet Take 1 tablet (50 mg total) by mouth daily. 90 tablet 3  . montelukast (SINGULAIR) 10 MG tablet Take 1 tablet (10 mg total) by mouth at bedtime. 90 tablet 3  . Multiple Vitamins-Minerals (CENTRUM SILVER PO) Take 1 tablet by mouth daily.    . Omega-3 Fatty Acids (FISH OIL) 1000 MG CAPS Take 1,000 capsules by mouth daily.    Marland Kitchen omeprazole (PRILOSEC) 20 MG capsule Take 20  mg by mouth daily.    . pravastatin (PRAVACHOL) 10 MG tablet TAKE 1 TABLET DAILY STOP   TAKING CRESTOR (Patient taking differently: Take 10 mg by mouth 4 (four) times a week.) 90 tablet 3  . Turmeric 500 MG CAPS Take 1 capsule by mouth.    . vitamin B-12 (CYANOCOBALAMIN) 50 MCG tablet Take 50 mcg by mouth daily.    . Zinc 50 MG TABS Take 50 mg by mouth daily.    Marland Kitchen alendronate (FOSAMAX) 70 MG tablet TAKE 1 TABLET BY MOUTH ONCE A WEEK. TAKE WITH A FULL GLASS OF WATER ON AN EMPTY STOMACH. (Patient taking differently: Take 70 mg by mouth once a week. TAKE 1 TABLET BY MOUTH ONCE A WEEK. TAKE WITH A FULL GLASS OF WATER ON AN EMPTY STOMACH.) 12 tablet 0  . aspirin 81 MG EC tablet Take 81 mg by mouth daily.      Results for orders placed or performed during the hospital encounter of 05/29/20 (from the past 48 hour(s))  SARS CORONAVIRUS 2 (TAT 6-24 HRS) Nasopharyngeal Nasopharyngeal Swab     Status: None   Collection Time: 05/29/20  9:33 AM   Specimen: Nasopharyngeal Swab  Result Value Ref Range   SARS Coronavirus 2 NEGATIVE NEGATIVE    Comment: (NOTE) SARS-CoV-2 target nucleic acids are NOT DETECTED.  The SARS-CoV-2 RNA is generally detectable in upper and lower respiratory specimens during the acute phase of infection. Negative results do not preclude SARS-CoV-2 infection, do not rule out co-infections with other pathogens, and should not be used as the sole basis for treatment or other patient management decisions. Negative results must be combined with clinical observations, patient history, and epidemiological information. The expected result is Negative.  Fact Sheet for Patients: SugarRoll.be  Fact Sheet for Healthcare Providers: https://www.woods-mathews.com/  This test is not yet approved or cleared by the [place] FDA and  has been authorized for detection and/or diagnosis of SARS-CoV-2 by FDA under an Emergency Use Authorization (EUA).  This EUA will remain  in effect (meaning this test can be used) for the duration of the COVID-19 declaration under Se ction 564(b)(1) of the Act, 21 U.S.C. section 360bbb-3(b)(1), unless the authorization is terminated or revoked sooner.  Performed at [facility], [place] [address], [place], [place] [address]    No results found.  Review of Systems  Blood pressure 138/84, pulse 80, temperature 98.8 F (37.1 C), temperature source Oral, resp. rate 14, height 5\' 7"  (1.702 m), weight 93.2 kg, SpO2 100 %. Physical Exam HENT:     Mouth/Throat:     Mouth: Mucous membranes are moist.     Pharynx: Oropharynx is clear.  Eyes:     General: No scleral icterus.    Conjunctiva/sclera: Conjunctivae normal.  Cardiovascular:     Rate and Rhythm: Normal rate and regular rhythm.     Heart sounds: Normal heart sounds. No murmur heard.   Pulmonary:     Effort: Pulmonary effort is normal.     Breath sounds: Normal breath sounds.  Abdominal:     Comments: Abdomen is symmetrical with appendectomy scar.  On palpation is soft and nontender without organomegaly or masses  Musculoskeletal:        General: No swelling.     Cervical back: Neck supple.  Lymphadenopathy:     Cervical: No cervical adenopathy.  Skin:    General: Skin is warm and dry.  Neurological:     Mental Status: She is alert.      Assessment/Plan  Average risk screening colonoscopy.  Lionel December, MD 05/30/2020, 11:07 AM

## 2020-05-30 NOTE — Discharge Instructions (Signed)
No aspirin or NSAIDs for 3 days. Resume other medications as before High-fiber diet. No driving for 24 hours. Physician will call with biopsy results.      Colonoscopy, Adult, Care After This sheet gives you information about how to care for yourself after your procedure. Your doctor may also give you more specific instructions. If you have problems or questions, call your doctor. What can I expect after the procedure? After the procedure, it is common to have:  A small amount of blood in your poop (stool) for 24 hours.  Some gas.  Mild cramping or bloating in your belly (abdomen). Follow these instructions at home: Eating and drinking   Drink enough fluid to keep your pee (urine) pale yellow.  Follow instructions from your doctor about what you cannot eat or drink.  Return to your normal diet as told by your doctor. Avoid heavy or fried foods that are hard to digest. Activity  Rest as told by your doctor.  Do not sit for a long time without moving. Get up to take short walks every 1-2 hours. This is important. Ask for help if you feel weak or unsteady.  Return to your normal activities as told by your doctor. Ask your doctor what activities are safe for you. To help cramping and bloating:   Try walking around.  Put heat on your belly as told by your doctor. Use the heat source that your doctor recommends, such as a moist heat pack or a heating pad. ? Put a towel between your skin and the heat source. ? Leave the heat on for 20-30 minutes. ? Remove the heat if your skin turns bright red. This is very important if you are unable to feel pain, heat, or cold. You may have a greater risk of getting burned. General instructions  For the first 24 hours after the procedure: ? Do not drive or use machinery. ? Do not sign important documents. ? Do not drink alcohol. ? Do your daily activities more slowly than normal. ? Eat foods that are soft and easy to digest.  Take  over-the-counter or prescription medicines only as told by your doctor.  Keep all follow-up visits as told by your doctor. This is important. Contact a doctor if:  You have blood in your poop 2-3 days after the procedure. Get help right away if:  You have more than a small amount of blood in your poop.  You see large clumps of tissue (blood clots) in your poop.  Your belly is swollen.  You feel like you may vomit (nauseous).  You vomit.  You have a fever.  You have belly pain that gets worse, and medicine does not help your pain. Summary  After the procedure, it is common to have a small amount of blood in your poop. You may also have mild cramping and bloating in your belly.  For the first 24 hours after the procedure, do not drive or use machinery, do not sign important documents, and do not drink alcohol.  Get help right away if you have a lot of blood in your poop, feel like you may vomit, have a fever, or have more belly pain. This information is not intended to replace advice given to you by your health care provider. Make sure you discuss any questions you have with your health care provider. Document Revised: 12/06/2018 Document Reviewed: 12/06/2018 Elsevier Patient Education  [place].     Colon Polyps  Polyps are tissue  growths inside the body. Polyps can grow in many places, including the large intestine (colon). A polyp may be a round bump or a mushroom-shaped growth. You could have one polyp or several. Most colon polyps are noncancerous (benign). However, some colon polyps can become cancerous over time. Finding and removing the polyps early can help prevent this. What are the causes? The exact cause of colon polyps is not known. What increases the risk? You are more likely to develop this condition if you:  Have a family history of colon cancer or colon polyps.  Are older than 55 or older than 45 if you are African American.  Have inflammatory  bowel disease, such as ulcerative colitis or Crohn's disease.  Have certain hereditary conditions, such as: ? Familial adenomatous polyposis. ? Lynch syndrome. ? Turcot syndrome. ? Peutz-Jeghers syndrome.  Are overweight.  Smoke cigarettes.  Do not get enough exercise.  Drink too much alcohol.  Eat a diet that is high in fat and red meat and low in fiber.  Had childhood cancer that was treated with abdominal radiation. What are the signs or symptoms? Most polyps do not cause symptoms. If you have symptoms, they may include:  Blood coming from your rectum when having a bowel movement.  Blood in your stool. The stool may look dark red or black.  Abdominal pain.  A change in bowel habits, such as constipation or diarrhea. How is this diagnosed? This condition is diagnosed with a colonoscopy. This is a procedure in which a lighted, flexible scope is inserted into the anus and then passed into the colon to examine the area. Polyps are sometimes found when a colonoscopy is done as part of routine cancer screening tests. How is this treated? Treatment for this condition involves removing any polyps that are found. Most polyps can be removed during a colonoscopy. Those polyps will then be tested for cancer. Additional treatment may be needed depending on the results of testing. Follow these instructions at home: Lifestyle  Maintain a healthy weight, or lose weight if recommended by your health care provider.  Exercise every day or as told by your health care provider.  Do not use any products that contain nicotine or tobacco, such as cigarettes and e-cigarettes. If you need help quitting, ask your health care provider.  If you drink alcohol, limit how much you have: ? 0-1 drink a day for women. ? 0-2 drinks a day for men.  Be aware of how much alcohol is in your drink. In the [place] one drink equals one 12 oz bottle of beer (355 mL), one 5 oz glass of wine (148 mL), or one 1 oz  shot of hard liquor (44 mL). Eating and drinking   Eat foods that are high in fiber, such as fruits, vegetables, and whole grains.  Eat foods that are high in calcium and vitamin D, such as milk, cheese, yogurt, eggs, liver, fish, and broccoli.  Limit foods that are high in fat, such as fried foods and desserts.  Limit the amount of red meat and processed meat you eat, such as hot dogs, sausage, bacon, and lunch meats. General instructions  Keep all follow-up visits as told by your health care provider. This is important. ? This includes having regularly scheduled colonoscopies. ? Talk to your health care provider about when you need a colonoscopy. Contact a health care provider if:  You have new or worsening bleeding during a bowel movement.  You have new or increased blood  in your stool.  You have a change in bowel habits.  You lose weight for no known reason. Summary  Polyps are tissue growths inside the body. Polyps can grow in many places, including the colon.  Most colon polyps are noncancerous (benign), but some can become cancerous over time.  This condition is diagnosed with a colonoscopy.  Treatment for this condition involves removing any polyps that are found. Most polyps can be removed during a colonoscopy. This information is not intended to replace advice given to you by your health care provider. Make sure you discuss any questions you have with your health care provider. Document Revised: 08/27/2017 Document Reviewed: 08/27/2017 Elsevier Patient Education  2020 Elsevier Inc.     High-Fiber Diet Fiber, also called dietary fiber, is a type of carbohydrate that is found in fruits, vegetables, whole grains, and beans. A high-fiber diet can have many health benefits. Your health care provider may recommend a high-fiber diet to help:  Prevent constipation. Fiber can make your bowel movements more regular.  Lower your cholesterol.  Relieve the following  conditions: ? Swelling of veins in the anus (hemorrhoids). ? Swelling and irritation (inflammation) of specific areas of the digestive tract (uncomplicated diverticulosis). ? A problem of the large intestine (colon) that sometimes causes pain and diarrhea (irritable bowel syndrome, IBS).  Prevent overeating as part of a weight-loss plan.  Prevent heart disease, type 2 diabetes, and certain cancers. What is my plan? The recommended daily fiber intake in grams (g) includes:  38 g for men age 63 or younger.  30 g for men over age 40.  25 g for women age 8 or younger.  21 g for women over age 28. You can get the recommended daily intake of dietary fiber by:  Eating a variety of fruits, vegetables, grains, and beans.  Taking a fiber supplement, if it is not possible to get enough fiber through your diet. What do I need to know about a high-fiber diet?  It is better to get fiber through food sources rather than from fiber supplements. There is not a lot of research about how effective supplements are.  Always check the fiber content on the nutrition facts label of any prepackaged food. Look for foods that contain 5 g of fiber or more per serving.  Talk with a diet and nutrition specialist (dietitian) if you have questions about specific foods that are recommended or not recommended for your medical condition, especially if those foods are not listed below.  Gradually increase how much fiber you consume. If you increase your intake of dietary fiber too quickly, you may have bloating, cramping, or gas.  Drink plenty of water. Water helps you to digest fiber. What are tips for following this plan?  Eat a wide variety of high-fiber foods.  Make sure that half of the grains that you eat each day are whole grains.  Eat breads and cereals that are made with whole-grain flour instead of refined flour or white flour.  Eat brown rice, bulgur wheat, or millet instead of white rice.  Start  the day with a breakfast that is high in fiber, such as a cereal that contains 5 g of fiber or more per serving.  Use beans in place of meat in soups, salads, and pasta dishes.  Eat high-fiber snacks, such as berries, raw vegetables, nuts, and popcorn.  Choose whole fruits and vegetables instead of processed forms like juice or sauce. What foods can I eat?  Fruits  Berries. Pears. Apples. Oranges. Avocado. Prunes and raisins. Dried figs. Vegetables Sweet potatoes. Spinach. Kale. Artichokes. Cabbage. Broccoli. Cauliflower. Green peas. Carrots. Squash. Grains Whole-grain breads. Multigrain cereal. Oats and oatmeal. Brown rice. Barley. Bulgur wheat. [place]. Quinoa. Bran muffins. Popcorn. Rye wafer crackers. Meats and other proteins Navy, kidney, and pinto beans. Soybeans. Split peas. Lentils. Nuts and seeds. Dairy Fiber-fortified yogurt. Beverages Fiber-fortified soy milk. Fiber-fortified orange juice. Other foods Fiber bars. The items listed above may not be a complete list of recommended foods and beverages. Contact a dietitian for more options. What foods are not recommended? Fruits Fruit juice. Cooked, strained fruit. Vegetables Fried potatoes. Canned vegetables. Well-cooked vegetables. Grains White bread. Pasta made with refined flour. White rice. Meats and other proteins Fatty cuts of meat. Fried chicken or fried fish. Dairy Milk. Yogurt. Cream cheese. Sour cream. Fats and oils Butters. Beverages Soft drinks. Other foods Cakes and pastries. The items listed above may not be a complete list of foods and beverages to avoid. Contact a dietitian for more information. Summary  Fiber is a type of carbohydrate. It is found in fruits, vegetables, whole grains, and beans.  There are many health benefits of eating a high-fiber diet, such as preventing constipation, lowering blood cholesterol, helping with weight loss, and reducing your risk of heart disease, diabetes, and  certain cancers.  Gradually increase your intake of fiber. Increasing too fast can result in cramping, bloating, and gas. Drink plenty of water while you increase your fiber.  The best sources of fiber include whole fruits and vegetables, whole grains, nuts, seeds, and beans. This information is not intended to replace advice given to you by your health care provider. Make sure you discuss any questions you have with your health care provider. Document Revised: 03/16/2017 Document Reviewed: 03/16/2017 Elsevier Patient Education  2020 [place] After These instructions provide you with information about caring for yourself after your procedure. Your health care provider may also give you more specific instructions. Your treatment has been planned according to current medical practices, but problems sometimes occur. Call your health care provider if you have any problems or questions after your procedure. What can I expect after the procedure? After your procedure, you may:  Feel sleepy for several hours.  Feel clumsy and have poor balance for several hours.  Feel forgetful about what happened after the procedure.  Have poor judgment for several hours.  Feel nauseous or vomit.  Have a sore throat if you had a breathing tube during the procedure. Follow these instructions at home: For at least 24 hours after the procedure:      Have a responsible adult stay with you. It is important to have someone help care for you until you are awake and alert.  Rest as needed.  Do not: ? Participate in activities in which you could fall or become injured. ? Drive. ? Use heavy machinery. ? Drink alcohol. ? Take sleeping pills or medicines that cause drowsiness. ? Make important decisions or sign legal documents. ? Take care of children on your own. Eating and drinking  Follow the diet that is recommended by your health care provider.  If you  vomit, drink water, juice, or soup when you can drink without vomiting.  Make sure you have little or no nausea before eating solid foods. General instructions  Take over-the-counter and prescription medicines only as told by your health care provider.  If you have sleep apnea, surgery  and certain medicines can increase your risk for breathing problems. Follow instructions from your health care provider about wearing your sleep device: ? Anytime you are sleeping, including during daytime naps. ? While taking prescription pain medicines, sleeping medicines, or medicines that make you drowsy.  If you smoke, do not smoke without supervision.  Keep all follow-up visits as told by your health care provider. This is important. Contact a health care provider if:  You keep feeling nauseous or you keep vomiting.  You feel light-headed.  You develop a rash.  You have a fever. Get help right away if:  You have trouble breathing. Summary  For several hours after your procedure, you may feel sleepy and have poor judgment.  Have a responsible adult stay with you for at least 24 hours or until you are awake and alert. This information is not intended to replace advice given to you by your health care provider. Make sure you discuss any questions you have with your health care provider. Document Revised: 08/10/2017 Document Reviewed: 09/02/2015 Elsevier Patient Education  [place].

## 2020-05-30 NOTE — Transfer of Care (Signed)
Immediate Anesthesia Transfer of Care Note  Patient: [name]  Procedure(s) Performed: COLONOSCOPY WITH PROPOFOL (N/A ) POLYPECTOMY  Patient Location: PACU  Anesthesia Type:General  Level of Consciousness: awake, alert  and oriented  Airway & Oxygen Therapy: Patient Spontanous Breathing  Post-op Assessment: Report given to RN, Post -op Vital signs reviewed and stable and Patient moving all extremities X 4  Post vital signs: Reviewed and stable  Last Vitals:  Vitals Value Taken Time  BP    Temp    Pulse 71 05/30/20 1156  Resp 21 05/30/20 1156  SpO2 100 % 05/30/20 1156  Vitals shown include unvalidated device data.  Last Pain:  Vitals:   05/30/20 1056  TempSrc:   PainSc: 0-No pain         Complications: No complications documented.

## 2020-05-30 NOTE — Anesthesia Postprocedure Evaluation (Signed)
Anesthesia Post Note  Patient: [name]  Procedure(s) Performed: COLONOSCOPY WITH PROPOFOL (N/A ) POLYPECTOMY  Patient location during evaluation: Phase II Anesthesia Type: General Level of consciousness: awake Pain management: pain level controlled Vital Signs Assessment: post-procedure vital signs reviewed and stable Respiratory status: spontaneous breathing Cardiovascular status: blood pressure returned to baseline Postop Assessment: no headache Anesthetic complications: no   No complications documented.   Last Vitals:  Vitals:   05/30/20 1215 05/30/20 1221  BP: 133/73 (!) 146/90  Pulse: 69 68  Resp: (!) 22 18  Temp:  36.4 C  SpO2: 100% 100%    Last Pain:  Vitals:   05/30/20 1221  TempSrc: Axillary  PainSc: 0-No pain                 Windell Norfolk

## 2020-05-30 NOTE — Anesthesia Preprocedure Evaluation (Signed)
Anesthesia Evaluation  Patient identified by MRN, date of birth, ID band Patient awake    Reviewed: Allergy & Precautions, H&P , NPO status , Patient's Chart, lab work & pertinent test results, reviewed documented beta blocker date and time   Airway Mallampati: II  TM Distance: >3 FB Neck ROM: full    Dental no notable dental hx.    Pulmonary COPD, Patient abstained from smoking., former smoker,    Pulmonary exam normal breath sounds clear to auscultation       Cardiovascular Exercise Tolerance: Good hypertension, negative cardio ROS   Rhythm:regular Rate:Normal     Neuro/Psych PSYCHIATRIC DISORDERS negative neurological ROS     GI/Hepatic Neg liver ROS, GERD  Medicated,  Endo/Other  negative endocrine ROS  Renal/GU negative Renal ROS  negative genitourinary   Musculoskeletal   Abdominal   Peds  Hematology negative hematology ROS (+)   Anesthesia Other Findings   Reproductive/Obstetrics negative OB ROS                             Anesthesia Physical Anesthesia Plan  ASA: III  Anesthesia Plan: General   Post-op Pain Management:    Induction:   PONV Risk Score and Plan: Propofol infusion  Airway Management Planned:   Additional Equipment:   Intra-op Plan:   Post-operative Plan:   Informed Consent: I have reviewed the patients History and Physical, chart, labs and discussed the procedure including the risks, benefits and alternatives for the proposed anesthesia with the patient or authorized representative who has indicated his/her understanding and acceptance.     Dental Advisory Given  Plan Discussed with: CRNA  Anesthesia Plan Comments:         Anesthesia Quick Evaluation

## 2020-05-30 NOTE — Op Note (Signed)
[facility] Patient Name: [name] Procedure Date: 05/30/2020 10:55 AM MRN: [id number] Date of Birth: 08/06/1950 Attending MD: Hildred Laser , MD CSN: [id number] Age: 70 Admit Type: Outpatient Procedure:                Colonoscopy Indications:              Screening for colorectal malignant neoplasm Providers:                Hildred Laser, MD, Janeece Riggers, RN, Nelma Rothman,                            Technician Referring MD:             Norwood Levo. Moshe Cipro, MD Medicines:                Propofol per Anesthesia Complications:            No immediate complications. Estimated Blood Loss:     Estimated blood loss was minimal. Procedure:                Pre-Anesthesia Assessment:                           - Prior to the procedure, a History and Physical                            was performed, and patient medications and                            allergies were reviewed. The patient's tolerance of                            previous anesthesia was also reviewed. The risks                            and benefits of the procedure and the sedation                            options and risks were discussed with the patient.                            All questions were answered, and informed consent                            was obtained. Prior Anticoagulants: The patient has                            taken no previous anticoagulant or antiplatelet                            agents except for aspirin. ASA Grade Assessment:                            III - A patient with severe systemic disease. After  reviewing the risks and benefits, the patient was                            deemed in satisfactory condition to undergo the                            procedure.                           After obtaining informed consent, the colonoscope                            was passed under direct vision. Throughout the                            procedure, the patient's  blood pressure, pulse, and                            oxygen saturations were monitored continuously. The                            [id number]-[id number]) scope was introduced through                            the anus and advanced to the the cecum, identified                            by appendiceal orifice and ileocecal valve. The                            colonoscopy was performed without difficulty. The                            patient tolerated the procedure well. The quality                            of the bowel preparation was adequate to identify                            polyps. The ileocecal valve, appendiceal orifice,                            and rectum were photographed. Scope In: 11:17:26 AM Scope Out: 11:50:16 AM Scope Withdrawal Time: 0 hours 24 minutes 9 seconds  Total Procedure Duration: 0 hours 32 minutes 50 seconds  Findings:      The perianal and digital rectal examinations were normal.      A small polyp was found in the splenic flexure. The polyp was removed       with a cold snare. Resection was complete, but the polyp tissue was not       retrieved.      Three polyps were found in the recto-sigmoid colon and sigmoid colon.       The polyps were 4 to 7 mm in size. These polyps were removed with a cold  snare. Resection and retrieval were complete. The pathology specimen was       placed into Bottle Number 2.      Scattered diverticula were found in the sigmoid colon.      External hemorrhoids were found during retroflexion. The hemorrhoids       were small. Impression:               - One small polyp at the splenic flexure, removed                            with a cold snare. Complete resection. Polyp tissue                            not retrieved.                           - Three 4 to 7 mm polyps at the recto-sigmoid colon                            and in the sigmoid colon, removed with a cold                            snare. Resected and  retrieved.                           - Diverticulosis in the sigmoid colon.                           - External hemorrhoids. Moderate Sedation:      Per Anesthesia Care Recommendation:           - Patient has a contact number available for                            emergencies. The signs and symptoms of potential                            delayed complications were discussed with the                            patient. Return to normal activities tomorrow.                            Written discharge instructions were provided to the                            patient.                           - High fiber diet today.                           - Continue present medications.                           - No aspirin, ibuprofen, naproxen, or other  non-steroidal anti-inflammatory drugs for 3 days.                           - Await pathology results.                           - Repeat colonoscopy is recommended. The                            colonoscopy date will be determined after pathology                            results from today's exam become available for                            review. Procedure Code(s):        --- Professional ---                           [phone], Colonoscopy, flexible; with removal of                            tumor(s), polyp(s), or other lesion(s) by snare                            technique Diagnosis Code(s):        --- Professional ---                           K63.5, Polyp of colon                           Z12.11, Encounter for screening for malignant                            neoplasm of colon                           K64.4, Residual hemorrhoidal skin tags                           K57.30, Diverticulosis of large intestine without                            perforation or abscess without bleeding CPT copyright 2019 [employer]. All rights reserved. The codes documented in this report are preliminary  and upon coder review may  be revised to meet current compliance requirements. Lionel December, MD Lionel December, MD 05/30/2020 12:01:15 PM This report has been signed electronically. Number of Addenda: 0

## 2020-05-30 NOTE — Anesthesia Procedure Notes (Signed)
Date/Time: 05/30/2020 11:21 AM Performed by: Julian Reil, CRNA Pre-anesthesia Checklist: Patient identified, Emergency Drugs available, Suction available and Patient being monitored Patient Re-evaluated:Patient Re-evaluated prior to induction Oxygen Delivery Method: Nasal cannula Induction Type: IV induction Placement Confirmation: positive ETCO2

## 2020-05-31 LAB — SURGICAL PATHOLOGY

## 2020-06-04 ENCOUNTER — Encounter (HOSPITAL_COMMUNITY): Payer: Self-pay | Admitting: Internal Medicine

## 2020-06-07 DIAGNOSIS — C884 Extranodal marginal zone B-cell lymphoma of mucosa-associated lymphoid tissue [MALT-lymphoma]: Secondary | ICD-10-CM | POA: Diagnosis not present

## 2020-06-07 DIAGNOSIS — C8599 Non-Hodgkin lymphoma, unspecified, extranodal and solid organ sites: Secondary | ICD-10-CM | POA: Diagnosis not present

## 2020-06-16 IMAGING — MR MR ORBITS WO/W CM
4 of 7 series · 19 of 48 positions shown · IV contrast (gadavist)
Comparison: PET-CT performed on June 17, 2019 reviewed

CLINICAL DATA: Clinical stage III MALT lymphoma

EXAM:
MRI OF THE ORBITS WITHOUT AND WITH CONTRAST
TECHNIQUE: Multiplanar, multisequence MR imaging of the orbits was performed
both before and after the administration of intravenous contrast.
CONTRAST: IS:
CONTRAST: IS
10mL GADAVIST GADOBUTROL 1 MMOL/ML IV SOLN

[Series 4: T2 fat-sat · axial · 3.0mm · 0.35mm/px · z∈[-33,+13]mm · 3 of 15 slices shown (1 of 2)]
[im 1/15]
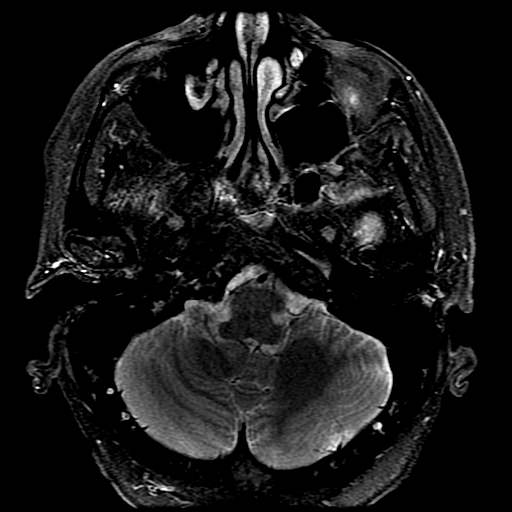
[im 8/15]
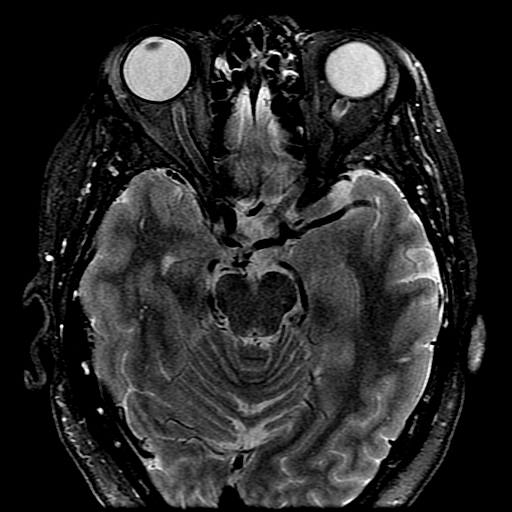
[im 15/15]
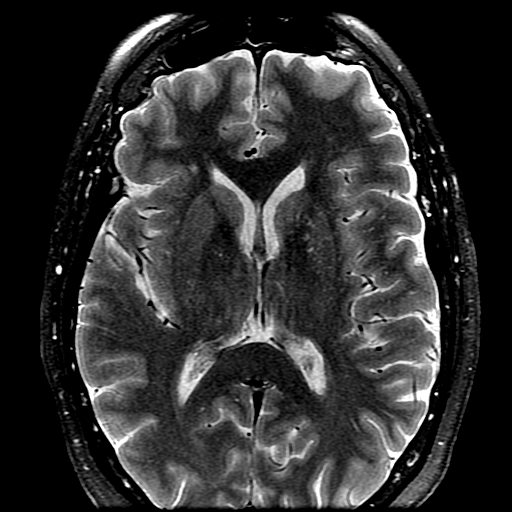

[Series 6: T2 fat-sat · coronal · 3.0mm · 0.35mm/px · 3 of 30 slices shown (2 of 2)]
[im 4/30]
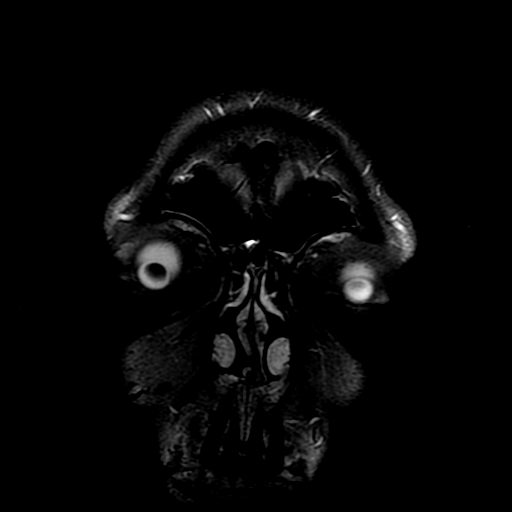
[im 15/30]
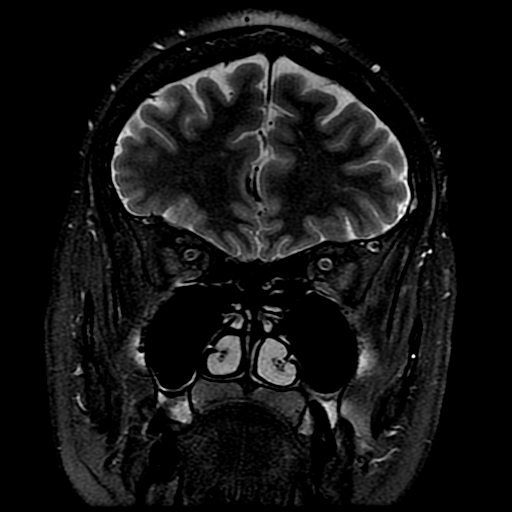
[im 26/30]
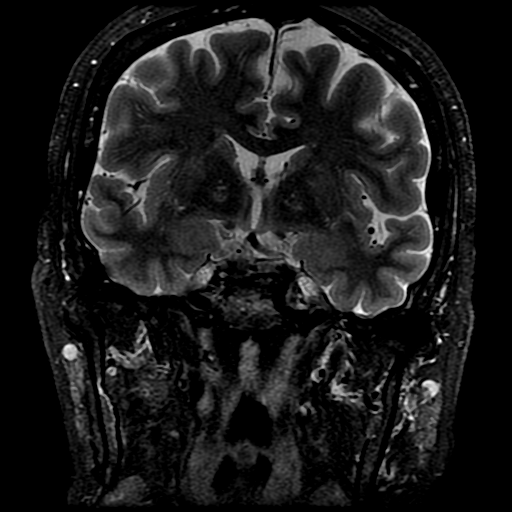

[Series 9: T1 post-contrast · coronal · 3.0mm · 0.35mm/px · 9 of 30 slices shown (1 of 2)]
[im 1/30]
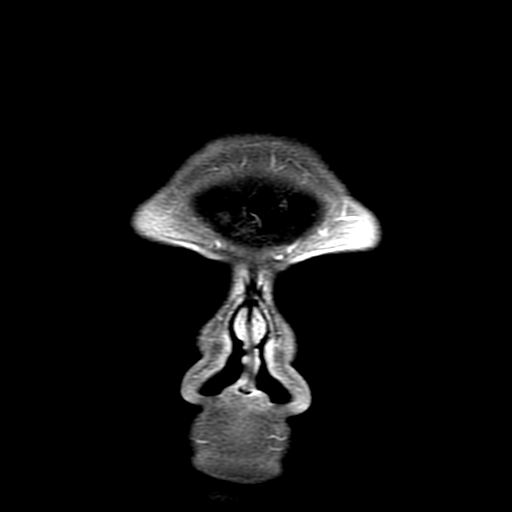
[im 4/30]
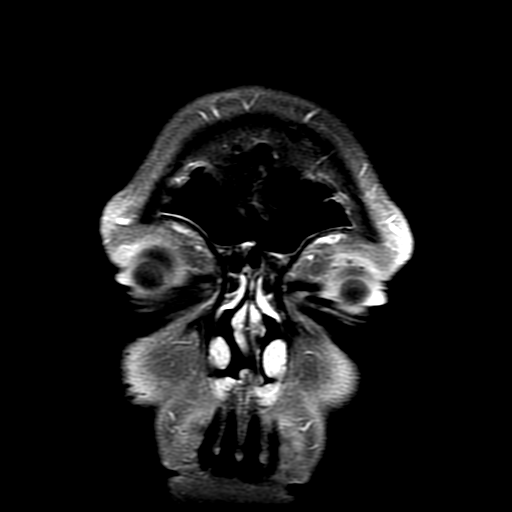
[im 8/30]
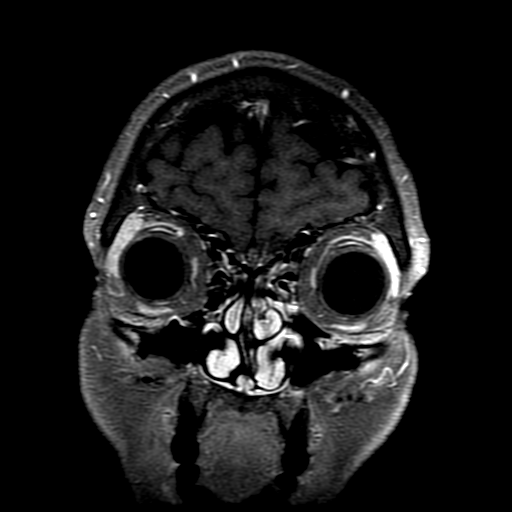
[im 11/30]
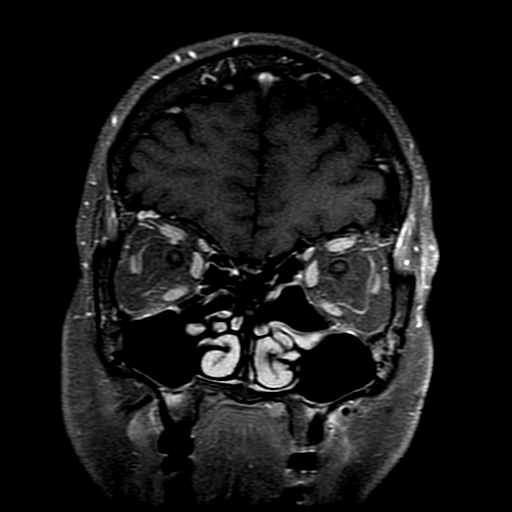
[im 15/30]
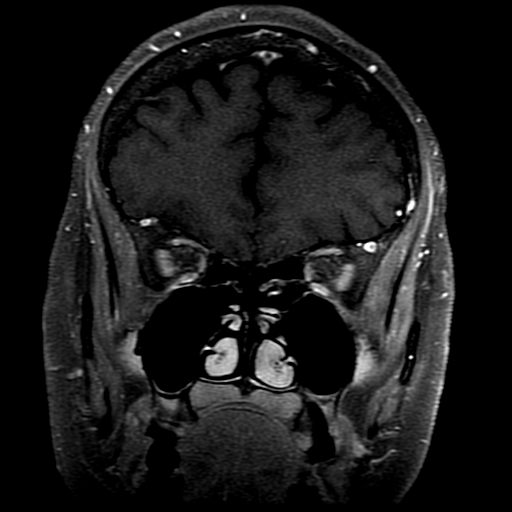
[im 19/30]
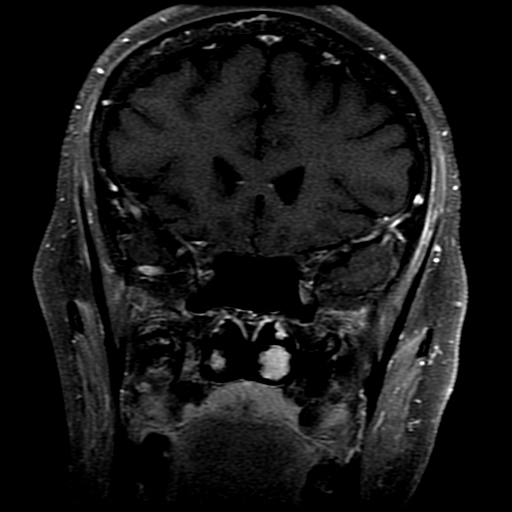
[im 22/30]
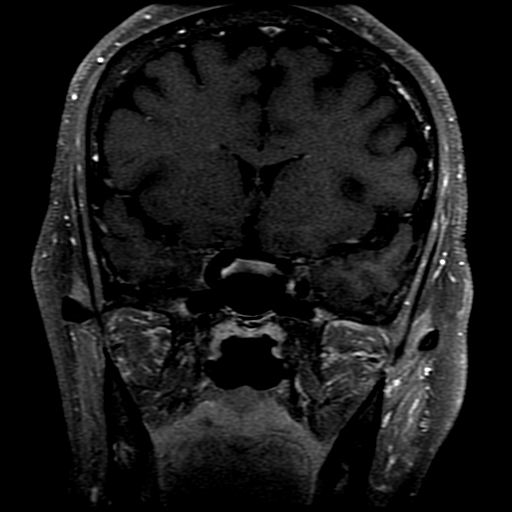
[im 26/30]
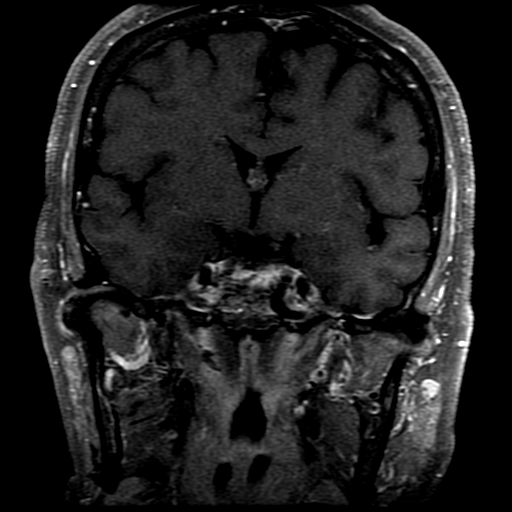
[im 30/30]
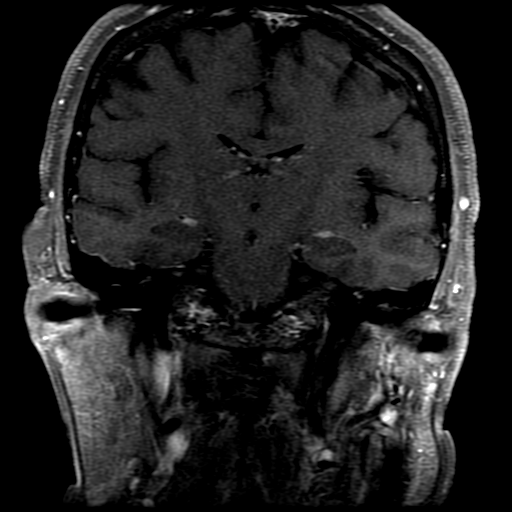

[Series 10: T1 post-contrast · axial · 3.0mm · 0.35mm/px · z∈[-33,+13]mm · 4 of 15 slices shown (2 of 2)]
[im 1/15]
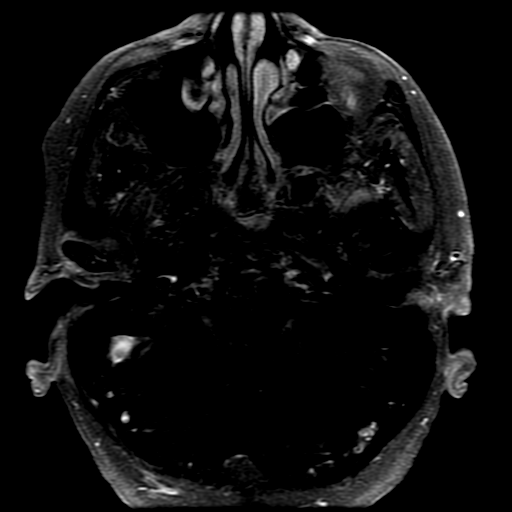
[im 4/15]
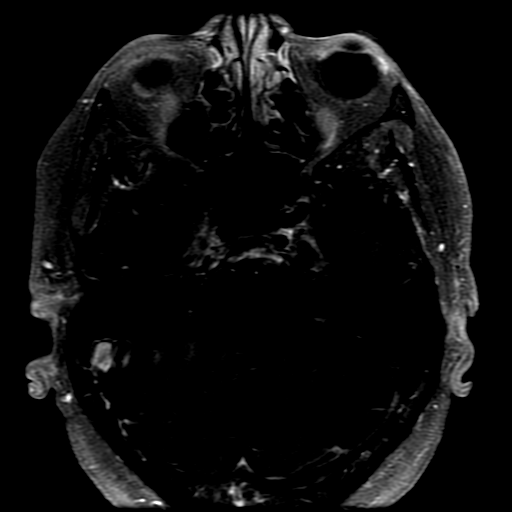
[im 8/15]
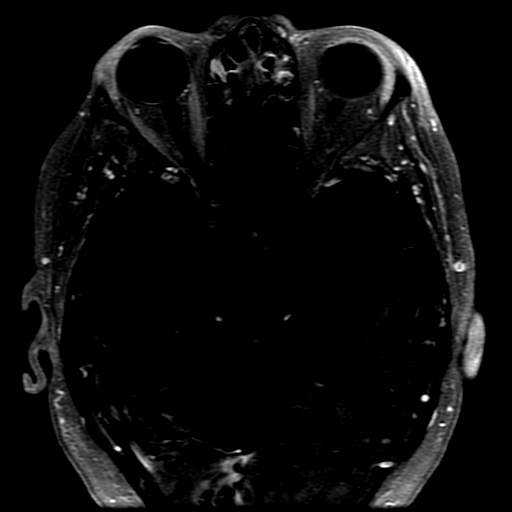
[im 15/15]
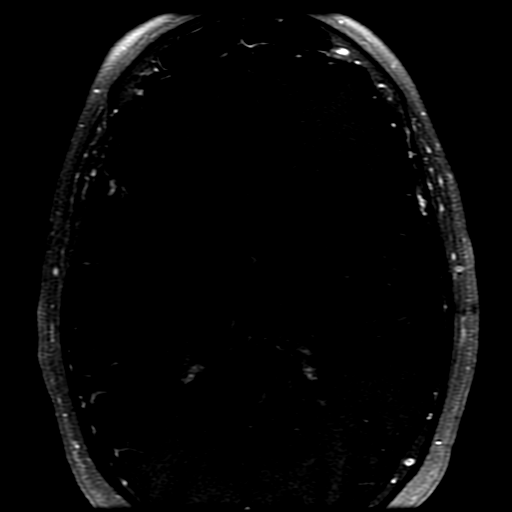

[19 of 48 positions shown; findings below may reference images not displayed]

FINDINGS: The globes are normal in size and signal characteristics. The optic
nerves, intraconal and extraconal fat, extra-ocular musculatures and
lacrimal glands appear normal. No abnormal contrast enhancement
seen. Orbital uptake seen on prior PET-CT may be related to
extra-occular musculature which can be seen in the setting of Klever
ophthalmopathy. However, extraocular muscles are normal in size in
the current study.

Visualized portions of the brain are unremarkable. Remainder of the
visualized extracranial structures appear normal.
IMPRESSION: Normal MRI of the orbits.

## 2020-06-17 ENCOUNTER — Encounter: Payer: Self-pay | Admitting: Family Medicine

## 2020-06-18 ENCOUNTER — Encounter (INDEPENDENT_AMBULATORY_CARE_PROVIDER_SITE_OTHER): Payer: Self-pay | Admitting: *Deleted

## 2020-06-19 ENCOUNTER — Telehealth: Payer: BC Managed Care – PPO | Admitting: Family Medicine

## 2020-07-16 ENCOUNTER — Telehealth (INDEPENDENT_AMBULATORY_CARE_PROVIDER_SITE_OTHER): Payer: BC Managed Care – PPO | Admitting: Family Medicine

## 2020-07-16 ENCOUNTER — Other Ambulatory Visit: Payer: Self-pay

## 2020-07-16 ENCOUNTER — Encounter: Payer: Self-pay | Admitting: Family Medicine

## 2020-07-16 VITALS — BP 121/84 | HR 90 | Temp 97.1°F | Ht 67.0 in | Wt 208.4 lb

## 2020-07-16 DIAGNOSIS — I10 Essential (primary) hypertension: Secondary | ICD-10-CM

## 2020-07-16 MED ORDER — ALBUTEROL SULFATE HFA 108 (90 BASE) MCG/ACT IN AERS: 2.0000 | INHALATION_SPRAY | Freq: Four times a day (QID) | RESPIRATORY_TRACT | 3 refills | Status: DC | PRN

## 2020-07-17 NOTE — Progress Notes (Signed)
Unable to connect with pt, rescheduled

## 2020-07-18 ENCOUNTER — Encounter: Payer: Self-pay | Admitting: Family Medicine

## 2020-07-18 ENCOUNTER — Other Ambulatory Visit: Payer: Self-pay

## 2020-07-18 ENCOUNTER — Telehealth (INDEPENDENT_AMBULATORY_CARE_PROVIDER_SITE_OTHER): Payer: Medicare Other | Admitting: Family Medicine

## 2020-07-18 VITALS — BP 120/70 | HR 85 | Temp 96.8°F | Ht 67.0 in | Wt 208.4 lb

## 2020-07-18 DIAGNOSIS — M25511 Pain in right shoulder: Secondary | ICD-10-CM | POA: Insufficient documentation

## 2020-07-18 DIAGNOSIS — I10 Essential (primary) hypertension: Secondary | ICD-10-CM

## 2020-07-18 DIAGNOSIS — G8929 Other chronic pain: Secondary | ICD-10-CM | POA: Diagnosis not present

## 2020-07-18 DIAGNOSIS — M25512 Pain in left shoulder: Secondary | ICD-10-CM | POA: Diagnosis not present

## 2020-07-18 DIAGNOSIS — M25661 Stiffness of right knee, not elsewhere classified: Secondary | ICD-10-CM

## 2020-07-18 DIAGNOSIS — E785 Hyperlipidemia, unspecified: Secondary | ICD-10-CM | POA: Diagnosis not present

## 2020-07-18 DIAGNOSIS — E669 Obesity, unspecified: Secondary | ICD-10-CM

## 2020-07-18 MED ORDER — EPINEPHRINE 0.3 MG/0.3ML IJ SOAJ: 0.3000 mg | INTRAMUSCULAR | 2 refills | Status: AC | PRN

## 2020-07-18 NOTE — Assessment & Plan Note (Signed)
Controlled, no change in medication DASH diet and commitment to daily physical activity for a minimum of 30 minutes discussed and encouraged, as a part of hypertension management. The importance of attaining a healthy weight is also discussed.  BP/Weight 07/18/2020 07/16/2020 05/30/2020 05/29/2020 05/02/2020 03/27/2020 10/12/8020  Systolic BP 336 122 449 753 005 110 211  Diastolic BP 70 84 90 84 80 80 80  Wt. (Lbs) 208.4 208.4 205.47 205.4 211 211.4 212  BMI 32.64 32.64 32.18 32.17 33.05 33.11 33.2

## 2020-07-18 NOTE — Assessment & Plan Note (Signed)
Right greater than left with limited mobility, new onset but over 3 months duration, X rays and tylenol as needed

## 2020-07-18 NOTE — Assessment & Plan Note (Addendum)
Recent flare of right knee stiffness, has had fluid drawn from right knee by Dr Marlou Sa in the past, wants to wait on Rheumatology June appointment to determine if Ortho involvement needed

## 2020-07-18 NOTE — Progress Notes (Addendum)
Virtual Visit via Telephone Note  I connected with [name] on 07/18/20 at  8:20 AM EST by telephone and verified that I am speaking with the correct person using two identifiers.  Location: Patient: home Provider: office   I discussed the limitations, risks, security and privacy concerns of performing an evaluation and management service by telephone and the availability of in person appointments. I also discussed with the patient that there may be a patient responsible charge related to this service. The patient expressed understanding and agreed to proceed.   History of Present Illness: Bilateral shoulder right worse than left  pain aggravated by radiation treatment since she had to hold them above her head, up to an 8, otherwise 1 to 2, relieved by tylenol, no direct injury Increased right knee stiffness , has benefited from aspiration in the past but is holding off currently and will await re evaluation by Rheumaology Has question as to when safe to return to [facility],  Fibroma popped on its own  So less pain in the area Needs to wear arched shoes to reduce foot pain Denies recent fever or chills. ROS : otherwise negative, no concerns voiced F/U chronic problems and address any new or current concerns. Review and update medications and allergies. Review recent lab and radiologic data . Update routine health maintainace. Review an encourage improved health habits to include nutrition, exercise and  sleep .        Observations/Objective: BP 120/70   Pulse 85   Temp (!) 96.8 F (36 C) (Oral)   Ht 5\' 7"  (1.702 m)   Wt 208 lb 6.4 oz (94.5 kg)   SpO2 98%   BMI 32.64 kg/m  Good communication with no confusion and intact memory. Alert and oriented x 3 No signs of respiratory distress during speech    Assessment and Plan:  Stiffness of right knee Recent flare of right knee stiffness, has had fluid drawn from right knee by Dr Marlou Sa in the past, wants to wait on  Rheumatology June appointment to determine if Ortho involvement needed  Essential hypertension Controlled, no change in medication DASH diet and commitment to daily physical activity for a minimum of 30 minutes discussed and encouraged, as a part of hypertension management. The importance of attaining a healthy weight is also discussed.  BP/Weight 07/18/2020 07/16/2020 05/30/2020 05/29/2020 05/02/2020 03/27/2020 0/98/1191  Systolic BP 478 295 621 308 657 846 962  Diastolic BP 70 84 90 84 80 80 80  Wt. (Lbs) 208.4 208.4 205.47 205.4 211 211.4 212  BMI 32.64 32.64 32.18 32.17 33.05 33.11 33.2       Obesity (BMI 30-39.9)  Patient re-educated about  the importance of commitment to a  minimum of 150 minutes of exercise per week as able.  The importance of healthy food choices with portion control discussed, as well as eating regularly and within a 12 hour window most days. The need to choose "clean , green" food 50 to 75% of the time is discussed, as well as to make water the primary drink and set a goal of 64 ounces water daily.    Weight /BMI 07/18/2020 07/16/2020 05/30/2020  WEIGHT 208 lb 6.4 oz 208 lb 6.4 oz 205 lb 7.5 oz  HEIGHT 5\' 7"  5\' 7"  5\' 7"   BMI 32.64 kg/m2 32.64 kg/m2 32.18 kg/m2      Hyperlipidemia LDL goal <100 neeeds to lower fat in diet which is reviewed with her Continue current med dose Review in 3 months  Hyperlipidemia:Low fat diet discussed and encouraged.   Lipid Panel  Lab Results  Component Value Date   CHOL 200 03/27/2020   HDL 64 03/27/2020   LDLCALC 114 (H) 03/27/2020   TRIG 109 03/27/2020   CHOLHDL 3.1 03/27/2020       Bilateral shoulder pain Right greater than left with limited mobility, new onset but over 3 months duration, X rays and tylenol as needed   Follow Up Instructions:    I discussed the assessment and treatment plan with the patient. The patient was provided an opportunity to ask questions and all were answered. The patient agreed with  the plan and demonstrated an understanding of the instructions.   The patient was advised to call back or seek an in-person evaluation if the symptoms worsen or if the condition fails to improve as anticipated.  I provided 22 minutes of non-face-to-face time during this encounter.   Tula Nakayama, MD

## 2020-07-18 NOTE — Assessment & Plan Note (Signed)
neeeds to lower fat in diet which is reviewed with her Continue current med dose Review in 3 months Hyperlipidemia:Low fat diet discussed and encouraged.   Lipid Panel  Lab Results  Component Value Date   CHOL 200 03/27/2020   HDL 64 03/27/2020   LDLCALC 114 (H) 03/27/2020   TRIG 109 03/27/2020   CHOLHDL 3.1 03/27/2020

## 2020-07-18 NOTE — Assessment & Plan Note (Signed)
  Patient re-educated about  the importance of commitment to a  minimum of 150 minutes of exercise per week as able.  The importance of healthy food choices with portion control discussed, as well as eating regularly and within a 12 hour window most days. The need to choose "clean , green" food 50 to 75% of the time is discussed, as well as to make water the primary drink and set a goal of 64 ounces water daily.    Weight /BMI 07/18/2020 07/16/2020 05/30/2020  WEIGHT 208 lb 6.4 oz 208 lb 6.4 oz 205 lb 7.5 oz  HEIGHT 5\' 7"  5\' 7"  5\' 7"   BMI 32.64 kg/m2 32.64 kg/m2 32.18 kg/m2

## 2020-07-18 NOTE — Patient Instructions (Addendum)
F/u in 6 months, call if you need me before   Please get X ray of both shoulders  I will not refer you to Dr Marlou Sa re right knee unless I hear from you, following up with Dr Trudie Reed in June is appropriate  I recommend you wait on your own inner spirit to determine when you return to [facility], and gradually   No change in medication  Fasting lipid, cmp and eGFr [facility] rd [facility] the [facility]  It is important that you exercise regularly at least 30 minutes 5 times a week. If you develop chest pain, have severe difficulty breathing, or feel very tired, stop exercising immediately and seek medical attention    Shoulder Range of Motion Exercises Shoulder range of motion (ROM) exercises are done to keep the shoulder moving freely or to increase movement. They are often recommended for people who have shoulder pain or stiffness or who are recovering from a shoulder surgery. Phase 1 exercises When you are able, do this exercise 1-2 times per day for 30-60 seconds in each direction, or as directed by your health care provider. Pendulum exercise To do this exercise while sitting: 1. Sit in a chair or at the edge of your bed with your feet flat on the floor. 2. Let your affected arm hang down in front of you over the edge of the bed or chair. 3. Relax your shoulder, arm, and hand. [place] your body so your arm gently swings in small circles. You can also use your unaffected arm to start the motion. 5. Repeat changing the direction of the circles, swinging your arm left and right, and swinging your arm forward and back. To do this exercise while standing: 1. Stand next to a sturdy chair or table, and hold on to it with your hand on your unaffected side. 2. Bend forward at the waist. 3. Bend your knees slightly. 4. Relax your shoulder, arm, and hand. 5. While keeping your shoulder relaxed, use body motion to swing your arm in small circles. 6. Repeat changing the direction of the circles, swinging your arm  left and right, and swinging your arm forward and back. 7. Between exercises, stand up tall and take a short break to relax your lower back.   Phase 2 exercises Do these exercises 1-2 times per day or as told by your health care provider. Hold each stretch for 30 seconds, and repeat 3 times. Do the exercises with one or both arms as instructed by your health care provider. For these exercises, sit at a table with your hand and arm supported by the table. A chair that slides easily or has wheels can be helpful. External rotation 1. Turn your chair so that your affected side is nearest to the table. 2. Place your forearm on the table to your side. Bend your elbow about 90 at the elbow (right angle) and place your hand palm facing down on the table. Your elbow should be about 6 inches away from your side. 3. Keeping your arm on the table, lean your body forward. Abduction 1. Turn your chair so that your affected side is nearest to the table. 2. Place your forearm and hand on the table so that your thumb points toward the ceiling and your arm is straight out to your side. 3. Slide your hand out to the side and away from you, using your unaffected arm to do the work. 4. To increase the stretch, you can slide your chair away  from the table. Flexion: forward stretch 1. Sit facing the table. Place your hand and elbow on the table in front of you. 2. Slide your hand forward and away from you, using your unaffected arm to do the work. 3. To increase the stretch, you can slide your chair backward. Phase 3 exercises Do these exercises 1-2 times per day or as told by your health care provider. Hold each stretch for 30 seconds, and repeat 3 times. Do the exercises with one or both arms as instructed by your health care provider. Cross-body stretch: posterior capsule stretch 1. Lift your arm straight out in front of you. 2. Bend your arm 90 at the elbow (right angle) so your forearm moves across your  body. 3. Use your other arm to gently pull the elbow across your body, toward your other shoulder. Wall climbs 1. Stand with your affected arm extended out to the side with your hand resting on a door frame. 2. Slide your hand slowly up the door frame. 3. To increase the stretch, step through the door frame. Keep your body upright and do not lean. Wand exercises You will need a cane, a piece of PVC pipe, or a sturdy wooden dowel for wand exercises. Flexion To do this exercise while standing: 1. Hold the wand with both of your hands, palms down. 2. Using the other arm to help, lift your arms up and over your head, if able. 3. Push upward with your other arm to gently increase the stretch. To do this exercise while lying down: 1. Lie on your back with your elbows resting on the floor and the wand in both your hands. Your hands will be palm down, or pointing toward your feet. 2. Lift your hands toward the ceiling, using your unaffected arm to help if needed. 3. Bring your arms overhead as able, using your unaffected arm to help if needed. Internal rotation 1. Stand while holding the wand behind you with both hands. Your unaffected arm should be extended above your head with the arm of the affected side extended behind you at the level of your waist. The wand should be pointing straight up and down as you hold it. 2. Slowly pull the wand up behind your back by straightening the elbow of your unaffected arm and bending the elbow of your affected arm. External rotation 1. Lie on your back with your affected upper arm supported on a small pillow or rolled towel. When you first do this exercise, keep your upper arm close to your body. Over time, bring your arm up to a 90 angle out to the side. 2. Hold the wand across your stomach and with both hands palm up. Your elbow on your affected side should be bent at a 90 angle. 3. Use your unaffected side to help push your forearm away from you and toward  the floor. Keep your elbow on your affected side bent at a 90 angle. Contact a health care provider if you have:  New or increasing pain.  New numbness, tingling, weakness, or discoloration in your arm or hand. This information is not intended to replace advice given to you by your health care provider. Make sure you discuss any questions you have with your health care provider. Document Revised: 06/24/2017 Document Reviewed: 06/24/2017 Elsevier Patient Education  2021 [employer].

## 2020-07-20 ENCOUNTER — Other Ambulatory Visit: Payer: Self-pay | Admitting: Family Medicine

## 2020-07-20 ENCOUNTER — Other Ambulatory Visit: Payer: Self-pay

## 2020-07-20 ENCOUNTER — Ambulatory Visit (HOSPITAL_COMMUNITY)
Admission: RE | Admit: 2020-07-20 | Discharge: 2020-07-20 | Disposition: A | Discharge status: Home / Self Care | Payer: BC Managed Care – PPO | Source: Ambulatory Visit | Attending: Family Medicine | Admitting: Family Medicine

## 2020-07-20 ENCOUNTER — Encounter: Payer: Self-pay | Admitting: Family Medicine

## 2020-07-20 DIAGNOSIS — M25511 Pain in right shoulder: Secondary | ICD-10-CM | POA: Diagnosis not present

## 2020-07-20 DIAGNOSIS — I700 Atherosclerosis of aorta: Secondary | ICD-10-CM | POA: Diagnosis not present

## 2020-07-20 DIAGNOSIS — M19011 Primary osteoarthritis, right shoulder: Secondary | ICD-10-CM | POA: Diagnosis not present

## 2020-07-20 DIAGNOSIS — G8929 Other chronic pain: Secondary | ICD-10-CM | POA: Diagnosis not present

## 2020-07-20 DIAGNOSIS — M25512 Pain in left shoulder: Secondary | ICD-10-CM | POA: Insufficient documentation

## 2020-07-20 DIAGNOSIS — M19012 Primary osteoarthritis, left shoulder: Secondary | ICD-10-CM | POA: Diagnosis not present

## 2020-07-20 MED ORDER — ALENDRONATE SODIUM 70 MG PO TABS: 70.0000 mg | ORAL_TABLET | ORAL | 3 refills | Status: DC

## 2020-09-17 DIAGNOSIS — H18413 Arcus senilis, bilateral: Secondary | ICD-10-CM | POA: Diagnosis not present

## 2020-09-17 DIAGNOSIS — H2513 Age-related nuclear cataract, bilateral: Secondary | ICD-10-CM | POA: Diagnosis not present

## 2020-09-17 DIAGNOSIS — Z79899 Other long term (current) drug therapy: Secondary | ICD-10-CM | POA: Diagnosis not present

## 2020-09-17 DIAGNOSIS — H35372 Puckering of macula, left eye: Secondary | ICD-10-CM | POA: Diagnosis not present

## 2020-09-17 DIAGNOSIS — M0689 Other specified rheumatoid arthritis, multiple sites: Secondary | ICD-10-CM | POA: Diagnosis not present

## 2020-09-24 ENCOUNTER — Encounter: Payer: Self-pay | Admitting: Family Medicine

## 2020-09-24 ENCOUNTER — Other Ambulatory Visit: Payer: Self-pay

## 2020-09-24 DIAGNOSIS — R7303 Prediabetes: Secondary | ICD-10-CM

## 2020-09-24 DIAGNOSIS — F17210 Nicotine dependence, cigarettes, uncomplicated: Secondary | ICD-10-CM

## 2020-09-24 DIAGNOSIS — R252 Cramp and spasm: Secondary | ICD-10-CM

## 2020-09-24 DIAGNOSIS — E785 Hyperlipidemia, unspecified: Secondary | ICD-10-CM

## 2020-09-25 ENCOUNTER — Inpatient Hospital Stay (HOSPITAL_COMMUNITY): Payer: Medicare Other | Attending: Hematology

## 2020-09-25 ENCOUNTER — Other Ambulatory Visit: Payer: Self-pay

## 2020-09-25 ENCOUNTER — Inpatient Hospital Stay (HOSPITAL_COMMUNITY): Payer: BC Managed Care – PPO

## 2020-09-25 DIAGNOSIS — M069 Rheumatoid arthritis, unspecified: Secondary | ICD-10-CM | POA: Diagnosis not present

## 2020-09-25 DIAGNOSIS — Z8572 Personal history of non-Hodgkin lymphomas: Secondary | ICD-10-CM | POA: Diagnosis not present

## 2020-09-25 DIAGNOSIS — Z923 Personal history of irradiation: Secondary | ICD-10-CM | POA: Diagnosis not present

## 2020-09-25 DIAGNOSIS — C884 Extranodal marginal zone B-cell lymphoma of mucosa-associated lymphoid tissue [MALT-lymphoma]: Secondary | ICD-10-CM

## 2020-09-25 DIAGNOSIS — Z79899 Other long term (current) drug therapy: Secondary | ICD-10-CM | POA: Insufficient documentation

## 2020-09-25 LAB — CBC WITH DIFFERENTIAL/PLATELET
Abs Immature Granulocytes: 0.02 10*3/uL (ref 0.00–0.07)
Basophils Absolute: 0.1 10*3/uL (ref 0.0–0.1)
Basophils Relative: 1 %
Eosinophils Absolute: 0.3 10*3/uL (ref 0.0–0.5)
Eosinophils Relative: 4 %
HCT: 40.9 % (ref 36.0–46.0)
Hemoglobin: 13.1 g/dL (ref 12.0–15.0)
Immature Granulocytes: 0 %
Lymphocytes Relative: 27 %
Lymphs Abs: 1.9 10*3/uL (ref 0.7–4.0)
MCH: 28.2 pg (ref 26.0–34.0)
MCHC: 32 g/dL (ref 30.0–36.0)
MCV: 88 fL (ref 80.0–100.0)
Monocytes Absolute: 0.6 10*3/uL (ref 0.1–1.0)
Monocytes Relative: 8 %
Neutro Abs: 4.4 10*3/uL (ref 1.7–7.7)
Neutrophils Relative %: 60 %
Platelets: 264 10*3/uL (ref 150–400)
RBC: 4.65 MIL/uL (ref 3.87–5.11)
RDW: 15.5 % (ref 11.5–15.5)
WBC: 7.2 10*3/uL (ref 4.0–10.5)
nRBC: 0 % (ref 0.0–0.2)

## 2020-09-25 LAB — COMPREHENSIVE METABOLIC PANEL
ALT: 18 U/L (ref 0–44)
AST: 20 U/L (ref 15–41)
Albumin: 3.6 g/dL (ref 3.5–5.0)
Alkaline Phosphatase: 63 U/L (ref 38–126)
Anion gap: 6 (ref 5–15)
BUN: 14 mg/dL (ref 8–23)
CO2: 27 mmol/L (ref 22–32)
Calcium: 8.7 mg/dL — ABNORMAL LOW (ref 8.9–10.3)
Chloride: 105 mmol/L (ref 98–111)
Creatinine, Ser: 0.9 mg/dL (ref 0.44–1.00)
GFR, Estimated: >60 mL/min (ref >60)
Glucose, Bld: 97 mg/dL (ref 70–99)
Potassium: 4.3 mmol/L (ref 3.5–5.1)
Sodium: 138 mmol/L (ref 135–145)
Total Bilirubin: 0.5 mg/dL (ref 0.3–1.2)
Total Protein: 7.1 g/dL (ref 6.5–8.1)

## 2020-09-25 LAB — LACTATE DEHYDROGENASE: LDH: 131 U/L (ref 98–192)

## 2020-10-02 ENCOUNTER — Other Ambulatory Visit: Payer: Self-pay

## 2020-10-02 ENCOUNTER — Inpatient Hospital Stay (HOSPITAL_BASED_OUTPATIENT_CLINIC_OR_DEPARTMENT_OTHER): Payer: Medicare Other | Admitting: Hematology

## 2020-10-02 VITALS — BP 130/83 | HR 83 | Temp 99.0°F | Resp 16 | Wt 207.0 lb

## 2020-10-02 DIAGNOSIS — M069 Rheumatoid arthritis, unspecified: Secondary | ICD-10-CM | POA: Diagnosis not present

## 2020-10-02 DIAGNOSIS — Z79899 Other long term (current) drug therapy: Secondary | ICD-10-CM | POA: Diagnosis not present

## 2020-10-02 DIAGNOSIS — C8590 Non-Hodgkin lymphoma, unspecified, unspecified site: Secondary | ICD-10-CM

## 2020-10-02 DIAGNOSIS — Z8572 Personal history of non-Hodgkin lymphomas: Secondary | ICD-10-CM | POA: Diagnosis not present

## 2020-10-02 DIAGNOSIS — Z923 Personal history of irradiation: Secondary | ICD-10-CM | POA: Diagnosis not present

## 2020-10-02 NOTE — Progress Notes (Signed)
[place], [place] [address]   CLINIC:  Medical Oncology/Hematology  PCP:  Fayrene Helper, MD [address], [place] / [place] [address]  [phone]  REASON FOR VISIT:  Follow-up for MALT lymphoma  PRIOR THERAPY: none  CURRENT THERAPY: Surveillance  INTERVAL HISTORY:  Ms. STARLA DELLER, a 70 y.o. female, returns for routine follow-up for her MALT lymphoma. Kynli was last seen on 03/27/2020.  Today she reports feeling well. She denies any fevers, chills, infections,or weight loss in the past 6 months. She reports RA irritated in wrist and shoulders which worsened with colder weather. She is taking plaquenil for RA. She reports stable cataract in left eye, and says it does not affect vision. She reports mild tenderness in left breast.   REVIEW OF SYSTEMS:  Review of Systems  Constitutional: Negative for appetite change and fatigue.  Neurological: Positive for numbness (numbness and burning in feet).  All other systems reviewed and are negative.   PAST MEDICAL/SURGICAL HISTORY:  Past Medical History:  Diagnosis Date  . Acute left-sided back pain with sciatica 11/27/2018  . Annual physical exam 04/12/2015  . Arthritis    Phreesia 05/01/2020  . Arthritis, rheumatoid ([facility]) 1993   hx  . Cancer ([facility])    Phreesia 05/01/2020  . Collagen vascular disease (HCC)    RA  . COPD (chronic obstructive pulmonary disease) ([facility])   . Hyperlipidemia   . Hypertension   . Kidney stone 1999  . Non Hodgkin's lymphoma ([facility])   . Obesity   . Osteoporosis 2019   intolerant of fosamax due to jaw pain  . Porphyria ([facility])    hx    Past Surgical History:  Procedure Laterality Date  . APPENDECTOMY  1983  . BREAST BIOPSY Left    diagnosid with non hogdkins lymphoma  . COLONOSCOPY WITH PROPOFOL N/A 05/30/2020   Procedure: COLONOSCOPY WITH PROPOFOL;  Surgeon: Rogene Houston, MD;  [facility]: [facility];  Service: Endoscopy;  Laterality: N/A;   10:30  . LITHOTRIPSY  2000   rt renal stone   . POLYPECTOMY  05/30/2020   Procedure: POLYPECTOMY;  Surgeon: Rogene Houston, MD;  Location: [facility];  Service: Endoscopy;;    SOCIAL HISTORY:  Social History   Socioeconomic History  . Marital status: Single    Spouse name: Not on file  . Number of children: 0  . Years of education: 12+  . Highest education level: Doctorate  Occupational History  . Occupation: Retired; Teacher part time currently  Tobacco Use  . Smoking status: Former Smoker    Packs/day: 0.50    Types: Cigarettes    Quit date: 05/25/2020    Years since quitting: 0.3  . Smokeless tobacco: Never Used  . Tobacco comment: only has one occasionally  Vaping Use  . Vaping Use: Never used  Substance and Sexual Activity  . Alcohol use: No  . Drug use: No  . Sexual activity: Not Currently  Other Topics Concern  . Not on file  Social History Narrative   Lives alone    Social Determinants of Health   Financial Resource Strain: Low Risk   . Difficulty of Paying Living Expenses: Not hard at all  Food Insecurity: No Food Insecurity  . Worried About Charity fundraiser in the Last Year: Never true  . Ran Out of Food in the Last Year: Never true  Transportation Needs: No Transportation Needs  . Lack  of Transportation (Medical): No  . Lack of Transportation (Non-Medical): No  Physical Activity: Insufficiently Active  . Days of Exercise per Week: 3 days  . Minutes of Exercise per Session: 30 min  Stress: No Stress Concern Present  . Feeling of Stress : Not at all  Social Connections: Moderately Isolated  . Frequency of Communication with Friends and Family: More than three times a week  . Frequency of Social Gatherings with Friends and Family: More than three times a week  . Attends Religious Services: More than 4 times per year  . Active Member of Clubs or Organizations: No  . Attends Banker Meetings: Never  . Marital Status: Separated   Intimate Partner Violence: Not At Risk  . Fear of Current or Ex-Partner: No  . Emotionally Abused: No  . Physically Abused: No  . Sexually Abused: No    FAMILY HISTORY:  Family History  Problem Relation Age of Onset  . Arthritis Mother   . Hypertension Mother   . Hypertension Sister   . Arthritis Sister   . Hypertension Sister   . Arthritis Sister   . Ulcerative colitis Sister   . Hypertension Sister   . Arthritis Sister   . Stroke Maternal Grandmother   . Arthritis Maternal Grandfather   . Arthritis Paternal Grandmother   . Stroke Paternal Grandfather     CURRENT MEDICATIONS:  Current Outpatient Medications  Medication Sig Dispense Refill  . alendronate (FOSAMAX) 70 MG tablet Take 1 tablet (70 mg total) by mouth every 7 (seven) days. Take with a full glass of water on an empty stomach. 12 tablet 3  . acetaminophen (TYLENOL) 500 MG tablet Take 1,000 mg by mouth every 6 (six) hours as needed for moderate pain.    Marland Kitchen albuterol (VENTOLIN HFA) 108 (90 Base) MCG/ACT inhaler Inhale 2 puffs into the lungs every 6 (six) hours as needed for wheezing or shortness of breath. 1 each 3  . aspirin 81 MG EC tablet Take 1 tablet (81 mg total) by mouth daily. 30 tablet 12  . Calcium Carbonate-Vitamin D 600-400 MG-UNIT tablet Take 1 tablet by mouth daily.    . Cholecalciferol (VITAMIN D3) 5000 units TABS Take 5,000 Units by mouth daily.    . Coenzyme Q10 100 MG capsule Take 100 mg by mouth 3 (three) times daily.    Marland Kitchen EPINEPHrine 0.3 mg/0.3 mL IJ SOAJ injection Inject 0.3 mg into the muscle as needed for anaphylaxis. 1 each 2  . fluticasone (FLONASE) 50 MCG/ACT nasal spray Place 2 sprays into both nostrils daily. (Patient taking differently: Place 2 sprays into both nostrils daily as needed for allergies.) 48 g 1  . gabapentin (NEURONTIN) 100 MG capsule Take 1 capsule (100 mg total) by mouth 2 (two) times daily. (Patient taking differently: Take 100 mg by mouth at bedtime.) 180 capsule 2  .  hydroxychloroquine (PLAQUENIL) 200 MG tablet Take 400 mg by mouth daily.     Marland Kitchen loratadine (CLARITIN) 10 MG tablet Take 10 mg by mouth daily.    Marland Kitchen losartan (COZAAR) 50 MG tablet Take 1 tablet (50 mg total) by mouth daily. 90 tablet 3  . montelukast (SINGULAIR) 10 MG tablet Take 1 tablet (10 mg total) by mouth at bedtime. 90 tablet 3  . Multiple Vitamins-Minerals (CENTRUM SILVER PO) Take 1 tablet by mouth daily.    . Omega-3 Fatty Acids (FISH OIL) 1000 MG CAPS Take 1 capsule (1,000 mg total) by mouth daily.  0  . omeprazole (PRILOSEC)  20 MG capsule Take 20 mg by mouth daily.    . pravastatin (PRAVACHOL) 10 MG tablet TAKE 1 TABLET DAILY STOP   TAKING CRESTOR (Patient taking differently: Take 10 mg by mouth 4 (four) times a week.) 90 tablet 3  . Turmeric 500 MG CAPS Take 1 capsule by mouth.    . vitamin B-12 (CYANOCOBALAMIN) 50 MCG tablet Take 50 mcg by mouth daily.    . Zinc 50 MG TABS Take 50 mg by mouth daily.     No current facility-administered medications for this visit.    ALLERGIES:  Allergies  Allergen Reactions  . Septra [Sulfamethoxazole-Trimethoprim] Dermatitis  . Barbiturates Other (See Comments)    Prophyria   . Chlordiazepoxide Other (See Comments)    unknown  . Hydrocodone-Acetaminophen Hives and Other (See Comments)  . Pentothal [Thiopental]     Hives?  . Phenytoin Other (See Comments)    Prophyria   . Rofecoxib Other (See Comments)    Prophyria   . Cefuroxime Axetil Rash  . Other   . Wellbutrin [Bupropion] Palpitations    PHYSICAL EXAM:  Performance status (ECOG): 1 - Symptomatic but completely ambulatory  There were no vitals filed for this visit. Wt Readings from Last 3 Encounters:  07/18/20 208 lb 6.4 oz (94.5 kg)  07/16/20 208 lb 6.4 oz (94.5 kg)  05/30/20 205 lb 7.5 oz (93.2 kg)   Physical Exam Vitals reviewed.  Constitutional:      Appearance: Normal appearance. She is obese.  Cardiovascular:     Rate and Rhythm: Normal rate and regular rhythm.      Pulses: Normal pulses.     Heart sounds: Normal heart sounds.  Pulmonary:     Effort: Pulmonary effort is normal.     Breath sounds: Normal breath sounds.  Chest:  Breasts:     Right: No axillary adenopathy or supraclavicular adenopathy.     Left: Tenderness present. No mass, axillary adenopathy or supraclavicular adenopathy.    Abdominal:     Palpations: Abdomen is soft. There is no hepatomegaly, splenomegaly or mass.     Tenderness: There is no abdominal tenderness.     Hernia: No hernia is present.  Musculoskeletal:     Right lower leg: No edema.     Left lower leg: No edema.  Lymphadenopathy:     Upper Body:     Right upper body: No supraclavicular, axillary or pectoral adenopathy.     Left upper body: No supraclavicular, axillary or pectoral adenopathy.  Neurological:     General: No focal deficit present.     Mental Status: She is alert and oriented to person, place, and time.  Psychiatric:        Mood and Affect: Mood normal.        Behavior: Behavior normal.     LABORATORY DATA:  I have reviewed the labs as listed.  CBC Latest Ref Rng & Units 09/25/2020 03/27/2020 10/05/2019  WBC 4.0 - 10.5 K/uL 7.2 8.6 7.1  Hemoglobin 12.0 - 15.0 g/dL 13.1 13.1 13.8  Hematocrit 36.0 - 46.0 % 40.9 41.2 43.9  Platelets 150 - 400 K/uL 264 240 281   CMP Latest Ref Rng & Units 09/25/2020 03/27/2020 10/05/2019  Glucose 70 - 99 mg/dL 97 100(H) 84  BUN 8 - 23 mg/dL 14 17 16   Creatinine 0.44 - 1.00 mg/dL 0.90 0.93 1.02(H)  Sodium 135 - 145 mmol/L 138 136 140  Potassium 3.5 - 5.1 mmol/L 4.3 3.9 4.8  Chloride 98 - 111  mmol/L 105 102 103  CO2 22 - 32 mmol/L 27 25 28   Calcium 8.9 - 10.3 mg/dL 8.7(L) 9.5 9.3  Total Protein 6.5 - 8.1 g/dL 7.1 7.8 7.7  Total Bilirubin 0.3 - 1.2 mg/dL 0.5 0.4 0.5  Alkaline Phos 38 - 126 U/L 63 57 76  AST 15 - 41 U/L 20 22 23   ALT 0 - 44 U/L 18 19 24       Component Value Date/Time   RBC 4.65 09/25/2020 0856   MCV 88.0 09/25/2020 0856   MCH 28.2 09/25/2020  0856   MCHC 32.0 09/25/2020 0856   RDW 15.5 09/25/2020 0856   LYMPHSABS 1.9 09/25/2020 0856   MONOABS 0.6 09/25/2020 0856   EOSABS 0.3 09/25/2020 0856   BASOSABS 0.1 09/25/2020 0856    DIAGNOSTIC IMAGING:  I have independently reviewed the scans and discussed with the patient. No results found.   ASSESSMENT:  1. Clinical stage III MALT lymphoma: -Mammogram on May 12, 2019 showed left breast mass. -Biopsy consistent with non-Hodgkin's lymphoma, specifically extranodal marginal zone lymphoma of MALT with plasmacytic differentiation. -She did not have any B symptoms. PET scan on 06/17/2019 showed mild hypermetabolic area in the left breast at the site of known lymphoma. Intense FDG uptake within the retrobulbar and periorbital soft tissues without signs of visible soft tissue mass or lesion. Mildly hypermetabolic lymph nodes in the external iliac chains bilaterally. Asymmetry of the tonsillar tissue with increased activity on the left side. MRI of the orbits did not show any mass. -She completed radiation therapy to the left breast mass end of March 2021. -PET CT scan on 03/06/2020 showed improvement at site of prior biopsy in the left upper breast, SUV 1.2, previously 4.4.  Stable mildly prominent bilateral external iliac lymph nodes with no new adenopathy.   2. Rheumatoid arthritis: -She was diagnosed in her 64s. Currently on Plaquenil.   PLAN:  1. Clinical stage III MALT lymphoma: -Denies any fevers, night sweats or weight loss in the last 6 months. - Left breast upper inner quadrant nodule is not felt on today's exam.  No supraclavicular axillary or inguinal adenopathy. - Reviewed labs from 09/25/2020 which showed normal LDH and CBC. - Recommend follow-up in 6 months with repeat PET CT scan.  2. Rheumatoid arthritis: -She has shoulder pains from rheumatoid arthritis.  She is continuing Plaquenil.  Orders placed this encounter:  No orders of the defined types were placed  in this encounter.    Derek Jack, MD [place] [phone]   I, Thana Ates, am acting as a scribe for Dr. Sanda Linger.  I, Derek Jack MD, have reviewed the above documentation for accuracy and completeness, and I agree with the above.

## 2020-10-02 NOTE — Patient Instructions (Addendum)
[place] at [facility] Discharge Instructions  You were seen today by Dr. Delton Coombes. He went over your recent results. You will be scheduled for a PET scan prior to your next visit.  Dr. Delton Coombes will see you back in 6 months for labs and follow up.   Thank you for choosing [place] at [facility] to provide your oncology and hematology care.  To afford each patient quality time with our provider, please arrive at least 15 minutes before your scheduled appointment time.   If you have a lab appointment with the [facility] please come in thru the Main Entrance and check in at the main information desk  You need to re-schedule your appointment should you arrive 10 or more minutes late.  We strive to give you quality time with our providers, and arriving late affects you and other patients whose appointments are after yours.  Also, if you no show three or more times for appointments you may be dismissed from the clinic at the providers discretion.     Again, thank you for choosing [facility].  Our hope is that these requests will decrease the amount of time that you wait before being seen by our physicians.       _____________________________________________________________  Should you have questions after your visit to [facility], please contact our office at [phone] between the hours of 8:00 a.m. and 4:30 p.m.  Voicemails left after 4:00 p.m. will not be returned until the following business day.  For prescription refill requests, have your pharmacy contact our office and allow 72 hours.    Cancer Center Support Programs:   > Cancer Support Group  2nd Tuesday of the month 1pm-2pm, Journey Room

## 2020-11-13 DIAGNOSIS — Z79899 Other long term (current) drug therapy: Secondary | ICD-10-CM | POA: Diagnosis not present

## 2020-11-13 DIAGNOSIS — M150 Primary generalized (osteo)arthritis: Secondary | ICD-10-CM | POA: Diagnosis not present

## 2020-11-13 DIAGNOSIS — M2550 Pain in unspecified joint: Secondary | ICD-10-CM | POA: Diagnosis not present

## 2020-11-13 DIAGNOSIS — H209 Unspecified iridocyclitis: Secondary | ICD-10-CM | POA: Diagnosis not present

## 2020-11-13 DIAGNOSIS — R252 Cramp and spasm: Secondary | ICD-10-CM | POA: Diagnosis not present

## 2020-11-13 DIAGNOSIS — M0579 Rheumatoid arthritis with rheumatoid factor of multiple sites without organ or systems involvement: Secondary | ICD-10-CM | POA: Diagnosis not present

## 2021-01-11 ENCOUNTER — Ambulatory Visit (INDEPENDENT_AMBULATORY_CARE_PROVIDER_SITE_OTHER): Payer: Medicare Other | Admitting: Family Medicine

## 2021-01-11 ENCOUNTER — Other Ambulatory Visit: Payer: Self-pay

## 2021-01-11 ENCOUNTER — Encounter: Payer: Self-pay | Admitting: Family Medicine

## 2021-01-11 VITALS — BP 138/84 | HR 92 | Resp 16 | Ht 67.0 in | Wt 210.0 lb

## 2021-01-11 DIAGNOSIS — I10 Essential (primary) hypertension: Secondary | ICD-10-CM

## 2021-01-11 DIAGNOSIS — E785 Hyperlipidemia, unspecified: Secondary | ICD-10-CM

## 2021-01-11 DIAGNOSIS — E559 Vitamin D deficiency, unspecified: Secondary | ICD-10-CM | POA: Diagnosis not present

## 2021-01-11 DIAGNOSIS — E669 Obesity, unspecified: Secondary | ICD-10-CM | POA: Diagnosis not present

## 2021-01-11 DIAGNOSIS — R7303 Prediabetes: Secondary | ICD-10-CM | POA: Diagnosis not present

## 2021-01-11 DIAGNOSIS — Z13820 Encounter for screening for osteoporosis: Secondary | ICD-10-CM | POA: Diagnosis not present

## 2021-01-11 DIAGNOSIS — Z1231 Encounter for screening mammogram for malignant neoplasm of breast: Secondary | ICD-10-CM | POA: Diagnosis not present

## 2021-01-11 NOTE — Patient Instructions (Signed)
Annual exam in office end October, call if you need me sooner  Fasting lipid, TSH and vit D 5 to 7 days before visit  Flu vaccine in September  Please schedule mammogram at [facility] , also needs dexa, scheduled re evaluate osteoporosis  Please get both shingrix vaccines within the next 8 months  Blood pressure is excellent, also recent June labs from Rheumatology  We will send for June labs from Dr Trudie Reed  It is important that you exercise regularly at least 30 minutes 5 times a week. If you develop chest pain, have severe difficulty breathing, or feel very tired, stop exercising immediately and seek medical attention    Thanks for choosing [facility], we consider it a privelige to serve you.

## 2021-01-13 ENCOUNTER — Encounter: Payer: Self-pay | Admitting: Family Medicine

## 2021-01-13 NOTE — Assessment & Plan Note (Signed)
Controlled, no change in medication DASH diet and commitment to daily physical activity for a minimum of 30 minutes discussed and encouraged, as a part of hypertension management. The importance of attaining a healthy weight is also discussed.  BP/Weight 01/11/2021 10/02/2020 07/18/2020 07/16/2020 05/30/2020 05/29/2020 [id number]  Systolic BP 0000000 [id number]  Diastolic BP 84 83 70 84 90 84 80  Wt. (Lbs) 210 207 208.4 208.4 205.47 205.4 211  BMI 32.89 32.42 32.64 32.64 32.18 32.17 33.05

## 2021-01-13 NOTE — Assessment & Plan Note (Signed)
Hyperlipidemia:Low fat diet discussed and encouraged.   Lipid Panel  Lab Results  Component Value Date   CHOL 200 03/27/2020   HDL 64 03/27/2020   LDLCALC 114 (H) 03/27/2020   TRIG 109 03/27/2020   CHOLHDL 3.1 03/27/2020   Updated lab needed at/ before next visit. Needs to reduce dietary fat

## 2021-01-13 NOTE — Progress Notes (Signed)
[name]     MRN: [id number]      DOB: [birth year]   HPI [name] is here for follow up and re-evaluation of chronic medical conditions, medication management and review of any available recent lab and radiology data.  Preventive health is updated, specifically  Cancer screening and Immunization.   Has upcoming dental work next week, will not need GA , but rescheduled her follow up as the dates conflicted The PT denies any adverse reactions to current medications since the last visit.  There are no new concerns.  There are no specific complaints   ROS Denies recent fever or chills. Denies sinus pressure, nasal congestion, ear pain or sore throat. Denies chest congestion, productive cough or wheezing. Denies chest pains, palpitations and leg swelling Denies abdominal pain, nausea, vomiting,diarrhea or constipation.   Denies dysuria, frequency, hesitancy or incontinence. Denies uncontrolled  joint pain, swelling and limitation in mobility. Denies headaches, seizures, numbness, or tingling. Denies depression, anxiety or insomnia. Denies skin break down or rash.   PE  BP 138/84   Pulse 92   Resp 16   Ht '5\' 7"'$  (1.702 m)   Wt 210 lb (95.3 kg)   SpO2 98%   BMI 32.89 kg/m   Patient alert and oriented and in no cardiopulmonary distress.  HEENT: No facial asymmetry, EOMI,     Neck supple .  Chest: Clear to auscultation bilaterally.  CVS: S1, S2 no murmurs, no S3.Regular rate.  ABD: Soft non tender.   Ext: No edema  MS: Adequate ROM spine, shoulders, hips and knees.  Skin: Intact, no ulcerations or rash noted.  Psych: Good eye contact, normal affect. Memory intact not anxious or depressed appearing.  CNS: CN 2-12 intact, power,  normal throughout.no focal deficits noted.   Assessment & Plan  Essential hypertension Controlled, no change in medication DASH diet and commitment to daily physical activity for a minimum of 30 minutes discussed and encouraged, as a  part of hypertension management. The importance of attaining a healthy weight is also discussed.  BP/Weight 01/11/2021 10/02/2020 07/18/2020 07/16/2020 05/30/2020 05/29/2020 [id number]  Systolic BP 0000000 [id number]  Diastolic BP 84 83 70 84 90 84 80  Wt. (Lbs) 210 207 208.4 208.4 205.47 205.4 211  BMI 32.89 32.42 32.64 32.64 32.18 32.17 33.05       Hyperlipidemia LDL goal <100 Hyperlipidemia:Low fat diet discussed and encouraged.   Lipid Panel  Lab Results  Component Value Date   CHOL 200 03/27/2020   HDL 64 03/27/2020   LDLCALC 114 (H) 03/27/2020   TRIG 109 03/27/2020   CHOLHDL 3.1 03/27/2020   Updated lab needed at/ before next visit. Needs to reduce dietary fat    Prediabetes Patient educated about the importance of limiting  Carbohydrate intake , the need to commit to daily physical activity for a minimum of 30 minutes , and to commit weight loss. The fact that changes in all these areas will reduce or eliminate all together the development of diabetes is stressed.   Diabetic Labs Latest Ref Rng & Units 09/25/2020 03/27/2020 10/05/2019 07/28/2019 06/07/2019  HbA1c <5.7 % of total Hgb - - - - -  Chol 0 - 200 mg/dL - 200 - - -  HDL >40 mg/dL - 64 - - -  Calc LDL 0 - 99 mg/dL - 114(H) - - -  Triglycerides <150 mg/dL - 109 - - -  Creatinine 0.44 - 1.00 mg/dL 0.90 0.93 1.02(H) 1.00  0.79   BP/Weight 01/11/2021 10/02/2020 07/18/2020 07/16/2020 05/30/2020 05/29/2020 [id number]  Systolic BP 0000000 [id number]  Diastolic BP 84 83 70 84 90 84 80  Wt. (Lbs) 210 207 208.4 208.4 205.47 205.4 211  BMI 32.89 32.42 32.64 32.64 32.18 32.17 33.05   No flowsheet data found.  Updated lab needed at/ before next visit.   Obesity (BMI 30-39.9)  Patient re-educated about  the importance of commitment to a  minimum of 150 minutes of exercise per week as able.  The importance of healthy food choices with portion control discussed, as well as eating regularly and within a 12 hour window  most days. The need to choose "clean , green" food 50 to 75% of the time is discussed, as well as to make water the primary drink and set a goal of 64 ounces water daily.    Weight /BMI 01/11/2021 10/02/2020 07/18/2020  WEIGHT 210 lb 207 lb 208 lb 6.4 oz  HEIGHT '5\' 7"'$  - '5\' 7"'$   BMI 32.89 kg/m2 32.42 kg/m2 32.64 kg/m2

## 2021-01-13 NOTE — Assessment & Plan Note (Signed)
  Patient re-educated about  the importance of commitment to a  minimum of 150 minutes of exercise per week as able.  The importance of healthy food choices with portion control discussed, as well as eating regularly and within a 12 hour window most days. The need to choose "clean , green" food 50 to 75% of the time is discussed, as well as to make water the primary drink and set a goal of 64 ounces water daily.    Weight /BMI 01/11/2021 10/02/2020 07/18/2020  WEIGHT 210 lb 207 lb 208 lb 6.4 oz  HEIGHT '5\' 7"'$  - '5\' 7"'$   BMI 32.89 kg/m2 32.42 kg/m2 32.64 kg/m2

## 2021-01-13 NOTE — Assessment & Plan Note (Signed)
Patient educated about the importance of limiting  Carbohydrate intake , the need to commit to daily physical activity for a minimum of 30 minutes , and to commit weight loss. The fact that changes in all these areas will reduce or eliminate all together the development of diabetes is stressed.   Diabetic Labs Latest Ref Rng & Units 09/25/2020 03/27/2020 10/05/2019 07/28/2019 06/07/2019  HbA1c <5.7 % of total Hgb - - - - -  Chol 0 - 200 mg/dL - 200 - - -  HDL >40 mg/dL - 64 - - -  Calc LDL 0 - 99 mg/dL - 114(H) - - -  Triglycerides <150 mg/dL - 109 - - -  Creatinine 0.44 - 1.00 mg/dL 0.90 0.93 1.02(H) 1.00 0.79   BP/Weight 01/11/2021 10/02/2020 07/18/2020 07/16/2020 05/30/2020 05/29/2020 [id number]  Systolic BP 0000000 [id number]  Diastolic BP 84 83 70 84 90 84 80  Wt. (Lbs) 210 207 208.4 208.4 205.47 205.4 211  BMI 32.89 32.42 32.64 32.64 32.18 32.17 33.05   No flowsheet data found.  Updated lab needed at/ before next visit.

## 2021-01-16 ENCOUNTER — Ambulatory Visit: Payer: BC Managed Care – PPO | Admitting: Family Medicine

## 2021-01-23 ENCOUNTER — Telehealth: Payer: Self-pay | Admitting: *Deleted

## 2021-01-23 NOTE — Chronic Care Management (AMB) (Signed)
  Chronic Care Management   Note  01/23/2021 Name: [name] MRN: [id number] DOB: 09/04/1950  [name] is a 70 y.o. year old female who is a primary care patient of Moshe Cipro [place] Levo, MD. I reached out to [name] by phone today in response to a referral sent by [name] PCP, Dr. Moshe Cipro.      [name] was given information about Chronic Care Management services today including:  CCM service includes personalized support from designated clinical staff supervised by her physician, including individualized plan of care and coordination with other care providers 24/7 contact phone numbers for assistance for urgent and routine care needs. Service will only be billed when office clinical staff spend 20 minutes or more in a month to coordinate care. Only one practitioner may furnish and bill the service in a calendar month. The patient may stop CCM services at any time (effective at the end of the month) by phone call to the office staff. The patient will be responsible for cost sharing (co-pay) of up to 20% of the service fee (after annual deductible is met).  Patient agreed to services and verbal consent obtained.   Follow up plan: Telephone appointment with care management team member scheduled for:01/29/21  [facility]  Direct Dial: [phone]

## 2021-01-29 ENCOUNTER — Ambulatory Visit (INDEPENDENT_AMBULATORY_CARE_PROVIDER_SITE_OTHER): Payer: Medicare Other | Admitting: *Deleted

## 2021-01-29 DIAGNOSIS — E785 Hyperlipidemia, unspecified: Secondary | ICD-10-CM

## 2021-01-29 DIAGNOSIS — I10 Essential (primary) hypertension: Secondary | ICD-10-CM

## 2021-01-29 NOTE — Chronic Care Management (AMB) (Signed)
Chronic Care Management   CCM RN Visit Note  01/29/2021 Name: [name] MRN: [id number] DOB: [birth year]  Subjective: [name] is a 70 y.o. year old female who is a primary care patient of Moshe Cipro [place] Levo, MD. The care management team was consulted for assistance with disease management and care coordination needs.    Engaged with patient by telephone for initial visit in response to provider referral for case management and/or care coordination services.   Consent to Services:  The patient was given the following information about Chronic Care Management services today, agreed to services, and gave verbal consent: 1. CCM service includes personalized support from designated clinical staff supervised by the primary care provider, including individualized plan of care and coordination with other care providers 2. 24/7 contact phone numbers for assistance for urgent and routine care needs. 3. Service will only be billed when office clinical staff spend 20 minutes or more in a month to coordinate care. 4. Only one practitioner may furnish and bill the service in a calendar month. 5.The patient may stop CCM services at any time (effective at the end of the month) by phone call to the office staff. 6. The patient will be responsible for cost sharing (co-pay) of up to 20% of the service fee (after annual deductible is met). Patient agreed to services and consent obtained.  Patient agreed to services and verbal consent obtained.   Assessment: Review of patient past medical history, allergies, medications, health status, including review of consultants reports, laboratory and other test data, was performed as part of comprehensive evaluation and provision of chronic care management services.   SDOH (Social Determinants of Health) assessments and interventions performed:  SDOH Interventions    Flowsheet Row Most Recent Value  SDOH Interventions   Food Insecurity Interventions Intervention  Not Indicated  Transportation Interventions Intervention Not Indicated        CCM Care Plan  Allergies  Allergen Reactions   Septra [Sulfamethoxazole-Trimethoprim] Dermatitis   Barbiturates Other (See Comments)    Prophyria    Chlordiazepoxide Other (See Comments)    unknown   Hydrocodone-Acetaminophen Hives and Other (See Comments)   Pentothal [Thiopental]     Hives?   Phenytoin Other (See Comments)    Prophyria    Rofecoxib Other (See Comments)    Prophyria    Cefuroxime Axetil Rash   Other    Wellbutrin [Bupropion] Palpitations    Outpatient Encounter Medications as of 01/29/2021  Medication Sig   acetaminophen (TYLENOL) 500 MG tablet Take 1,000 mg by mouth every 6 (six) hours as needed for moderate pain.   albuterol (VENTOLIN HFA) 108 (90 Base) MCG/ACT inhaler Inhale 2 puffs into the lungs every 6 (six) hours as needed for wheezing or shortness of breath.   aspirin 81 MG EC tablet Take 1 tablet (81 mg total) by mouth daily.   Calcium Carbonate-Vitamin D 600-400 MG-UNIT tablet Take 1 tablet by mouth daily.   Cholecalciferol (VITAMIN D3) 5000 units TABS Take 5,000 Units by mouth daily.   Coenzyme Q10 100 MG capsule Take 100 mg by mouth 3 (three) times daily.   EPINEPHrine 0.3 mg/0.3 mL IJ SOAJ injection Inject 0.3 mg into the muscle as needed for anaphylaxis.   fluticasone (FLONASE) 50 MCG/ACT nasal spray Place 2 sprays into both nostrils daily. (Patient taking differently: Place 2 sprays into both nostrils daily as needed for allergies.)   gabapentin (NEURONTIN) 100 MG capsule Take 1 capsule (100 mg total) by mouth 2 (two)  times daily. (Patient taking differently: Take 100 mg by mouth at bedtime.)   hydroxychloroquine (PLAQUENIL) 200 MG tablet Take 400 mg by mouth daily.    loratadine (CLARITIN) 10 MG tablet Take 10 mg by mouth daily.   losartan (COZAAR) 50 MG tablet Take 1 tablet (50 mg total) by mouth daily.   montelukast (SINGULAIR) 10 MG tablet Take 1 tablet (10 mg  total) by mouth at bedtime.   Multiple Vitamins-Minerals (CENTRUM SILVER PO) Take 1 tablet by mouth daily.   Omega-3 Fatty Acids (FISH OIL) 1000 MG CAPS Take 1 capsule (1,000 mg total) by mouth daily.   omeprazole (PRILOSEC) 20 MG capsule Take 20 mg by mouth at bedtime.   pravastatin (PRAVACHOL) 10 MG tablet TAKE 1 TABLET DAILY STOP   TAKING CRESTOR (Patient taking differently: Take 10 mg by mouth 4 (four) times a week.)   Turmeric 500 MG CAPS Take 1 capsule by mouth.   vitamin B-12 (CYANOCOBALAMIN) 50 MCG tablet Take 50 mcg by mouth daily.   Zinc 50 MG TABS Take 50 mg by mouth daily.   alendronate (FOSAMAX) 70 MG tablet Take 1 tablet (70 mg total) by mouth every 7 (seven) days. Take with a full glass of water on an empty stomach. (Patient not taking: No sig reported)   No facility-administered encounter medications on file as of 01/29/2021.    Patient Active Problem List   Diagnosis Date Noted   Bilateral shoulder pain 07/18/2020   Stiffness of right knee 07/18/2020   Insomnia due to anxiety and fear 07/03/2019   MALT lymphoma ([place]) 06/07/2019   Osteoporosis 04/28/2018   Abnormal CT scan, chest 06/10/2016   Rheumatoid arthritis ([place]) 05/13/2016   GERD (gastroesophageal reflux disease) 03/24/2014   Obesity (BMI 30-39.9) 16/02/9603   Metabolic syndrome X 54/01/8118   Prediabetes 09/28/2011   Allergic rhinitis 09/04/2009   Hyperlipidemia LDL goal <100 08/12/2007   Essential hypertension 08/12/2007    Conditions to be addressed/monitored:HTN and HLD  Care Plan : Hypertension (Adult)  Updates made by Kassie Mends, RN since 01/29/2021 12:00 AM     Problem: Hypertension (Hypertension)   Priority: Medium     Long-Range Goal: Hypertension Monitored   Start Date: 01/29/2021  Expected End Date: 07/29/2021  This Visit's Progress: On track  Priority: Medium  Note:   Objective:  Last practice recorded BP readings:  BP Readings from Last 3 Encounters:  01/11/21 138/84  10/02/20 130/83   07/18/20 120/70  Patient reports she lives alone and has sisters nearby who can assist her if needed, pt still drives, is independent with all aspects of her care, has cane to use if needed, pt reports she has arthritis and this hinders her ability to exercise, she walks in her house sometimes and also stretches.  Patient checks blood pressure 2-3 times per month, has advanced directives in place. Most recent eGFR/CrCl: No results found for: EGFR  No components found for: CRCL Current Barriers:  Knowledge Deficits related to basic understanding of hypertension pathophysiology and self care management-reinforcement of checking blood pressure regularly, low sodium diet Case Manager Clinical Goal(s):  patient will verbalize understanding of plan for hypertension management patient will attend all scheduled medical appointments: Dr. Moshe Cipro 10/28 and Annual Wellness visit 12/14 patient will demonstrate improved adherence to prescribed treatment plan for hypertension as evidenced by taking all medications as prescribed, monitoring and recording blood pressure as directed, adhering to low sodium/DASH diet Interventions:  Collaboration with Fayrene Helper, MD regarding development and update  of comprehensive plan of care as evidenced by provider attestation and co-signature Inter-disciplinary care team collaboration (see longitudinal plan of care) Evaluation of current treatment plan related to hypertension self management and patient's adherence to plan as established by provider. Provided education to patient re: complications of uncontrolled blood pressure such as stroke, heart attack Education sent via My Chart- low sodium diet Reviewed medications with patient and discussed importance of compliance Discussed plans with patient for ongoing care management follow up and provided patient with direct contact information for care management team Advised patient, providing education and rationale, to  monitor blood pressure 3 x week and record, calling PCP for findings outside established parameters.  Reviewed importance of exercise if able and getting outside when you can Reviewed scheduled/upcoming provider appointments including: primary care provider 10/28 Self-Care Activities: Attends all scheduled provider appointments Calls provider office for new concerns, questions, or BP outside discussed parameters Checks BP and records as discussed Follows a low sodium diet/DASH diet Patient Goals: - check blood pressure 3 times per week - write blood pressure results in a log or diary  - follow a low sodium diet - avoid salty snacks and fast food - look over education sent via My Chart- low sodium diet - follow up with primary care provider 10/28 Follow Up Plan: Telephone follow up appointment with care management team member scheduled for:   04/23/2021    Care Plan : Hyperlipidemia  Updates made by Kassie Mends, RN since 01/29/2021 12:00 AM     Problem: Health Promotion or Disease Self-Management (Hyperlipidemia)   Priority: Medium     Long-Range Goal: Self-Management Plan Developed- Hyperlipidemia   Start Date: 01/29/2021  Expected End Date: 07/29/2021  This Visit's Progress: On track  Priority: Medium  Note:   Current Barriers:  Poorly controlled hyperlipidemia, complicated by diet, lack of consistent exercise (unable due to rheumatoid arthritis) Current antihyperlipidemic regimen: pravastatin Most recent lipid panel:     Component Value Date/Time   CHOL 200 03/27/2020 1414   TRIG 109 03/27/2020 1414   HDL 64 03/27/2020 1414   CHOLHDL 3.1 03/27/2020 1414   VLDL 22 03/27/2020 1414   LDLCALC 114 (H) 03/27/2020 1414   LDLCALC 122 (H) 11/17/2018 0743   ASCVD risk enhancing conditions: age >35, HTN RN Care Manager Clinical Goal(s):  patient will work with Consulting civil engineer, providers, and care team towards execution of optimized self-health management plan patient will  verbalize understanding of plan for management of hyperlipidemia patient will take all medications exactly as prescribed and will call provider for medication related questions patient will attend all scheduled medical appointments patient will demonstrate improved adherence to prescribed treatment plan for hyperlipidemia the patient will demonstrate ongoing self health care management ability Interventions: Collaboration with Fayrene Helper, MD regarding development and update of comprehensive plan of care as evidenced by provider attestation and co-signature Inter-disciplinary care team collaboration (see longitudinal plan of care) Medication review performed; medication list updated in electronic medical record.  Inter-disciplinary care team collaboration (see longitudinal plan of care) Referred to pharmacy team for assistance with HLD medication management Evaluation of current treatment plan related to hyperlipidemia and patient's adherence to plan as established by provider. Reviewed medications with patient and discussed importance of adherence Reviewed scheduled/upcoming provider appointments including: Dr. Moshe Cipro 10/28  AWV 12/14 Discussed plans with patient for ongoing care management follow up and provided patient with direct contact information for care management team Reviewed importance of heart healthy diet and exercise if  able Patient Goals/Self-Care Activities: - call for medicine refill 2 or 3 days before it runs out - keep a list of all the medicines I take; vitamins and herbals too - change to whole grain breads, cereal, pasta - eat 3 to 5 servings of fruits and vegetables each day - fill half the plate with nonstarchy vegetables - limit fast food meals to no more than 1 per week - manage portion size - make shared treatment decisions with doctor - spend time outdoors at least 3 times a week  - eat heart healthy diet - avoid fried foods, bake or broil instead -  take medications/ statin as prescribed Follow Up Plan: Telephone follow up appointment with care management team member scheduled for:  04/23/2021       Plan:Telephone follow up appointment with care management team member scheduled for:  04/23/2021  Jacqlyn Larsen [facility], BSN RN Case Manager [facility] [phone]

## 2021-01-29 NOTE — Patient Instructions (Signed)
Visit Information   PATIENT GOALS:   Goals Addressed             This Visit's Progress    Hyperlipidema Management       Timeframe:  Long-Range Goal Priority:  Medium Start Date:       01/29/2021                      Expected End Date:       07/29/2021                Follow Up Date 04/23/2021   - make shared treatment decisions with doctor - spend time outdoors at least 3 times a week  - eat heart healthy diet - avoid fried foods, bake or broil instead - take medications/ statin as prescribed   Why is this important?   Having a long-term illness can be scary.  It can also be stressful for you and your caregiver.  These steps may help.    Notes:      Track and Manage My Blood Pressure-Hypertension       Timeframe:  Long-Range Goal Priority:  Medium Start Date:       01/29/2021                      Expected End Date:     07/29/2021                  Follow Up Date 04/23/2021   - check blood pressure 3 times per week - write blood pressure results in a log or diary  - follow a low sodium diet - avoid salty snacks and fast food - look over education sent via My Chart- low sodium diet - follow up with primary care provider 10/28   Why is this important?   You won't feel high blood pressure, but it can still hurt your blood vessels.  High blood pressure can cause heart or kidney problems. It can also cause a stroke.  Making lifestyle changes like losing a little weight or eating less salt will help.  Checking your blood pressure at home and at different times of the day can help to control blood pressure.  If the doctor prescribes medicine remember to take it the way the doctor ordered.  Call the office if you cannot afford the medicine or if there are questions about it.     Notes:         Consent to CCM Services: Ms. Latif was given information about Chronic Care Management services including:  CCM service includes personalized support from designated clinical staff  supervised by her physician, including individualized plan of care and coordination with other care providers 24/7 contact phone numbers for assistance for urgent and routine care needs. Service will only be billed when office clinical staff spend 20 minutes or more in a month to coordinate care. Only one practitioner may furnish and bill the service in a calendar month. The patient may stop CCM services at any time (effective at the end of the month) by phone call to the office staff. The patient will be responsible for cost sharing (co-pay) of up to 20% of the service fee (after annual deductible is met).  Patient agreed to services and verbal consent obtained.   Patient verbalizes understanding of instructions provided today and agrees to view in [place].   Telephone follow up appointment with care management team member scheduled for:  11/29/2022Low-Sodium  Eating Plan Sodium, which is an element that makes up salt, helps you maintain a healthy balance of fluids in your body. Too much sodium can increase your blood pressure and cause fluid and waste to be held in your body. Your health care provider or dietitian may recommend following this plan if you have high blood pressure (hypertension), kidney disease, liver disease, or heart failure. Eating less sodium can help lower your blood pressure, reduce swelling, and protect your heart, liver, and kidneys. What are tips for following this plan? Reading food labels The Nutrition Facts label lists the amount of sodium in one serving of the food. If you eat more than one serving, you must multiply the listed amount of sodium by the number of servings. Choose foods with less than 140 mg of sodium per serving. Avoid foods with 300 mg of sodium or more per serving. Shopping  Look for lower-sodium products, often labeled as "low-sodium" or "no salt added." Always check the sodium content, even if foods are labeled as "unsalted" or "no salt  added." Buy fresh foods. Avoid canned foods and pre-made or frozen meals. Avoid canned, cured, or processed meats. Buy breads that have less than 80 mg of sodium per slice. Cooking  Eat more home-cooked food and less restaurant, buffet, and fast food. Avoid adding salt when cooking. Use salt-free seasonings or herbs instead of table salt or sea salt. Check with your health care provider or pharmacist before using salt substitutes. Cook with plant-based oils, such as canola, sunflower, or olive oil. Meal planning When eating at a restaurant, ask that your food be prepared with less salt or no salt, if possible. Avoid dishes labeled as brined, pickled, cured, smoked, or made with soy sauce, miso, or teriyaki sauce. Avoid foods that contain MSG (monosodium glutamate). MSG is sometimes added to [place] food, bouillon, and some canned foods. Make meals that can be grilled, baked, poached, roasted, or steamed. These are generally made with less sodium. General information Most people on this plan should limit their sodium intake to 1,500-2,000 mg (milligrams) of sodium each day. What foods should I eat? Fruits Fresh, frozen, or canned fruit. Fruit juice. Vegetables Fresh or frozen vegetables. "No salt added" canned vegetables. "No salt added" tomato sauce and paste. Low-sodium or reduced-sodium tomato and vegetable juice. Grains Low-sodium cereals, including oats, puffed wheat and rice, and shredded wheat. Low-sodium crackers. Unsalted rice. Unsalted pasta. Low-sodium bread. Whole-grain breads and whole-grain pasta. Meats and other proteins Fresh or frozen (no salt added) meat, poultry, seafood, and fish. Low-sodium canned tuna and salmon. Unsalted nuts. Dried peas, beans, and lentils without added salt. Unsalted canned beans. Eggs. Unsalted nut butters. Dairy Milk. Soy milk. Cheese that is naturally low in sodium, such as ricotta cheese, fresh mozzarella, or Swiss cheese. Low-sodium or  reduced-sodium cheese. Cream cheese. Yogurt. Seasonings and condiments Fresh and dried herbs and spices. Salt-free seasonings. Low-sodium mustard and ketchup. Sodium-free salad dressing. Sodium-free light mayonnaise. Fresh or refrigerated horseradish. Lemon juice. Vinegar. Other foods Homemade, reduced-sodium, or low-sodium soups. Unsalted popcorn and pretzels. Low-salt or salt-free chips. The items listed above may not be a complete list of foods and beverages you can eat. Contact a dietitian for more information. What foods should I avoid? Vegetables Sauerkraut, pickled vegetables, and relishes. Olives. [place] fries. Onion rings. Regular canned vegetables (not low-sodium or reduced-sodium). Regular canned tomato sauce and paste (not low-sodium or reduced-sodium). Regular tomato and vegetable juice (not low-sodium or reduced-sodium). Frozen vegetables in sauces. Grains Instant  hot cereals. Bread stuffing, pancake, and biscuit mixes. Croutons. Seasoned rice or pasta mixes. Noodle soup cups. Boxed or frozen macaroni and cheese. Regular salted crackers. Self-rising flour. Meats and other proteins Meat or fish that is salted, canned, smoked, spiced, or pickled. Precooked or cured meat, such as sausages or meat loaves. Berniece Salines. Ham. Pepperoni. Hot dogs. Corned beef. Chipped beef. Salt pork. Jerky. Pickled herring. Anchovies and sardines. Regular canned tuna. Salted nuts. Dairy Processed cheese and cheese spreads. Hard cheeses. Cheese curds. Blue cheese. Feta cheese. String cheese. Regular cottage cheese. Buttermilk. Canned milk. Fats and oils Salted butter. Regular margarine. Ghee. Bacon fat. Seasonings and condiments Onion salt, garlic salt, seasoned salt, table salt, and sea salt. Canned and packaged gravies. Worcestershire sauce. Tartar sauce. Barbecue sauce. Teriyaki sauce. Soy sauce, including reduced-sodium. Steak sauce. Fish sauce. Oyster sauce. Cocktail sauce. Horseradish that you find on the  shelf. Regular ketchup and mustard. Meat flavorings and tenderizers. Bouillon cubes. Hot sauce. Pre-made or packaged marinades. Pre-made or packaged taco seasonings. Relishes. Regular salad dressings. Salsa. Other foods Salted popcorn and pretzels. Corn chips and puffs. Potato and tortilla chips. Canned or dried soups. Pizza. Frozen entrees and pot pies. The items listed above may not be a complete list of foods and beverages you should avoid. Contact a dietitian for more information. Summary Eating less sodium can help lower your blood pressure, reduce swelling, and protect your heart, liver, and kidneys. Most people on this plan should limit their sodium intake to 1,500-2,000 mg (milligrams) of sodium each day. Canned, boxed, and frozen foods are high in sodium. Restaurant foods, fast foods, and pizza are also very high in sodium. You also get sodium by adding salt to food. Try to cook at home, eat more fresh fruits and vegetables, and eat less fast food and canned, processed, or prepared foods. This information is not intended to replace advice given to you by your health care provider. Make sure you discuss any questions you have with your health care provider. Document Revised: 06/17/2019 Document Reviewed: 04/13/2019 Elsevier Patient Education  2022 [facility], BSN RN Case Manager [facility] [phone]   CLINICAL CARE PLAN: Patient Care Plan: Hypertension (Adult)     Problem Identified: Hypertension (Hypertension)   Priority: Medium     Long-Range Goal: Hypertension Monitored   Start Date: 01/29/2021  Expected End Date: 07/29/2021  This Visit's Progress: On track  Priority: Medium  Note:   Objective:  Last practice recorded BP readings:  BP Readings from Last 3 Encounters:  01/11/21 138/84  10/02/20 130/83  07/18/20 120/70  Patient reports she lives alone and has sisters nearby who can assist her if needed, pt still drives, is  independent with all aspects of her care, has cane to use if needed, pt reports she has arthritis and this hinders her ability to exercise, she walks in her house sometimes and also stretches.  Patient checks blood pressure 2-3 times per month, has advanced directives in place. Most recent eGFR/CrCl: No results found for: EGFR  No components found for: CRCL Current Barriers:  Knowledge Deficits related to basic understanding of hypertension pathophysiology and self care management-reinforcement of checking blood pressure regularly, low sodium diet Case Manager Clinical Goal(s):  patient will verbalize understanding of plan for hypertension management patient will attend all scheduled medical appointments: Dr. Moshe Cipro 10/28 and Annual Wellness visit 12/14 patient will demonstrate improved adherence to prescribed treatment plan for hypertension as evidenced by taking all medications as  prescribed, monitoring and recording blood pressure as directed, adhering to low sodium/DASH diet Interventions:  Collaboration with Fayrene Helper, MD regarding development and update of comprehensive plan of care as evidenced by provider attestation and co-signature Inter-disciplinary care team collaboration (see longitudinal plan of care) Evaluation of current treatment plan related to hypertension self management and patient's adherence to plan as established by provider. Provided education to patient re: complications of uncontrolled blood pressure such as stroke, heart attack Education sent via My Chart- low sodium diet Reviewed medications with patient and discussed importance of compliance Discussed plans with patient for ongoing care management follow up and provided patient with direct contact information for care management team Advised patient, providing education and rationale, to monitor blood pressure 3 x week and record, calling PCP for findings outside established parameters.  Reviewed importance  of exercise if able and getting outside when you can Reviewed scheduled/upcoming provider appointments including: primary care provider 10/28 Self-Care Activities: Attends all scheduled provider appointments Calls provider office for new concerns, questions, or BP outside discussed parameters Checks BP and records as discussed Follows a low sodium diet/DASH diet Patient Goals: - check blood pressure 3 times per week - write blood pressure results in a log or diary  - follow a low sodium diet - avoid salty snacks and fast food - look over education sent via My Chart- low sodium diet - follow up with primary care provider 10/28 Follow Up Plan: Telephone follow up appointment with care management team member scheduled for:   04/23/2021    Patient Care Plan: Hyperlipidemia     Problem Identified: Health Promotion or Disease Self-Management (Hyperlipidemia)   Priority: Medium     Long-Range Goal: Self-Management Plan Developed- Hyperlipidemia   Start Date: 01/29/2021  Expected End Date: 07/29/2021  This Visit's Progress: On track  Priority: Medium  Note:   Current Barriers:  Poorly controlled hyperlipidemia, complicated by diet, lack of consistent exercise (unable due to rheumatoid arthritis) Current antihyperlipidemic regimen: pravastatin Most recent lipid panel:     Component Value Date/Time   CHOL 200 03/27/2020 1414   TRIG 109 03/27/2020 1414   HDL 64 03/27/2020 1414   CHOLHDL 3.1 03/27/2020 1414   VLDL 22 03/27/2020 1414   LDLCALC 114 (H) 03/27/2020 1414   LDLCALC 122 (H) 11/17/2018 0743   ASCVD risk enhancing conditions: age >45, HTN RN Care Manager Clinical Goal(s):  patient will work with Consulting civil engineer, providers, and care team towards execution of optimized self-health management plan patient will verbalize understanding of plan for management of hyperlipidemia patient will take all medications exactly as prescribed and will call provider for medication related  questions patient will attend all scheduled medical appointments patient will demonstrate improved adherence to prescribed treatment plan for hyperlipidemia the patient will demonstrate ongoing self health care management ability Interventions: Collaboration with Fayrene Helper, MD regarding development and update of comprehensive plan of care as evidenced by provider attestation and co-signature Inter-disciplinary care team collaboration (see longitudinal plan of care) Medication review performed; medication list updated in electronic medical record.  Inter-disciplinary care team collaboration (see longitudinal plan of care) Referred to pharmacy team for assistance with HLD medication management Evaluation of current treatment plan related to hyperlipidemia and patient's adherence to plan as established by provider. Reviewed medications with patient and discussed importance of adherence Reviewed scheduled/upcoming provider appointments including: Dr. Moshe Cipro 10/28  AWV 12/14 Discussed plans with patient for ongoing care management follow up and provided patient with direct contact  information for care management team Reviewed importance of heart healthy diet and exercise if able Patient Goals/Self-Care Activities: - call for medicine refill 2 or 3 days before it runs out - keep a list of all the medicines I take; vitamins and herbals too - change to whole grain breads, cereal, pasta - eat 3 to 5 servings of fruits and vegetables each day - fill half the plate with nonstarchy vegetables - limit fast food meals to no more than 1 per week - manage portion size - make shared treatment decisions with doctor - spend time outdoors at least 3 times a week  - eat heart healthy diet - avoid fried foods, bake or broil instead - take medications/ statin as prescribed Follow Up Plan: Telephone follow up appointment with care management team member scheduled for:  04/23/2021

## 2021-02-14 ENCOUNTER — Other Ambulatory Visit: Payer: Self-pay

## 2021-02-14 ENCOUNTER — Ambulatory Visit (INDEPENDENT_AMBULATORY_CARE_PROVIDER_SITE_OTHER): Payer: Medicare Other

## 2021-02-14 DIAGNOSIS — Z23 Encounter for immunization: Secondary | ICD-10-CM | POA: Diagnosis not present

## 2021-02-22 DIAGNOSIS — E785 Hyperlipidemia, unspecified: Secondary | ICD-10-CM | POA: Diagnosis not present

## 2021-02-22 DIAGNOSIS — I10 Essential (primary) hypertension: Secondary | ICD-10-CM

## 2021-03-20 DIAGNOSIS — E785 Hyperlipidemia, unspecified: Secondary | ICD-10-CM | POA: Diagnosis not present

## 2021-03-20 DIAGNOSIS — E559 Vitamin D deficiency, unspecified: Secondary | ICD-10-CM | POA: Diagnosis not present

## 2021-03-21 LAB — LIPID PANEL
Chol/HDL Ratio: 3.4 ratio (ref 0.0–4.4)
Cholesterol, Total: 186 mg/dL (ref 100–199)
HDL: 54 mg/dL (ref >39)
LDL Chol Calc (NIH): 114 mg/dL — ABNORMAL HIGH (ref 0–99)
Triglycerides: 99 mg/dL (ref 0–149)
VLDL Cholesterol Cal: 18 mg/dL (ref 5–40)

## 2021-03-21 LAB — VITAMIN D 25 HYDROXY (VIT D DEFICIENCY, FRACTURES): Vit D, 25-Hydroxy: 58.9 ng/mL (ref 30.0–100.0)

## 2021-03-21 LAB — TSH: TSH: 1.02 u[IU]/mL (ref 0.450–4.500)

## 2021-03-22 ENCOUNTER — Other Ambulatory Visit: Payer: Self-pay

## 2021-03-22 ENCOUNTER — Ambulatory Visit (INDEPENDENT_AMBULATORY_CARE_PROVIDER_SITE_OTHER): Payer: Medicare Other | Admitting: Family Medicine

## 2021-03-22 ENCOUNTER — Encounter: Payer: Self-pay | Admitting: Family Medicine

## 2021-03-22 VITALS — BP 134/80 | HR 97 | Resp 18 | Ht 67.0 in | Wt 208.0 lb

## 2021-03-22 DIAGNOSIS — Z0000 Encounter for general adult medical examination without abnormal findings: Secondary | ICD-10-CM | POA: Diagnosis not present

## 2021-03-22 MED ORDER — ALPRAZOLAM 0.25 MG PO TABS: ORAL_TABLET | ORAL | 0 refills | Status: DC

## 2021-03-22 MED ORDER — MONTELUKAST SODIUM 10 MG PO TABS: 10.0000 mg | ORAL_TABLET | Freq: Every day | ORAL | 3 refills | Status: DC

## 2021-03-22 MED ORDER — GABAPENTIN 100 MG PO CAPS: 100.0000 mg | ORAL_CAPSULE | Freq: Every day | ORAL | 3 refills | Status: DC

## 2021-03-22 MED ORDER — LOSARTAN POTASSIUM 50 MG PO TABS: 50.0000 mg | ORAL_TABLET | Freq: Every day | ORAL | 3 refills | Status: DC

## 2021-03-22 NOTE — Progress Notes (Signed)
    [name]     MRN: [id number]      DOB: August 19, 1950  HPI: Patient is in for annual physical exam. Increased anxiety as she approaches repeat PET scan Recent labs,  are reviewed. Immunization is reviewed , and is up to date.   PE: BP 134/80   Pulse 97   Resp 18   Ht 5\' 7"  (1.702 m)   Wt 208 lb 0.6 oz (94.4 kg)   SpO2 98%   BMI 32.58 kg/m   Pleasant  female, alert and oriented x 3, in no cardio-pulmonary distress. Afebrile. HEENT No facial trauma or asymetry. Sinuses non tender.  Extra occullar muscles intact.. External ears normal, . Neck: supple, no adenopathy,JVD or thyromegaly.No bruits.  Chest: Clear to ascultation bilaterally.No crackles or wheezes. Non tender to palpation  Breast: No  masses or lumps. No tenderness. No nipple discharge or inversion. No axillary or supraclavicular adenopathy  Cardiovascular system; Heart sounds normal,  S1 and  S2 ,no S3.  No murmur, or thrill. Apical beat not displaced Peripheral pulses normal.  Abdomen: Soft, non tender, no organomegaly or masses. No bruits. Bowel sounds normal. No guarding, tenderness or rebound.     Musculoskeletal exam: Decreased though adequate  ROM of spine, hips , shoulders and knees. deformity ,swelling and crepitus noted in right ankle No muscle wasting or atrophy.   Neurologic: Cranial nerves 2 to 12 intact. Power, tone ,sensation  normal throughout. No disturbance in gait. No tremor.  Skin: Intact, no ulceration, erythema , scaling or rash noted. Pigmentation normal throughout  Psych; Normal mood and affect. Judgement and concentration normal   Assessment & Plan:  Annual physical exam Annual exam as documented. Counseling done  re healthy lifestyle involving commitment to 150 minutes exercise per week, heart healthy diet, and attaining healthy weight.The importance of adequate sleep also discussed. Regular seat belt use and home safety, is also discussed. Changes in  health habits are decided on by the patient with goals and time frames  set for achieving them. Immunization and cancer screening needs are specifically addressed at this visit.

## 2021-03-22 NOTE — Patient Instructions (Addendum)
F/U in 6 months, call if you need me sooner  Please reschedule her mammogram and dexa to 12/21 or 12/23 if possible , has 12/22 appointment that she cannot change  It is important that you exercise regularly at least 30 minutes 5 times a week. If you develop chest pain, have severe difficulty breathing, or feel very tired, stop exercising immediately and seek medical attention   Think about what you will eat, plan ahead. Choose " clean, green, fresh or frozen" over canned, processed or packaged foods which are more sugary, salty and fatty. 70 to 75% of food eaten should be vegetables and fruit. Three meals at set times with snacks allowed between meals, but they must be fruit or vegetables. Aim to eat over a 12 hour period , example 7 am to 7 pm, and STOP after  your last meal of the day. Drink water,generally about 64 ounces per day, no other drink is as healthy. Fruit juice is best enjoyed in a healthy way, by EATING the fruit. Thanks for choosing [facility], we consider it a privelige to serve you.

## 2021-03-24 ENCOUNTER — Encounter: Payer: Self-pay | Admitting: Family Medicine

## 2021-03-24 NOTE — Assessment & Plan Note (Signed)

## 2021-04-03 ENCOUNTER — Ambulatory Visit (HOSPITAL_COMMUNITY)
Admission: RE | Admit: 2021-04-03 | Discharge: 2021-04-03 | Disposition: A | Discharge status: Home / Self Care | Payer: Medicare Other | Source: Ambulatory Visit | Attending: Hematology | Admitting: Hematology

## 2021-04-03 ENCOUNTER — Other Ambulatory Visit: Payer: Self-pay

## 2021-04-03 DIAGNOSIS — C8590 Non-Hodgkin lymphoma, unspecified, unspecified site: Secondary | ICD-10-CM | POA: Diagnosis not present

## 2021-04-03 DIAGNOSIS — J439 Emphysema, unspecified: Secondary | ICD-10-CM | POA: Diagnosis not present

## 2021-04-03 DIAGNOSIS — C884 Extranodal marginal zone B-cell lymphoma of mucosa-associated lymphoid tissue [MALT-lymphoma]: Secondary | ICD-10-CM | POA: Diagnosis not present

## 2021-04-03 DIAGNOSIS — K5790 Diverticulosis of intestine, part unspecified, without perforation or abscess without bleeding: Secondary | ICD-10-CM | POA: Diagnosis not present

## 2021-04-03 LAB — GLUCOSE, CAPILLARY: Glucose-Capillary: 108 mg/dL — ABNORMAL HIGH (ref 70–99)

## 2021-04-03 MED ORDER — FLUDEOXYGLUCOSE F - 18 (FDG) INJECTION
10.4000 | Freq: Once | INTRAVENOUS | Status: AC
  Administered 2021-04-03: 11:00:00 10.4 via INTRAVENOUS

## 2021-04-04 ENCOUNTER — Inpatient Hospital Stay (HOSPITAL_COMMUNITY): Payer: Medicare Other | Attending: Hematology

## 2021-04-04 DIAGNOSIS — M069 Rheumatoid arthritis, unspecified: Secondary | ICD-10-CM | POA: Diagnosis not present

## 2021-04-04 DIAGNOSIS — Z8572 Personal history of non-Hodgkin lymphomas: Secondary | ICD-10-CM | POA: Diagnosis not present

## 2021-04-04 DIAGNOSIS — C8590 Non-Hodgkin lymphoma, unspecified, unspecified site: Secondary | ICD-10-CM

## 2021-04-04 LAB — COMPREHENSIVE METABOLIC PANEL
ALT: 18 U/L (ref 0–44)
AST: 20 U/L (ref 15–41)
Albumin: 3.7 g/dL (ref 3.5–5.0)
Alkaline Phosphatase: 68 U/L (ref 38–126)
Anion gap: 8 (ref 5–15)
BUN: 11 mg/dL (ref 8–23)
CO2: 26 mmol/L (ref 22–32)
Calcium: 9 mg/dL (ref 8.9–10.3)
Chloride: 105 mmol/L (ref 98–111)
Creatinine, Ser: 0.88 mg/dL (ref 0.44–1.00)
GFR, Estimated: >60 mL/min (ref >60)
Glucose, Bld: 99 mg/dL (ref 70–99)
Potassium: 4.2 mmol/L (ref 3.5–5.1)
Sodium: 139 mmol/L (ref 135–145)
Total Bilirubin: 0.6 mg/dL (ref 0.3–1.2)
Total Protein: 7.2 g/dL (ref 6.5–8.1)

## 2021-04-04 LAB — CBC WITH DIFFERENTIAL/PLATELET
Abs Immature Granulocytes: 0.03 10*3/uL (ref 0.00–0.07)
Basophils Absolute: 0.1 10*3/uL (ref 0.0–0.1)
Basophils Relative: 1 %
Eosinophils Absolute: 0.3 10*3/uL (ref 0.0–0.5)
Eosinophils Relative: 4 %
HCT: 40.7 % (ref 36.0–46.0)
Hemoglobin: 13.3 g/dL (ref 12.0–15.0)
Immature Granulocytes: 0 %
Lymphocytes Relative: 27 %
Lymphs Abs: 2.1 10*3/uL (ref 0.7–4.0)
MCH: 28.3 pg (ref 26.0–34.0)
MCHC: 32.7 g/dL (ref 30.0–36.0)
MCV: 86.6 fL (ref 80.0–100.0)
Monocytes Absolute: 0.6 10*3/uL (ref 0.1–1.0)
Monocytes Relative: 8 %
Neutro Abs: 4.9 10*3/uL (ref 1.7–7.7)
Neutrophils Relative %: 60 %
Platelets: 234 10*3/uL (ref 150–400)
RBC: 4.7 MIL/uL (ref 3.87–5.11)
RDW: 15 % (ref 11.5–15.5)
WBC: 8 10*3/uL (ref 4.0–10.5)
nRBC: 0 % (ref 0.0–0.2)

## 2021-04-04 LAB — LACTATE DEHYDROGENASE: LDH: 128 U/L (ref 98–192)

## 2021-04-07 NOTE — Progress Notes (Signed)
[name], [name] [address]   CLINIC:  Medical Oncology/Hematology  PCP:  Fayrene Helper, MD [address] / [place] [address] [phone]   REASON FOR VISIT:  Follow-up for MALT lymphoma  PRIOR THERAPY: none  CURRENT THERAPY: Surveillance  BRIEF ONCOLOGIC HISTORY:  Oncology History   No history exists.    CANCER STAGING: Cancer Staging No matching staging information was found for the patient.  INTERVAL HISTORY:  [name], a 70 y.o. female, returns for routine follow-up of her MALT lymphoma. Abrey was last seen on 10/02/2020.   Today she reports feeling good, and she denies infections, fever, night sweats, and weight loss. She reports pain in her shoulder bilaterally which she reports is typical for her during cold weather. Her the pain from her arthritis in her wrist and fingers is stable with tylenol.   REVIEW OF SYSTEMS:  Review of Systems  Constitutional:  Negative for appetite change, fatigue (80%), fever and unexpected weight change.  Musculoskeletal:  Positive for arthralgias (shoulders).  Neurological:  Positive for numbness.  All other systems reviewed and are negative.  PAST MEDICAL/SURGICAL HISTORY:  Past Medical History:  Diagnosis Date   Acute left-sided back pain with sciatica 11/27/2018   Annual physical exam 04/12/2015   Arthritis    Phreesia 05/01/2020   Arthritis, rheumatoid ([place]) 1993   hx   Cancer ([place])    Phreesia 05/01/2020   Collagen vascular disease (HCC)    RA   COPD (chronic obstructive pulmonary disease) (HCC)    Hyperlipidemia    Hypertension    Kidney stone 1999   Non Hodgkin's lymphoma ([place])    Obesity    Osteoporosis 2019   intolerant of fosamax due to jaw pain   Porphyria ([place])    hx    Past Surgical History:  Procedure Laterality Date   APPENDECTOMY  1983   BREAST BIOPSY Left    diagnosid with non hogdkins lymphoma   COLONOSCOPY WITH PROPOFOL N/A  05/30/2020   Procedure: COLONOSCOPY WITH PROPOFOL;  Surgeon: Rogene Houston, MD;  [facility]: [facility];  Service: Endoscopy;  Laterality: N/A;  10:30   LITHOTRIPSY  2000   rt renal stone    POLYPECTOMY  05/30/2020   Procedure: POLYPECTOMY;  Surgeon: Rogene Houston, MD;  Location: [facility];  Service: Endoscopy;;    SOCIAL HISTORY:  Social History   Socioeconomic History   Marital status: Single    Spouse name: Not on file   Number of children: 0   Years of education: 12+   Highest education level: Doctorate  Occupational History   Occupation: Retired; Teacher part time currently  Tobacco Use   Smoking status: Former    Packs/day: 0.50    Types: Cigarettes    Quit date: 05/25/2020    Years since quitting: 0.8   Smokeless tobacco: Never   Tobacco comments:    only has one occasionally  Vaping Use   Vaping Use: Never used  Substance and Sexual Activity   Alcohol use: No   Drug use: No   Sexual activity: Not Currently  Other Topics Concern   Not on file  Social History Narrative   Lives alone    Social Determinants of Health   Financial Resource Strain: Low Risk    Difficulty of Paying Living Expenses: Not hard at all  Food Insecurity: No Food Insecurity   Worried About Charity fundraiser  in the Last Year: Never true   [place] in the Last Year: Never true  Transportation Needs: No Transportation Needs   Lack of Transportation (Medical): No   Lack of Transportation (Non-Medical): No  Physical Activity: Insufficiently Active   Days of Exercise per Week: 3 days   Minutes of Exercise per Session: 30 min  Stress: No Stress Concern Present   Feeling of Stress : Not at all  Social Connections: Moderately Isolated   Frequency of Communication with Friends and Family: More than three times a week   Frequency of Social Gatherings with Friends and Family: More than three times a week   Attends Religious Services: More than 4 times per year   Active Member of  [employer] or Organizations: No   Attends Music therapist: Never   Marital Status: Separated  Human resources officer Violence: Not At Risk   Fear of Current or Ex-Partner: No   Emotionally Abused: No   Physically Abused: No   Sexually Abused: No    FAMILY HISTORY:  Family History  Problem Relation Age of Onset   Arthritis Mother    Hypertension Mother    Hypertension Sister    Arthritis Sister    Hypertension Sister    Arthritis Sister    Ulcerative colitis Sister    Hypertension Sister    Arthritis Sister    Stroke Maternal Grandmother    Arthritis Maternal Grandfather    Arthritis Paternal Grandmother    Stroke Paternal Grandfather     CURRENT MEDICATIONS:  Current Outpatient Medications  Medication Sig Dispense Refill   acetaminophen (TYLENOL) 500 MG tablet Take 1,000 mg by mouth every 6 (six) hours as needed for moderate pain.     albuterol (VENTOLIN HFA) 108 (90 Base) MCG/ACT inhaler Inhale 2 puffs into the lungs every 6 (six) hours as needed for wheezing or shortness of breath. 1 each 3   alendronate (FOSAMAX) 70 MG tablet Take 1 tablet (70 mg total) by mouth every 7 (seven) days. Take with a full glass of water on an empty stomach. 12 tablet 3   ALPRAZolam (XANAX) 0.25 MG tablet Take one tablet by mouth once daily, as needed, for anxiety 10 tablet 0   aspirin 81 MG EC tablet Take 1 tablet (81 mg total) by mouth daily. 30 tablet 12   Calcium Carbonate-Vitamin D 600-400 MG-UNIT tablet Take 1 tablet by mouth daily.     Cholecalciferol (VITAMIN D3) 5000 units TABS Take 5,000 Units by mouth daily.     Coenzyme Q10 100 MG capsule Take 100 mg by mouth 3 (three) times daily.     EPINEPHrine 0.3 mg/0.3 mL IJ SOAJ injection Inject 0.3 mg into the muscle as needed for anaphylaxis. 1 each 2   fluticasone (FLONASE) 50 MCG/ACT nasal spray Place 2 sprays into both nostrils daily. (Patient taking differently: Place 2 sprays into both nostrils daily as needed for allergies.) 48 g 1    gabapentin (NEURONTIN) 100 MG capsule Take 1 capsule (100 mg total) by mouth at bedtime. 90 capsule 3   hydroxychloroquine (PLAQUENIL) 200 MG tablet Take 400 mg by mouth daily.      loratadine (CLARITIN) 10 MG tablet Take 10 mg by mouth daily.     losartan (COZAAR) 50 MG tablet Take 1 tablet (50 mg total) by mouth daily. 90 tablet 3   montelukast (SINGULAIR) 10 MG tablet Take 1 tablet (10 mg total) by mouth at bedtime. 90 tablet 3   Multiple Vitamins-Minerals (  CENTRUM SILVER PO) Take 1 tablet by mouth daily.     Omega-3 Fatty Acids (FISH OIL) 1000 MG CAPS Take 1 capsule (1,000 mg total) by mouth daily.  0   omeprazole (PRILOSEC) 20 MG capsule Take 20 mg by mouth at bedtime.     pravastatin (PRAVACHOL) 10 MG tablet TAKE 1 TABLET DAILY STOP   TAKING CRESTOR (Patient taking differently: Take 10 mg by mouth 4 (four) times a week.) 90 tablet 3   Turmeric 500 MG CAPS Take 1 capsule by mouth.     vitamin B-12 (CYANOCOBALAMIN) 50 MCG tablet Take 50 mcg by mouth daily.     Zinc 50 MG TABS Take 50 mg by mouth daily.     No current facility-administered medications for this visit.    ALLERGIES:  Allergies  Allergen Reactions   Septra [Sulfamethoxazole-Trimethoprim] Dermatitis   Barbiturates Other (See Comments)    Prophyria    Chlordiazepoxide Other (See Comments)    unknown   Hydrocodone-Acetaminophen Hives and Other (See Comments)   Pentothal [Thiopental]     Hives?   Phenytoin Other (See Comments)    Prophyria    Rofecoxib Other (See Comments)    Prophyria    Cefuroxime Axetil Rash   Other    Wellbutrin [Bupropion] Palpitations    PHYSICAL EXAM:  Performance status (ECOG): 1 - Symptomatic but completely ambulatory  There were no vitals filed for this visit. Wt Readings from Last 3 Encounters:  03/22/21 208 lb 0.6 oz (94.4 kg)  01/11/21 210 lb (95.3 kg)  10/02/20 207 lb (93.9 kg)   Physical Exam Vitals reviewed.  Constitutional:      Appearance: Normal appearance. She is  obese.  Cardiovascular:     Rate and Rhythm: Normal rate and regular rhythm.     Pulses: Normal pulses.     Heart sounds: Normal heart sounds.  Pulmonary:     Effort: Pulmonary effort is normal.     Breath sounds: Normal breath sounds.  Chest:  Breasts:    Left: Normal. No mass or skin change.  Abdominal:     Palpations: Abdomen is soft. There is no hepatomegaly, splenomegaly or mass.     Tenderness: There is no abdominal tenderness.  Musculoskeletal:     Right lower leg: No edema.     Left lower leg: No edema.  Lymphadenopathy:     Upper Body:     Right upper body: No supraclavicular, axillary or pectoral adenopathy.     Left upper body: No supraclavicular, axillary or pectoral adenopathy.     Lower Body: No right inguinal adenopathy. No left inguinal adenopathy.  Neurological:     General: No focal deficit present.     Mental Status: She is alert and oriented to person, place, and time.  Psychiatric:        Mood and Affect: Mood normal.        Behavior: Behavior normal.     LABORATORY DATA:  I have reviewed the labs as listed.  CBC Latest Ref Rng & Units 04/04/2021 09/25/2020 03/27/2020  WBC 4.0 - 10.5 K/uL 8.0 7.2 8.6  Hemoglobin 12.0 - 15.0 g/dL 13.3 13.1 13.1  Hematocrit 36.0 - 46.0 % 40.7 40.9 41.2  Platelets 150 - 400 K/uL 234 264 240   CMP Latest Ref Rng & Units 04/04/2021 09/25/2020 03/27/2020  Glucose 70 - 99 mg/dL 99 97 100(H)  BUN 8 - 23 mg/dL 11 14 17   Creatinine 0.44 - 1.00 mg/dL 0.88 0.90 0.93  Sodium  135 - 145 mmol/L 139 138 136  Potassium 3.5 - 5.1 mmol/L 4.2 4.3 3.9  Chloride 98 - 111 mmol/L 105 105 102  CO2 22 - 32 mmol/L 26 27 25   Calcium 8.9 - 10.3 mg/dL 9.0 8.7(L) 9.5  Total Protein 6.5 - 8.1 g/dL 7.2 7.1 7.8  Total Bilirubin 0.3 - 1.2 mg/dL 0.6 0.5 0.4  Alkaline Phos 38 - 126 U/L 68 63 57  AST 15 - 41 U/L 20 20 22   ALT 0 - 44 U/L 18 18 19     DIAGNOSTIC IMAGING:  I have independently reviewed the scans and discussed with the patient. NM PET  Image Initial (PI) Skull Base To Thigh  Result Date: 04/04/2021 CLINICAL DATA:  Subsequent treatment strategy for MALT lymphoma. EXAM: NUCLEAR MEDICINE PET SKULL BASE TO THIGH TECHNIQUE: 10.4 mCi F-18 FDG was injected intravenously. Full-ring PET imaging was performed from the skull base to thigh after the radiotracer. CT data was obtained and used for attenuation correction and anatomic localization. Fasting blood glucose: 108 mg/dl COMPARISON:  03/06/2020 FINDINGS: Mediastinal blood pool activity: SUV max 2.4 Liver activity: SUV max 3.5 NECK: No hypermetabolic lymph nodes in the neck. Incidental CT findings: none CHEST: The tiny focus of subtle hypermetabolism seen in the upper left breast previously is stable in the interval with the tiny calcification is again noted on CT imaging. No hypermetabolic lymphadenopathy in the chest. Incidental CT findings: Coronary artery calcification is evident. Mild atherosclerotic calcification is noted in the wall of the thoracic aorta. Centrilobular emphsyema noted. Previously described tiny right upper lobe nodule remains stable on 57/4. 4 mm left upper lobe nodule on 65/4 is new in the interval without discernible hypermetabolism. No pleural effusion. ABDOMEN/PELVIS: No hypermetabolic lymphadenopathy in the abdomen. The hypermetabolic bilateral index external iliac node seen previously are again noted. Right external iliac node is 9 mm short axis today compared to 10 previously with SUV max = 2.7 today (Deauville 3) compared to 4.2 previously. The left index node is 11 mm short axis today (161/4) compared to 10 mm previously. SUV max = 3.7 today compared to 4.4 previously ( Deauville 3). Incidental CT findings: There is mild atherosclerotic calcification of the abdominal aorta without aneurysm. Diverticular changes are seen diffusely in the colon without diverticulitis. SKELETON: No focal hypermetabolic activity to suggest skeletal metastasis. Incidental CT findings: none  IMPRESSION: 1. Stable exam. Left breast lesion again demonstrates Deauville 2 activity. The bilateral external iliac lymph nodes identified on the previous exam are similar in size with slight decrease in hypermetabolism ( Deauville 3) today. 2. Tiny right upper lobe pulmonary nodule remains stable with a new 4 mm left upper lobe nodule on today's exam. Noncontrast CT chest in 3 months recommended to re-evaluate. 3. Diverticular disease without diverticulitis. 4.  Aortic Atherosclerois (ICD10-170.0) 5.  Emphysema. ([id number]) Electronically Signed   By: Misty Stanley M.D.   On: 04/04/2021 12:35     ASSESSMENT:  1.  Clinical stage III MALT lymphoma: -Mammogram on May 12, 2019 showed left breast mass. -Biopsy consistent with non-Hodgkin's lymphoma, specifically extranodal marginal zone lymphoma of MALT with plasmacytic differentiation. -She did not have any B symptoms. PET scan on 06/17/2019 showed mild hypermetabolic area in the left breast at the site of known lymphoma. Intense FDG uptake within the retrobulbar and periorbital soft tissues without signs of visible soft tissue mass or lesion. Mildly hypermetabolic lymph nodes in the external iliac chains bilaterally. Asymmetry of the tonsillar tissue with increased activity on the  left side. MRI of the orbits did not show any mass. -She completed radiation therapy to the left breast mass end of March 2021. -PET CT scan on 03/06/2020 showed improvement at site of prior biopsy in the left upper breast, SUV 1.2, previously 4.4.  Stable mildly prominent bilateral external iliac lymph nodes with no new adenopathy.     2. Rheumatoid arthritis: -She was diagnosed in her 79s. Currently on Plaquenil.   PLAN:  1.  Clinical stage III MALT lymphoma: - She denies any fevers, night sweats or weight loss. - Breast exam did not show any palpable mass in the left breast. - Reviewed labs from 04/04/2021 which showed normal LDH and LFTs.  CBC was normal. -  Reviewed PET scan from 04/03/2021 which showed left breast lesion, Deauville 2.  Bilateral external iliac lymph nodes identified on the previous scan are similar in size and slight decrease in hypermetabolism.  Tiny right upper lobe lung nodule remained stable.  New 4 mm left upper lobe nodule with no increased SUV.  Diverticular disease. - I do not believe she has any evidence of recurrence at this time. - RTC 6 months for follow-up.  I plan to repeat CT of the chest without contrast to follow-up on the left lung nodule.  We will also repeat labs at that time.   2. Rheumatoid arthritis: -Continue Plaquenil.   Orders placed this encounter:  No orders of the defined types were placed in this encounter.    Derek Jack, MD [place] [phone]   I, Thana Ates, am acting as a scribe for Dr. Derek Jack.  I, Derek Jack MD, have reviewed the above documentation for accuracy and completeness, and I agree with the above.

## 2021-04-08 ENCOUNTER — Inpatient Hospital Stay (HOSPITAL_BASED_OUTPATIENT_CLINIC_OR_DEPARTMENT_OTHER): Payer: Medicare Other | Admitting: Hematology

## 2021-04-08 ENCOUNTER — Other Ambulatory Visit: Payer: Self-pay

## 2021-04-08 VITALS — BP 142/88 | HR 96 | Temp 98.8°F | Resp 18 | Wt 208.9 lb

## 2021-04-08 DIAGNOSIS — C8590 Non-Hodgkin lymphoma, unspecified, unspecified site: Secondary | ICD-10-CM

## 2021-04-08 DIAGNOSIS — M069 Rheumatoid arthritis, unspecified: Secondary | ICD-10-CM | POA: Diagnosis not present

## 2021-04-08 DIAGNOSIS — Z8572 Personal history of non-Hodgkin lymphomas: Secondary | ICD-10-CM | POA: Diagnosis not present

## 2021-04-08 NOTE — Patient Instructions (Addendum)
[place] at [facility] Discharge Instructions  You were seen and examined today by Dr. Delton Coombes. Dr. Delton Coombes reviewed your recent PET scan - Good news! There is nothing concerning for cancer.  Dr. Delton Coombes has recommended a follow-up CT scan and labs in 6 months.   Thank you for choosing [place] at [facility] to provide your oncology and hematology care.  To afford each patient quality time with our provider, please arrive at least 15 minutes before your scheduled appointment time.   If you have a lab appointment with the [facility] please come in thru the Main Entrance and check in at the main information desk.  You need to re-schedule your appointment should you arrive 10 or more minutes late.  We strive to give you quality time with our providers, and arriving late affects you and other patients whose appointments are after yours.  Also, if you no show three or more times for appointments you may be dismissed from the clinic at the providers discretion.     Again, thank you for choosing [facility].  Our hope is that these requests will decrease the amount of time that you wait before being seen by our physicians.       _____________________________________________________________  Should you have questions after your visit to [facility], please contact our office at [phone] and follow the prompts.  Our office hours are 8:00 a.m. and 4:30 p.m. Monday - Friday.  Please note that voicemails left after 4:00 p.m. may not be returned until the following business day.  We are closed weekends and major holidays.  You do have access to a nurse 24-7, just call the main number to the clinic [phone] and do not press any options, hold on the line and a nurse will answer the phone.    For prescription refill requests, have your pharmacy contact our office and allow 72 hours.    Due to Covid, you will need  to wear a mask upon entering the hospital. If you do not have a mask, a mask will be given to you at the Main Entrance upon arrival. For doctor visits, patients may have 1 support person age 20 or older with them. For treatment visits, patients can not have anyone with them due to social distancing guidelines and our immunocompromised population.

## 2021-04-11 ENCOUNTER — Ambulatory Visit (HOSPITAL_COMMUNITY): Payer: BC Managed Care – PPO | Admitting: Hematology

## 2021-04-23 ENCOUNTER — Ambulatory Visit (INDEPENDENT_AMBULATORY_CARE_PROVIDER_SITE_OTHER): Payer: Medicare Other | Admitting: *Deleted

## 2021-04-23 DIAGNOSIS — E785 Hyperlipidemia, unspecified: Secondary | ICD-10-CM

## 2021-04-23 DIAGNOSIS — I10 Essential (primary) hypertension: Secondary | ICD-10-CM

## 2021-04-23 NOTE — Chronic Care Management (AMB) (Signed)
Chronic Care Management   CCM RN Visit Note  04/23/2021 Name: [name] MRN: [id number] DOB: July 22, 1950  Subjective: [name] is a 70 y.o. year old female who is a primary care patient of Moshe Cipro Norwood Levo, MD. The care management team was consulted for assistance with disease management and care coordination needs.    Engaged with patient by telephone for follow up visit in response to provider referral for case management and/or care coordination services.   Consent to Services:  The patient was given information about Chronic Care Management services, agreed to services, and gave verbal consent prior to initiation of services.  Please see initial visit note for detailed documentation.   Patient agreed to services and verbal consent obtained.   Assessment: Review of patient past medical history, allergies, medications, health status, including review of consultants reports, laboratory and other test data, was performed as part of comprehensive evaluation and provision of chronic care management services.   SDOH (Social Determinants of Health) assessments and interventions performed:    CCM Care Plan  Allergies  Allergen Reactions   Septra [Sulfamethoxazole-Trimethoprim] Dermatitis   Barbiturates Other (See Comments)    Prophyria    Chlordiazepoxide Other (See Comments)    unknown   Hydrocodone-Acetaminophen Hives and Other (See Comments)   Pentothal [Thiopental]     Hives?   Phenytoin Other (See Comments)    Prophyria    Rofecoxib Other (See Comments)    Prophyria    Cefuroxime Axetil Rash   Other    Wellbutrin [Bupropion] Palpitations    Outpatient Encounter Medications as of 04/23/2021  Medication Sig Note   acetaminophen (TYLENOL) 500 MG tablet Take 1,000 mg by mouth every 6 (six) hours as needed for moderate pain.    alendronate (FOSAMAX) 70 MG tablet Take 1 tablet (70 mg total) by mouth every 7 (seven) days. Take with a full glass of water on an empty  stomach.    ALPRAZolam (XANAX) 0.25 MG tablet Take one tablet by mouth once daily, as needed, for anxiety    aspirin 81 MG EC tablet Take 1 tablet (81 mg total) by mouth daily.    Calcium Carbonate-Vitamin D 600-400 MG-UNIT tablet Take 1 tablet by mouth daily.    Cholecalciferol (VITAMIN D3) 5000 units TABS Take 5,000 Units by mouth daily.    Coenzyme Q10 100 MG capsule Take 100 mg by mouth 3 (three) times daily.    fluticasone (FLONASE) 50 MCG/ACT nasal spray Place 2 sprays into both nostrils daily. (Patient taking differently: Place 2 sprays into both nostrils daily as needed for allergies.)    gabapentin (NEURONTIN) 100 MG capsule Take 1 capsule (100 mg total) by mouth at bedtime.    hydroxychloroquine (PLAQUENIL) 200 MG tablet Take 400 mg by mouth daily.     loratadine (CLARITIN) 10 MG tablet Take 10 mg by mouth daily.    losartan (COZAAR) 50 MG tablet Take 1 tablet (50 mg total) by mouth daily.    montelukast (SINGULAIR) 10 MG tablet Take 1 tablet (10 mg total) by mouth at bedtime.    Multiple Vitamins-Minerals (CENTRUM SILVER PO) Take 1 tablet by mouth daily.    Omega-3 Fatty Acids (FISH OIL) 1000 MG CAPS Take 1 capsule (1,000 mg total) by mouth daily.    omeprazole (PRILOSEC) 20 MG capsule Take 20 mg by mouth at bedtime.    pravastatin (PRAVACHOL) 10 MG tablet TAKE 1 TABLET DAILY STOP   TAKING CRESTOR (Patient taking differently: Take 10 mg by  mouth 4 (four) times a week.) 03/22/2021: Takes 5 days per week   Turmeric 500 MG CAPS Take 1 capsule by mouth.    vitamin B-12 (CYANOCOBALAMIN) 50 MCG tablet Take 50 mcg by mouth daily.    Zinc 50 MG TABS Take 50 mg by mouth daily.    albuterol (VENTOLIN HFA) 108 (90 Base) MCG/ACT inhaler Inhale 2 puffs into the lungs every 6 (six) hours as needed for wheezing or shortness of breath. (Patient not taking: Reported on 04/23/2021)    EPINEPHrine 0.3 mg/0.3 mL IJ SOAJ injection Inject 0.3 mg into the muscle as needed for anaphylaxis. (Patient not  taking: Reported on 04/08/2021)    No facility-administered encounter medications on file as of 04/23/2021.    Patient Active Problem List   Diagnosis Date Noted   Bilateral shoulder pain 07/18/2020   Stiffness of right knee 07/18/2020   Insomnia due to anxiety and fear 07/03/2019   MALT lymphoma ([place]) 06/07/2019   Osteoporosis 04/28/2018   Abnormal CT scan, chest 06/10/2016   Rheumatoid arthritis ([place]) 05/13/2016   Annual physical exam 04/12/2015   GERD (gastroesophageal reflux disease) 03/24/2014   Obesity (BMI 30-39.9) 75/17/0017   Metabolic syndrome X 49/44/9675   Prediabetes 09/28/2011   Allergic rhinitis 09/04/2009   Hyperlipidemia LDL goal <100 08/12/2007   Essential hypertension 08/12/2007    Conditions to be addressed/monitored:HTN and HLD  Care Plan : RN Care Manager plan of care  Updates made by Kassie Mends, RN since 04/23/2021 12:00 AM     Problem: No plan of care established for management of chronic disease states (HTN, HLD, Lymphoma)   Priority: High     Long-Range Goal: Develoment of Plan of Care for chronic disease management (HTN, HLD, Lymphoma)   Start Date: 04/23/2021  Expected End Date: 10/20/2021  Priority: High  Note:   Current Barriers:  Knowledge Deficits related to plan of care for management of HTN and HLD  Patient reports she lives alone and has sisters nearby who can assist her if needed, pt still drives, is independent with all aspects of her care, has cane to use if needed, pt reports she has arthritis and this hinders her ability to exercise, she walks in her house sometimes and also stretches.  Patient checks blood pressure 2-3 times per month, reports readings have been within normal limits, has advanced directives in place. Poorly controlled hyperlipidemia, complicated by diet, lack of consistent exercise (unable due to rheumatoid arthritis) Current antihyperlipidemic regimen: pravastatin Continues following up with oncology- Dr.  Delton Coombes- for lymphoma, to have repeat CT scan in 6 months  RNCM Clinical Goal(s):  Patient will verbalize understanding of plan for management of HTN and HLD as evidenced by patient report, review of EHR and  through collaboration with RN Care manager, provider, and care team.   Interventions: 1:1 collaboration with primary care provider regarding development and update of comprehensive plan of care as evidenced by provider attestation and co-signature Inter-disciplinary care team collaboration (see longitudinal plan of care) Evaluation of current treatment plan related to  self management and patient's adherence to plan as established by provider  Hyperlipidemia Interventions:  (Status:  Goal on track:  Yes.) Long Term Goal Medication review performed; medication list updated in electronic medical record.  Counseled on importance of regular laboratory monitoring as prescribed Reviewed role and benefits of statin for ASCVD risk reduction Reviewed importance of limiting foods high in cholesterol Reinforced heart healthy diet  Hypertension Interventions:  (Status:  Goal on track:  Yes.) Long Term Goal Last practice recorded BP readings:  BP Readings from Last 3 Encounters:  04/08/21 (!) 142/88  03/22/21 134/80  01/11/21 138/84  Most recent eGFR/CrCl: No results found for: EGFR  No components found for: CRCL  Evaluation of current treatment plan related to hypertension self management and patient's adherence to plan as established by provider Provided education to patient re: stroke prevention, s/s of heart attack and stroke Advised patient, providing education and rationale, to monitor blood pressure daily and record, calling PCP for findings outside established parameters Discussed complications of poorly controlled blood pressure such as heart disease, stroke, circulatory complications, vision complications, kidney impairment, sexual dysfunction Reinforced low sodium diet Reviewed  practicing good handwashing and wearing a mask as needed Reviewed upcoming scheduled appointments 12/14 annual wellness visit, 12/23 mammogram and DEXA  Patient Goals/Self-Care Activities: Take all medications as prescribed Attend all scheduled provider appointments Call pharmacy for medication refills 3-7 days in advance of running out of medications Perform all self care activities independently  Perform IADL's (shopping, preparing meals, housekeeping, managing finances) independently Call provider office for new concerns or questions  Check blood pressure at least 3 x per week and record Follow low sodium, heart healthy diet- read labels Take all medications as prescribed  Follow Up Plan:  Telephone follow up appointment with care management team member scheduled for:  07/16/2021      Plan:Telephone follow up appointment with care management team member scheduled for:  07/16/2021  Jacqlyn Larsen [facility], BSN RN Case Manager [facility] [phone]

## 2021-04-23 NOTE — Patient Instructions (Addendum)
Visit Information  Thank you for taking time to visit with me today. Please don't hesitate to contact me if I can be of assistance to you before our next scheduled telephone appointment.  Following are the goals we discussed today:  Take all medications as prescribed Attend all scheduled provider appointments Call pharmacy for medication refills 3-7 days in advance of running out of medications Perform all self care activities independently  Perform IADL's (shopping, preparing meals, housekeeping, managing finances) independently Call provider office for new concerns or questions  Check blood pressure at least 3 x per week and record Follow low sodium, heart healthy diet- read labels Take all medications as prescribed  Our next appointment is by telephone on 07/18/2021 at 330 pm  Please call the care guide team at [phone] if you need to cancel or reschedule your appointment.   If you are experiencing a Mental Health or [place] or need someone to talk to, please go to [facility] [address] [place] [phone]) call the [place]: [phone] call [phone]   Patient verbalizes understanding of instructions provided today and agrees to view in [place].   Jacqlyn Larsen [facility], BSN RN Case Manager [facility] [phone]

## 2021-04-24 DIAGNOSIS — I10 Essential (primary) hypertension: Secondary | ICD-10-CM | POA: Diagnosis not present

## 2021-04-24 DIAGNOSIS — E785 Hyperlipidemia, unspecified: Secondary | ICD-10-CM | POA: Diagnosis not present

## 2021-05-14 DIAGNOSIS — H209 Unspecified iridocyclitis: Secondary | ICD-10-CM | POA: Diagnosis not present

## 2021-05-14 DIAGNOSIS — M2550 Pain in unspecified joint: Secondary | ICD-10-CM | POA: Diagnosis not present

## 2021-05-14 DIAGNOSIS — M0579 Rheumatoid arthritis with rheumatoid factor of multiple sites without organ or systems involvement: Secondary | ICD-10-CM | POA: Diagnosis not present

## 2021-05-14 DIAGNOSIS — Z79899 Other long term (current) drug therapy: Secondary | ICD-10-CM | POA: Diagnosis not present

## 2021-05-14 DIAGNOSIS — M150 Primary generalized (osteo)arthritis: Secondary | ICD-10-CM | POA: Diagnosis not present

## 2021-05-16 ENCOUNTER — Ambulatory Visit (HOSPITAL_COMMUNITY): Payer: Medicare Other

## 2021-05-16 ENCOUNTER — Other Ambulatory Visit (HOSPITAL_COMMUNITY): Payer: Medicare Other

## 2021-05-17 ENCOUNTER — Ambulatory Visit (HOSPITAL_COMMUNITY)
Admission: RE | Admit: 2021-05-17 | Discharge: 2021-05-17 | Disposition: A | Discharge status: Home / Self Care | Payer: Medicare Other | Source: Ambulatory Visit | Attending: Family Medicine | Admitting: Family Medicine

## 2021-05-17 ENCOUNTER — Other Ambulatory Visit: Payer: Self-pay

## 2021-05-17 DIAGNOSIS — Z780 Asymptomatic menopausal state: Secondary | ICD-10-CM | POA: Insufficient documentation

## 2021-05-17 DIAGNOSIS — Z853 Personal history of malignant neoplasm of breast: Secondary | ICD-10-CM | POA: Insufficient documentation

## 2021-05-17 DIAGNOSIS — Z13820 Encounter for screening for osteoporosis: Secondary | ICD-10-CM | POA: Insufficient documentation

## 2021-05-17 DIAGNOSIS — Z1231 Encounter for screening mammogram for malignant neoplasm of breast: Secondary | ICD-10-CM

## 2021-05-17 DIAGNOSIS — M810 Age-related osteoporosis without current pathological fracture: Secondary | ICD-10-CM | POA: Insufficient documentation

## 2021-05-21 ENCOUNTER — Other Ambulatory Visit: Payer: Self-pay | Admitting: *Deleted

## 2021-05-21 ENCOUNTER — Other Ambulatory Visit: Payer: Self-pay

## 2021-05-21 ENCOUNTER — Ambulatory Visit (INDEPENDENT_AMBULATORY_CARE_PROVIDER_SITE_OTHER): Payer: Medicare Other

## 2021-05-21 DIAGNOSIS — M810 Age-related osteoporosis without current pathological fracture: Secondary | ICD-10-CM

## 2021-05-21 DIAGNOSIS — Z0000 Encounter for general adult medical examination without abnormal findings: Secondary | ICD-10-CM

## 2021-05-21 NOTE — Progress Notes (Signed)
Subjective:   [name] is a 70 y.o. female who presents for Medicare Annual (Subsequent) preventive examination. I connected with  [name] on 05/21/21 by a audio enabled telemedicine application and verified that I am speaking with the correct person using two identifiers.  Patient Location: Home  Provider Location: Office/Clinic  I discussed the limitations of evaluation and management by telemedicine. The patient expressed understanding and agreed to proceed.  Review of Systems    Defer to PCP       Objective:    There were no vitals filed for this visit. There is no height or weight on file to calculate BMI.  Advanced Directives 04/08/2021 01/29/2021 10/02/2020 05/30/2020 05/29/2020 05/02/2020 03/27/2020  Does Patient Have a Medical Advance Directive? Yes Yes Yes Yes Yes Yes Yes  Type of Paramedic of [place];Living will [place];Living will [place];Living will [place];Living will [place];Living will Living will;Healthcare Power of Attorney  Does patient want to make changes to medical advance directive? No - Patient declined No - Patient declined No - Patient declined - - - No - Patient declined  Copy of [place] in Chart? No - copy requested No - copy requested No - copy requested No - copy requested No - copy requested No - copy requested No - copy requested  Would patient like information on creating a medical advance directive? - - No - Patient declined - - - -    Current Medications (verified) Outpatient Encounter Medications as of 05/21/2021  Medication Sig   acetaminophen (TYLENOL) 500 MG tablet Take 1,000 mg by mouth every 6 (six) hours as needed for moderate pain.   albuterol (VENTOLIN HFA) 108 (90 Base) MCG/ACT inhaler Inhale 2 puffs into the lungs every 6 (six) hours as needed for wheezing or shortness of  breath. (Patient not taking: Reported on 04/23/2021)   alendronate (FOSAMAX) 70 MG tablet Take 1 tablet (70 mg total) by mouth every 7 (seven) days. Take with a full glass of water on an empty stomach.   ALPRAZolam (XANAX) 0.25 MG tablet Take one tablet by mouth once daily, as needed, for anxiety   aspirin 81 MG EC tablet Take 1 tablet (81 mg total) by mouth daily.   Calcium Carbonate-Vitamin D 600-400 MG-UNIT tablet Take 1 tablet by mouth daily.   Cholecalciferol (VITAMIN D3) 5000 units TABS Take 5,000 Units by mouth daily.   Coenzyme Q10 100 MG capsule Take 100 mg by mouth 3 (three) times daily.   EPINEPHrine 0.3 mg/0.3 mL IJ SOAJ injection Inject 0.3 mg into the muscle as needed for anaphylaxis. (Patient not taking: Reported on 04/08/2021)   fluticasone (FLONASE) 50 MCG/ACT nasal spray Place 2 sprays into both nostrils daily. (Patient taking differently: Place 2 sprays into both nostrils daily as needed for allergies.)   gabapentin (NEURONTIN) 100 MG capsule Take 1 capsule (100 mg total) by mouth at bedtime.   hydroxychloroquine (PLAQUENIL) 200 MG tablet Take 400 mg by mouth daily.    loratadine (CLARITIN) 10 MG tablet Take 10 mg by mouth daily.   losartan (COZAAR) 50 MG tablet Take 1 tablet (50 mg total) by mouth daily.   montelukast (SINGULAIR) 10 MG tablet Take 1 tablet (10 mg total) by mouth at bedtime.   Multiple Vitamins-Minerals (CENTRUM SILVER PO) Take 1 tablet by mouth daily.   Omega-3 Fatty Acids (FISH OIL) 1000 MG CAPS Take  1 capsule (1,000 mg total) by mouth daily.   omeprazole (PRILOSEC) 20 MG capsule Take 20 mg by mouth at bedtime.   pravastatin (PRAVACHOL) 10 MG tablet TAKE 1 TABLET DAILY STOP   TAKING CRESTOR (Patient taking differently: Take 10 mg by mouth 4 (four) times a week.)   Turmeric 500 MG CAPS Take 1 capsule by mouth.   vitamin B-12 (CYANOCOBALAMIN) 50 MCG tablet Take 50 mcg by mouth daily.   Zinc 50 MG TABS Take 50 mg by mouth daily.   No facility-administered  encounter medications on file as of 05/21/2021.    Allergies (verified) Septra [sulfamethoxazole-trimethoprim], Barbiturates, Chlordiazepoxide, Hydrocodone-acetaminophen, Pentothal [thiopental], Phenytoin, Rofecoxib, Cefuroxime axetil, Other, and Wellbutrin [bupropion]   History: Past Medical History:  Diagnosis Date   Acute left-sided back pain with sciatica 11/27/2018   Annual physical exam 04/12/2015   Arthritis    Phreesia 05/01/2020   Arthritis, rheumatoid ([place]) 1993   hx   Cancer ([place])    Phreesia 05/01/2020   Collagen vascular disease (HCC)    RA   COPD (chronic obstructive pulmonary disease) ([place])    Hyperlipidemia    Hypertension    Kidney stone 1999   Non Hodgkin's lymphoma ([place])    Obesity    Osteoporosis 2019   intolerant of fosamax due to jaw pain   Porphyria ([place])    hx    Past Surgical History:  Procedure Laterality Date   APPENDECTOMY  1983   BREAST BIOPSY Left    diagnosid with non hogdkins lymphoma   COLONOSCOPY WITH PROPOFOL N/A 05/30/2020   Procedure: COLONOSCOPY WITH PROPOFOL;  Surgeon: Rogene Houston, MD;  [facility];  Service: Endoscopy;  Laterality: N/A;  10:30   LITHOTRIPSY  2000   rt renal stone    POLYPECTOMY  05/30/2020   Procedure: POLYPECTOMY;  Surgeon: Rogene Houston, MD;  [facility];  Service: Endoscopy;;   Family History  Problem Relation Age of Onset   Arthritis Mother    Hypertension Mother    Hypertension Sister    Arthritis Sister    Hypertension Sister    Arthritis Sister    Ulcerative colitis Sister    Hypertension Sister    Arthritis Sister    Stroke Maternal Grandmother    Arthritis Maternal Grandfather    Arthritis Paternal Grandmother    Stroke Paternal Grandfather    Social History   Socioeconomic History   Marital status: Single    Spouse name: Not on file   Number of children: 0   Years of education: 12+   Highest education level: Doctorate  Occupational History   Occupation:  Retired; Teacher part time currently  Tobacco Use   Smoking status: Former    Packs/day: 0.50    Types: Cigarettes    Quit date: 05/25/2020    Years since quitting: 0.9   Smokeless tobacco: Never   Tobacco comments:    only has one occasionally  Vaping Use   Vaping Use: Never used  Substance and Sexual Activity   Alcohol use: No   Drug use: No   Sexual activity: Not Currently  Other Topics Concern   Not on file  Social History Narrative   Lives alone    Social Determinants of Health   Financial Resource Strain: Not on file  Food Insecurity: No Food Insecurity   Worried About Running Out of Food in the Last Year: Never true   [place] in the Last Year: Never true  Transportation Needs: No Data processing manager (Medical): No   Lack of Transportation (Non-Medical): No  Physical Activity: Not on file  Stress: Not on file  Social Connections: Not on file    Tobacco Counseling Counseling given: Not Answered Tobacco comments: only has one occasionally   Clinical Intake:  Pre-visit preparation completed: No  Pain : No/denies pain     Nutritional Risks: None Diabetes: No  How often do you need to have someone help you when you read instructions, pamphlets, or other written materials from your doctor or pharmacy?: (P) 1 - Never  Diabetic?no  Interpreter Needed?: No  Information entered by :: [place] of Daily Living In your present state of health, do you have any difficulty performing the following activities: 05/20/2021 05/29/2020  Hearing? N N  Vision? N N  Difficulty concentrating or making decisions? N N  Walking or climbing stairs? N N  Dressing or bathing? N N  Doing errands, shopping? N -  Preparing Food and eating ? N -  Using the Toilet? N -  In the past six months, have you accidently leaked urine? N -  Do you have problems with loss of bowel control? N -  Managing your Medications? N -  Managing your  Finances? N -  Housekeeping or managing your Housekeeping? N -  Some recent data might be hidden    Patient Care Team: Fayrene Helper, MD as PCP - General Gavin Pound, MD as Consulting Physician (Rheumatology) Donetta Potts, RN as Oncology Nurse Navigator (Oncology) Derek Jack, MD as Medical Oncologist (Oncology) Kassie Mends, RN as [place] any recent [place] you may have received from other than [facility] providers in the past year (date may be approximate).     Assessment:   This is a routine wellness examination for [facility].  Hearing/Vision screen No results found.  Dietary issues and exercise activities discussed:     Goals Addressed   None   Depression Screen PHQ 2/9 Scores 03/22/2021 01/29/2021 01/29/2021 01/29/2021 07/18/2020 05/02/2020 05/02/2020  PHQ - 2 Score 0 0 0 0 0 0 0  PHQ- 9 Score 0 - - - - - -    Fall Risk Fall Risk  05/20/2021 03/22/2021 01/29/2021 01/11/2021 07/18/2020  Falls in the past year? 0 0 0 0 0  Number falls in past yr: 0 - - 0 0  Injury with Fall? 0 - - 0 0  Risk for fall due to : - - - - -  Follow up - - - - -    FALL [place]:  Any stairs in or around the home? Yes  If so, are there any without handrails? Yes  Home free of loose throw rugs in walkways, pet beds, electrical cords, etc? Yes  Adequate lighting in your home to reduce risk of falls? Yes   ASSISTIVE DEVICES UTILIZED TO PREVENT FALLS:  Life alert? No  Use of a cane, walker or w/c? No  Grab bars in the bathroom? No  Shower chair or bench in shower? No  Elevated toilet seat or a handicapped toilet? Yes     Cognitive Function:     6CIT Screen 05/02/2020 05/02/2019 04/28/2018  What Year? 0 points 0 points 0 points  What month? 0 points 0 points 0 points  What time? 0 points 0 points 0 points  Count back from 20 0 points 0 points 0  points  Months in reverse 0 points 0 points 0 points   Repeat phrase 0 points 0 points 0 points  Total Score 0 0 0    Immunizations Immunization History  Administered Date(s) Administered   Fluad Quad(high Dose 65+) 03/07/2019, 02/14/2021   Influenza Split 02/04/2020   Influenza Whole 06/16/2006, 05/17/2009   Influenza,inj,Quad PF,6+ Mos 02/12/2018, 02/04/2020   MODERNA COVID-19 SARS-COV-2 PEDS BIVALENT BOOSTER 6Y-11Y 02/28/2021   Moderna Sars-Covid-2 Vaccination 06/30/2019, 07/29/2019, 03/10/2020, 09/03/2020   Pneumococcal Conjugate-13 12/21/2017   Pneumococcal Polysaccharide-23 06/07/2019   Td 06/16/2006   Tdap 03/24/2014   Zoster, Live 09/28/2014    TDAP status: Up to date  Flu Vaccine status: Up to date  Pneumococcal vaccine status: Up to date  Covid-19 vaccine status: Completed vaccines  Qualifies for Shingles Vaccine? Yes   Zostavax completed No   Shingrix Completed?: No.    Education has been provided regarding the importance of this vaccine. Patient has been advised to call insurance company to determine out of pocket expense if they have not yet received this vaccine. Advised may also receive vaccine at local pharmacy or Health Dept. Verbalized acceptance and understanding.  Screening Tests Health Maintenance  Topic Date Due   Zoster Vaccines- Shingrix (1 of 2) Never done   MAMMOGRAM  05/18/2023   TETANUS/TDAP  03/24/2024   COLONOSCOPY (Pts 45-70yrs Insurance coverage will need to be confirmed)  05/30/2030   Pneumonia Vaccine 36+ Years old  Completed   INFLUENZA VACCINE  Completed   DEXA SCAN  Completed   COVID-19 Vaccine  Completed   Hepatitis C Screening  Completed   HPV VACCINES  Aged Out    Health Maintenance  Health Maintenance Due  Topic Date Due   Zoster Vaccines- Shingrix (1 of 2) Never done    Colorectal cancer screening: Type of screening: Colonoscopy. Completed 05/30/2020. Repeat every 10 years  Mammogram status: Completed 05/17/2021. Repeat every year  Bone Density status: Completed  05/17/2021. Results reflect: Bone density results: OSTEOPOROSIS. Repeat every 3 years.  Lung Cancer Screening: (Low Dose CT Chest recommended if Age 55-80 years, 30 pack-year currently smoking OR have quit w/in 15years.) does not qualify.   Lung Cancer Screening Referral: n/a  Additional Screening:  Hepatitis C Screening: does qualify; Completed 07/08/2019  Vision Screening: Recommended annual ophthalmology exams for early detection of glaucoma and other disorders of the eye. Is the patient up to date with their annual eye exam?  Yes  Who is the provider or what is the name of the office in which the patient attends annual eye exams? Dr Katy Fitch If pt is not established with a provider, would they like to be referred to a provider to establish care?  N/A .   Dental Screening: Recommended annual dental exams for proper oral hygiene  Community Resource Referral / Chronic Care Management: CRR required this visit?  No   CCM required this visit?  No      Plan:     I have personally reviewed and noted the following in the patients chart:   Medical and social history Use of alcohol, tobacco or illicit drugs  Current medications and supplements including opioid prescriptions.  Functional ability and status Nutritional status Physical activity Advanced directives List of other physicians Hospitalizations, surgeries, and ER visits in previous 12 months Vitals Screenings to include cognitive, depression, and falls Referrals and appointments  In addition, I have reviewed and discussed with patient certain preventive protocols, quality metrics, and best practice recommendations. A written  personalized care plan for preventive services as well as general preventive health recommendations were provided to patient.     Earline [place], [place]   05/21/2021   Nurse Notes:  Ms. Stegeman , Thank you for taking time to come for your Medicare Wellness Visit. I appreciate your ongoing  commitment to your health goals. Please review the following plan we discussed and let me know if I can assist you in the future.   These are the goals we discussed:  Goals   None     This is a list of the screening recommended for you and due dates:  Health Maintenance  Topic Date Due   Zoster (Shingles) Vaccine (1 of 2) Never done   Mammogram  05/18/2023   Tetanus Vaccine  03/24/2024   Colon Cancer Screening  05/30/2030   Pneumonia Vaccine  Completed   Flu Shot  Completed   DEXA scan (bone density measurement)  Completed   COVID-19 Vaccine  Completed   Hepatitis C Screening: USPSTF Recommendation to screen - Ages 74-79 yo.  Completed   HPV Vaccine  Aged Out

## 2021-05-21 NOTE — Patient Instructions (Addendum)
[name] , Thank you for taking time to come for your Medicare Wellness Visit. I appreciate your ongoing commitment to your health goals. Please review the following plan we discussed and let me know if I can assist you in the future.   These are the goals we discussed:  Goals   None     This is a list of the screening recommended for you and due dates:  Health Maintenance  Topic Date Due   Zoster (Shingles) Vaccine (1 of 2) Never done   Mammogram  05/18/2023   Tetanus Vaccine  03/24/2024   Colon Cancer Screening  05/30/2030   Pneumonia Vaccine  Completed   Flu Shot  Completed   DEXA scan (bone density measurement)  Completed   COVID-19 Vaccine  Completed   Hepatitis C Screening: USPSTF Recommendation to screen - Ages 52-79 yo.  Completed   HPV Vaccine  Aged Out     Health Maintenance, Female Adopting a healthy lifestyle and getting preventive care are important in promoting health and wellness. Ask your health care provider about: The right schedule for you to have regular tests and exams. Things you can do on your own to prevent diseases and keep yourself healthy. What should I know about diet, weight, and exercise? Eat a healthy diet  Eat a diet that includes plenty of vegetables, fruits, low-fat dairy products, and lean protein. Do not eat a lot of foods that are high in solid fats, added sugars, or sodium. Maintain a healthy weight Body mass index (BMI) is used to identify weight problems. It estimates body fat based on height and weight. Your health care provider can help determine your BMI and help you achieve or maintain a healthy weight. Get regular exercise Get regular exercise. This is one of the most important things you can do for your health. Most adults should: Exercise for at least 150 minutes each week. The exercise should increase your heart rate and make you sweat (moderate-intensity exercise). Do strengthening exercises at least twice a week. This is in  addition to the moderate-intensity exercise. Spend less time sitting. Even light physical activity can be beneficial. Watch cholesterol and blood lipids Have your blood tested for lipids and cholesterol at 68 years of age, then have this test every 5 years. Have your cholesterol levels checked more often if: Your lipid or cholesterol levels are high. You are older than 68 years of age. You are at high risk for heart disease. What should I know about cancer screening? Depending on your health history and family history, you may need to have cancer screening at various ages. This may include screening for: Breast cancer. Cervical cancer. Colorectal cancer. Skin cancer. Lung cancer. What should I know about heart disease, diabetes, and high blood pressure? Blood pressure and heart disease High blood pressure causes heart disease and increases the risk of stroke. This is more likely to develop in people who have high blood pressure readings or are overweight. Have your blood pressure checked: Every 3-5 years if you are 69-31 years of age. Every year if you are 12 years old or older. Diabetes Have regular diabetes screenings. This checks your fasting blood sugar level. Have the screening done: Once every three years after age 21 if you are at a normal weight and have a low risk for diabetes. More often and at a younger age if you are overweight or have a high risk for diabetes. What should I know about preventing infection? Hepatitis B  If you have a higher risk for hepatitis B, you should be screened for this virus. Talk with your health care provider to find out if you are at risk for hepatitis B infection. Hepatitis C Testing is recommended for: Everyone born from [birth year] through 1963. Anyone with known risk factors for hepatitis C. Sexually transmitted infections (STIs) Get screened for STIs, including gonorrhea and chlamydia, if: You are sexually active and are younger than 68 years of  age. You are older than 68 years of age and your health care provider tells you that you are at risk for this type of infection. Your sexual activity has changed since you were last screened, and you are at increased risk for chlamydia or gonorrhea. Ask your health care provider if you are at risk. Ask your health care provider about whether you are at high risk for HIV. Your health care provider may recommend a prescription medicine to help prevent HIV infection. If you choose to take medicine to prevent HIV, you should first get tested for HIV. You should then be tested every 3 months for as long as you are taking the medicine. Pregnancy If you are about to stop having your period (premenopausal) and you may become pregnant, seek counseling before you get pregnant. Take 400 to 800 micrograms (mcg) of folic acid every day if you become pregnant. Ask for birth control (contraception) if you want to prevent pregnancy. Osteoporosis and menopause Osteoporosis is a disease in which the bones lose minerals and strength with aging. This can result in bone fractures. If you are 57 years old or older, or if you are at risk for osteoporosis and fractures, ask your health care provider if you should: Be screened for bone loss. Take a calcium or vitamin D supplement to lower your risk of fractures. Be given hormone replacement therapy (HRT) to treat symptoms of menopause. Follow these instructions at home: Alcohol use Do not drink alcohol if: Your health care provider tells you not to drink. You are pregnant, may be pregnant, or are planning to become pregnant. If you drink alcohol: Limit how much you have to: 0-1 drink a day. Know how much alcohol is in your drink. In the [place] one drink equals one 12 oz bottle of beer (355 mL), one 5 oz glass of wine (148 mL), or one 1 oz glass of hard liquor (44 mL). Lifestyle Do not use any products that contain nicotine or tobacco. These products include  cigarettes, chewing tobacco, and vaping devices, such as e-cigarettes. If you need help quitting, ask your health care provider. Do not use street drugs. Do not share needles. Ask your health care provider for help if you need support or information about quitting drugs. General instructions Schedule regular health, dental, and eye exams. Stay current with your vaccines. Tell your health care provider if: You often feel depressed. You have ever been abused or do not feel safe at home. Summary Adopting a healthy lifestyle and getting preventive care are important in promoting health and wellness. Follow your health care provider's instructions about healthy diet, exercising, and getting tested or screened for diseases. Follow your health care provider's instructions on monitoring your cholesterol and blood pressure. This information is not intended to replace advice given to you by your health care provider. Make sure you discuss any questions you have with your health care provider. Document Revised: 10/01/2020 Document Reviewed: 10/01/2020 Elsevier Patient Education  [employer].

## 2021-06-18 ENCOUNTER — Encounter: Payer: Self-pay | Admitting: "Endocrinology

## 2021-06-18 ENCOUNTER — Ambulatory Visit (INDEPENDENT_AMBULATORY_CARE_PROVIDER_SITE_OTHER): Payer: Medicare Other | Admitting: "Endocrinology

## 2021-06-18 ENCOUNTER — Other Ambulatory Visit: Payer: Self-pay

## 2021-06-18 VITALS — BP 136/86 | HR 84 | Ht 67.0 in | Wt 204.2 lb

## 2021-06-18 DIAGNOSIS — M818 Other osteoporosis without current pathological fracture: Secondary | ICD-10-CM

## 2021-06-18 NOTE — Progress Notes (Signed)
06/18/2021      Endocrinology Consult Note  Past Medical History:  Diagnosis Date   Acute left-sided back pain with sciatica 11/27/2018   Annual physical exam 04/12/2015   Arthritis    Phreesia 05/01/2020   Arthritis, rheumatoid ([place]) 1993   hx   Cancer ([place])    Phreesia 05/01/2020   Collagen vascular disease (HCC)    RA   COPD (chronic obstructive pulmonary disease) (HCC)    Hyperlipidemia    Hypertension    Kidney stone 1999   Non Hodgkin's lymphoma ([place])    Obesity    Osteoporosis 2019   intolerant of fosamax due to jaw pain   Porphyria ([place])    hx    Past Surgical History:  Procedure Laterality Date   APPENDECTOMY  1983   BREAST BIOPSY Left    diagnosid with non hogdkins lymphoma   COLONOSCOPY WITH PROPOFOL N/A 05/30/2020   Procedure: COLONOSCOPY WITH PROPOFOL;  Surgeon: Rogene Houston, MD;  [facility];  Service: Endoscopy;  Laterality: N/A;  10:30   LITHOTRIPSY  2000   rt renal stone    POLYPECTOMY  05/30/2020   Procedure: POLYPECTOMY;  Surgeon: Rogene Houston, MD;  Location: [facility];  Service: Endoscopy;;   Social History   Socioeconomic History   Marital status: Single    Spouse name: Not on file   Number of children: 0   Years of education: 12+   Highest education level: Doctorate  Occupational History   Occupation: Retired; Teacher part time currently  Tobacco Use   Smoking status: Some Days    Packs/day: 0.50    Types: Cigarettes    Last attempt to quit: 05/25/2020    Years since quitting: 1.0   Smokeless tobacco: Never   Tobacco comments:    only has one occasionally  Vaping Use   Vaping Use: Never used  Substance and Sexual Activity   Alcohol use: No   Drug use: No   Sexual activity: Not Currently  Other Topics Concern   Not on file  Social History Narrative   Lives alone    Social Determinants of Health   Financial Resource Strain: Low Risk    Difficulty  of Paying Living Expenses: Not hard at all  Food Insecurity: No Food Insecurity   Worried About Charity fundraiser in the Last Year: Never true   Ran Out of Food in the Last Year: Never true  Transportation Needs: No Transportation Needs   Lack of Transportation (Medical): No   Lack of Transportation (Non-Medical): No  Physical Activity: Insufficiently Active   Days of Exercise per Week: 1 day   Minutes of Exercise per Session: 10 min  Stress: No Stress Concern Present   Feeling of Stress : Only a little  Social Connections: Unknown   Frequency of Communication with Friends and Family: More than three times a week   Frequency of Social Gatherings with Friends and Family: More than three times a week   Attends Religious Services: Not on file   Active Member of Clubs or Organizations: Yes   Attends Archivist Meetings: More than 4 times  per year   Marital Status: Never married   Outpatient Encounter Medications as of 06/18/2021  Medication Sig   acetaminophen (TYLENOL) 500 MG tablet Take 1,000 mg by mouth every 6 (six) hours as needed for moderate pain.   albuterol (VENTOLIN HFA) 108 (90 Base) MCG/ACT inhaler Inhale 2 puffs into the lungs every 6 (six) hours as needed for wheezing or shortness of breath.   alendronate (FOSAMAX) 70 MG tablet Take 1 tablet (70 mg total) by mouth every 7 (seven) days. Take with a full glass of water on an empty stomach.   ALPRAZolam (XANAX) 0.25 MG tablet Take one tablet by mouth once daily, as needed, for anxiety   aspirin 81 MG EC tablet Take 1 tablet (81 mg total) by mouth daily.   Calcium Carbonate-Vitamin D 600-400 MG-UNIT tablet Take 1 tablet by mouth daily.   Cholecalciferol (VITAMIN D3) 5000 units TABS Take 5,000 Units by mouth daily.   Coenzyme Q10 100 MG capsule Take 100 mg by mouth 3 (three) times daily.   EPINEPHrine 0.3 mg/0.3 mL IJ SOAJ injection Inject 0.3 mg into the muscle as needed for anaphylaxis.   fluticasone (FLONASE) 50  MCG/ACT nasal spray Place 2 sprays into both nostrils daily. (Patient taking differently: Place 2 sprays into both nostrils daily as needed for allergies.)   gabapentin (NEURONTIN) 100 MG capsule Take 1 capsule (100 mg total) by mouth at bedtime.   hydroxychloroquine (PLAQUENIL) 200 MG tablet Take 400 mg by mouth daily.    loratadine (CLARITIN) 10 MG tablet Take 10 mg by mouth daily.   losartan (COZAAR) 50 MG tablet Take 1 tablet (50 mg total) by mouth daily.   montelukast (SINGULAIR) 10 MG tablet Take 1 tablet (10 mg total) by mouth at bedtime.   Multiple Vitamins-Minerals (CENTRUM SILVER PO) Take 1 tablet by mouth daily.   Omega-3 Fatty Acids (FISH OIL) 1000 MG CAPS Take 1 capsule (1,000 mg total) by mouth daily.   omeprazole (PRILOSEC) 20 MG capsule Take 20 mg by mouth at bedtime.   pravastatin (PRAVACHOL) 10 MG tablet TAKE 1 TABLET DAILY STOP   TAKING CRESTOR (Patient taking differently: Take 10 mg by mouth 4 (four) times a week.)   Turmeric 500 MG CAPS Take 1 capsule by mouth.   vitamin B-12 (CYANOCOBALAMIN) 50 MCG tablet Take 50 mcg by mouth daily.   Zinc 50 MG TABS Take 50 mg by mouth daily.   No facility-administered encounter medications on file as of 06/18/2021.   ALLERGIES: Allergies  Allergen Reactions   Septra [Sulfamethoxazole-Trimethoprim] Dermatitis   Barbiturates Other (See Comments)    Prophyria    Chlordiazepoxide Other (See Comments)    unknown   Hydrocodone-Acetaminophen Hives and Other (See Comments)   Pentothal [Thiopental]     Hives?   Phenytoin Other (See Comments)    Prophyria    Rofecoxib Other (See Comments)    Prophyria    Cefuroxime Axetil Rash   Other    Wellbutrin [Bupropion] Palpitations    VACCINATION STATUS: Immunization History  Administered Date(s) Administered   Fluad Quad(high Dose 65+) 03/07/2019, 02/14/2021   Influenza Split 02/04/2020   Influenza Whole 06/16/2006, 05/17/2009   Influenza,inj,Quad PF,6+ Mos 02/12/2018, 02/04/2020    MODERNA COVID-19 SARS-COV-2 PEDS BIVALENT BOOSTER 6Y-11Y 02/28/2021   Moderna Sars-Covid-2 Vaccination 06/30/2019, 07/29/2019, 03/10/2020, 09/03/2020   Pneumococcal Conjugate-13 12/21/2017   Pneumococcal Polysaccharide-23 06/07/2019   Td 06/16/2006   Tdap 03/24/2014   Zoster, Live 09/28/2014     HPI   Susan Gauss  Paul is 71 y.o. female who presents today with a medical history as above. she is being seen in consultation for  Osteoporosis requested by Fayrene Helper, MD.  Patient was diagnosed with osteoporosis  approximately  2 years ago. She denies fractures or falls. No dizziness/vertigo/orthostasis.  I reviewed pt's DEXA scans: from 2019 and 2022 she has a 28-30% bone loss , worse at spine.   She has been on the following OP treatments: Fosamax 70 mg po weekly intermittently. She took 12/24 months roughly.  Nshe is on regular calcium and vitamin d supplement. She also eats dairy and green, leafy, vegetables.   No weight bearing exercises.   She does not take high vitamin A doses No h/o hyper/hypocalcemia. No h/o hyperparathyroidism. No h/o kidney stones. Lab Results  Component Value Date   CALCIUM 9.0 04/04/2021   CALCIUM 8.7 (L) 09/25/2020   CALCIUM 9.5 03/27/2020   CALCIUM 9.3 10/05/2019   CALCIUM 9.4 06/07/2019   CALCIUM 9.1 11/17/2018   CALCIUM 9.1 05/10/2018   CALCIUM 8.8 05/12/2017   CALCIUM 9.4 06/08/2016   CALCIUM 9.0 05/24/2016    Lab Results  Component Value Date   TSH 1.020 03/20/2021   TSH 0.90 05/10/2018   TSH 0.71 05/24/2016   TSH 0.803 04/11/2015   TSH 0.770 12/24/2011    No h/o CKD. Last BUN/Cr: Lab Results  Component Value Date   BUN 11 04/04/2021   CREATININE 0.88 04/04/2021   Pt does have a FH of osteoporosis. In her mother. She denies height loss. She is a survivor of  NHL.    Review of Systems  Constitutional: no weight gain/loss, no fatigue, no subjective hyperthermia, no subjective hypothermia Eyes: no blurry vision, no  xerophthalmia ENT: no sore throat, no nodules palpated in throat, no dysphagia/odynophagia, no hoarseness Cardiovascular: no Chest Pain, no Shortness of Breath, no palpitations, no leg swelling Respiratory: no cough, no SOB Gastrointestinal: no Nausea/Vomiting/Diarhhea Musculoskeletal: no muscle/joint aches Skin: no rashes Neurological: no tremors, no numbness, no tingling, no dizziness Psychiatric: no depression, no anxiety  Objective:    BP 136/86    Pulse 84    Ht 5\' 7"  (1.702 m)    Wt 204 lb 3.2 oz (92.6 kg)    BMI 31.98 kg/m   Wt Readings from Last 3 Encounters:  06/18/21 204 lb 3.2 oz (92.6 kg)  04/08/21 208 lb 14.4 oz (94.8 kg)  03/22/21 208 lb 0.6 oz (94.4 kg)    Physical Exam  Constitutional:  BMI of 31,  not in acute distress, normal state of mind Eyes: PERRLA, EOMI, no exophthalmos ENT: moist mucous membranes, no thyromegaly, no cervical lymphadenopathy Cardiovascular: normal precordial activity, Regular Rate and Rhythm, no Murmur/Rubs/Gallops Respiratory:  adequate breathing efforts, no gross chest deformity, Clear to auscultation bilaterally Gastrointestinal: abdomen soft, Non -tender, No distension, Bowel Sounds present Musculoskeletal: no gross deformities, strength intact in all four extremities Skin: moist, warm, no rashes Neurological: no tremor with outstretched hands, Deep tendon reflexes normal in all four extremities.  CMP ( most recent) CMP     Component Value Date/Time   NA 139 04/04/2021 0841   K 4.2 04/04/2021 0841   CL 105 04/04/2021 0841   CO2 26 04/04/2021 0841   GLUCOSE 99 04/04/2021 0841   BUN 11 04/04/2021 0841   CREATININE 0.88 04/04/2021 0841   CREATININE 0.85 11/17/2018 0743   CALCIUM 9.0 04/04/2021 0841   PROT 7.2 04/04/2021 0841   ALBUMIN 3.7 04/04/2021 0841   AST 20 04/04/2021  0841   ALT 18 04/04/2021 0841   ALKPHOS 68 04/04/2021 0841   BILITOT 0.6 04/04/2021 0841   GFRNONAA >60 04/04/2021 0841   GFRNONAA 72 11/17/2018 0743    GFRAA >60 10/05/2019 1427   GFRAA 83 11/17/2018 0743     Diabetic Labs (most recent): Lab Results  Component Value Date   HGBA1C 5.4 11/17/2018   HGBA1C 5.7 (H) 05/12/2017   HGBA1C 5.5 05/24/2016     Lipid Panel ( most recent) Lipid Panel     Component Value Date/Time   CHOL 186 03/20/2021 0835   TRIG 99 03/20/2021 0835   HDL 54 03/20/2021 0835   CHOLHDL 3.4 03/20/2021 0835   CHOLHDL 3.1 03/27/2020 1414   VLDL 22 03/27/2020 1414   LDLCALC 114 (H) 03/20/2021 0835   LDLCALC 122 (H) 11/17/2018 0743   LABVLDL 18 03/20/2021 0835      Lab Results  Component Value Date   TSH 1.020 03/20/2021   TSH 0.90 05/10/2018   TSH 0.71 05/24/2016   TSH 0.803 04/11/2015   TSH 0.770 12/24/2011   TSH 1.766 12/19/2010   TSH 0.933 10/06/2007     DXA  : T- Score -3.0 spine, 04/2021, -2.9 in 03/2018 DXA : Femur -3.0 2022, -3.1 2019       Assessment: 1. Osteoporosis  Plan: 1. Osteoporosis - likely multifactorial vs postmenopausal  - Discussed about increased risk of fracture, depending on the T score, greatly increased when the T score is lower than -2.5.  We reviewed her DEXA scans together, and I explained that based on the T scores, she has an increased risk for fractures.  - We discussed about the different medication classes, benefits and side effects (including atypical fractures and ONJ - no dental workup in progress or planned).  - she does not like regular medications and would like to have options for less meds.  She will benefit from IV bisphosphonate, zoledronic acid (iv Reclast) from the point of view of benefit from spine BMD.    - we reviewed her dietary and supplemental calcium and vitamin D intake, which I believe are adequate.   - discussed fall precautions    She has normal renal function. She is in the process of getting dental work, shence, her treatment will be after her healing . She will return in 6 months to assess her renal function and calcim.  - I  advised patient to maintain close follow up with Fayrene Helper, MD for primary care needs.  - Time spent with the patient: 45 minutes, of which >50% was spent in obtaining information about her symptoms, reviewing her previous labs, evaluations, and treatments, counseling her about her  osteoporosis.  [name] participated in the discussions, expressed understanding, and voiced agreement with the above plans.  All questions were answered to her satisfaction. she is encouraged to contact clinic should she have any questions or concerns prior to her return visit.  Follow up plan: Return in about 6 months (around 12/16/2021) for Refer her for Reclast Infusion.   Glade Lloyd, MD [facility] [address] [place], [place] [address] Phone: [phone]  Fax: [phone]     06/18/2021, 4:51 PM  This note was partially dictated with voice recognition software. Similar sounding words can be transcribed inadequately or may not  be corrected upon review.

## 2021-07-16 ENCOUNTER — Ambulatory Visit (INDEPENDENT_AMBULATORY_CARE_PROVIDER_SITE_OTHER): Payer: Medicare Other | Admitting: *Deleted

## 2021-07-16 DIAGNOSIS — E785 Hyperlipidemia, unspecified: Secondary | ICD-10-CM

## 2021-07-16 DIAGNOSIS — I10 Essential (primary) hypertension: Secondary | ICD-10-CM

## 2021-07-16 NOTE — Patient Instructions (Signed)
Visit Information  Thank you for taking time to visit with me today. Please don't hesitate to contact me if I can be of assistance to you before our next scheduled telephone appointment.  Following are the goals we discussed today:  Take all medications as prescribed Attend all scheduled provider appointments Call pharmacy for medication refills 3-7 days in advance of running out of medications Perform all self care activities independently  Perform IADL's (shopping, preparing meals, housekeeping, managing finances) independently Call provider office for new concerns or questions  check blood pressure weekly choose a place to take my blood pressure (home, clinic or office, retail store) write blood pressure results in a log or diary keep a blood pressure log take blood pressure log to all doctor appointments take medications for blood pressure exactly as prescribed take all medications exactly as prescribed call doctor with any symptoms you believe are related to your medicine call doctor when you experience any new symptoms adhere to prescribed diet: heart healthy Follow low sodium, heart healthy diet- read labels Take all medications as prescribed Message sent to Dr. Dorris Fetch about referral for IV Reclast, please follow up with his office if you have not heard anything  Our next appointment is by telephone on 07/25/21 at 130 pm (for follow up on IV referral)  Please call the care guide team at [phone] if you need to cancel or reschedule your appointment.   If you are experiencing a Mental Health or [place] or need someone to talk to, please call the [place] National Suicide Prevention Lifeline: 438-879-2195 or TTY: (747)047-4048 TTY 281-427-9078) to talk to a trained counselor call 1-800-273-TALK (toll free, 24 hour hotline) go to [facility] [address], [place] [phone]) call the [place]:  [phone] call 911   Patient verbalizes understanding of instructions and care plan provided today and agrees to view in [facility]. Active MyChart status confirmed with patient.    Jacqlyn Larsen [facility], BSN RN Case Manager [facility] [phone] Eating Plan for Osteoporosis Osteoporosis causes your bones to become weak and brittle. This puts you at greater risk for bone breaks (fractures) from small bumps or falls. Making changes to your diet and increasing your physical activity can help strengthen your bones and improve your overall health. Calcium and vitamin D are nutrients that play an important role in bone health. Vitamin D helps your body use calcium and strengthen bones. It is important to get enough calcium and vitamin D as part of your eating plan for osteoporosis. What are tips for following this plan? Reading food labels Try to get at least 1,000 milligrams (mg) of calcium each day. Look for foods that have at least 50 mg of calcium per serving. Talk with your health care provider about taking a calcium supplement if you do not get enough calcium from food. Do not have more than 2,500 mg of calcium each day. This is the upper limit for food and nutritional supplements combined. Too much calcium may cause constipation and prevent you from absorbing other important nutrients. Choose foods that contain vitamin D. Take a daily vitamin supplement that contains 800-1,000 international units (IU) of vitamin D. The amount may be different depending on your age, body weight, and where you live. Talk with your dietitian or health care provider about how much vitamin D is right for you. Avoid foods that have more than 300 mg of sodium per serving. Too much sodium can cause your  body to lose calcium. Talk with your dietitian or health care provider about how much sodium you are allowed each day. Shopping Do not buy foods with added salt, including: Salted snacks. Angie Fava. Canned  soups. Canned meats. Processed meats, such as bacon or precooked or cured meat like sausages or meat loaves. Smoked fish. Meal planning Eat balanced meals that contain protein foods, fruits and vegetables, and foods rich in calcium and vitamin D. Eat at least 5 servings of fruits and vegetables each day. Eat 5-6 oz (142-170 g) of lean meat, poultry, fish, eggs, or beans each day. Lifestyle Do not use any products that contain nicotine or tobacco, such as cigarettes, e-cigarettes, and chewing tobacco. If you need help quitting, ask your health care provider. If your health care provider recommends that you lose weight: Work with a dietitian to develop an eating plan that will help you reach your desired weight goal. Exercise for at least 30 minutes a day, 5 or more days a week, or as told by your health care provider. Work with a physical therapist to develop an exercise plan that includes flexibility, balance, and strength exercises. Do not focus only on aerobic exercise. Do not drink alcohol if: Your health care provider tells you not to drink. You are pregnant, may be pregnant, or are planning to become pregnant. If you drink alcohol: Limit how much you use to: 0-1 drink a day for women. 0-2 drinks a day for men. Be aware of how much alcohol is in your drink. In the [place] one drink equals one 12 oz bottle of beer (355 mL), one 5 oz glass of wine (148 mL), or one 1 oz glass of hard liquor (44 mL). What foods should I eat? Foods high in calcium  Yogurt. Yogurt with fruit. Milk. Evaporated skim milk. Dry milk powder. Calcium-fortified orange juice. Parmesan cheese. Part-skim ricotta cheese. Natural hard cheese. Cream cheese. Cottage cheese. Canned sardines. Canned salmon. Calcium-treated tofu. Calcium-fortified cereal bar. Calcium-fortified cereal. Calcium-fortified graham crackers. Cooked collard greens. Turnip greens. Broccoli. Kale. Almonds. White beans. Corn tortilla. Foods  high in vitamin D Cod liver oil. Fatty fish, such as tuna, mackerel, and salmon. Milk. Fortified soy milk. Fortified fruit juice. Yogurt. Margarine. Egg yolks. Foods high in protein Beef. Lamb. Pork tenderloin. Chicken breast. Tuna (canned). Fish fillet. Tofu. Cooked soy beans. Soy patty. Beans (canned or cooked). Cottage cheese. Yogurt. Peanut butter. Pumpkin seeds. Nuts. Sunflower seeds. Hard cheese. Milk or other milk products, such as soy milk. The items listed above may not be a complete list of foods and beverages you can eat. Contact a dietitian for more options. Summary Calcium and vitamin D are nutrients that play an important role in bone health and are an important part of your eating plan for osteoporosis. Eat balanced meals that contain protein foods, fruits and vegetables, and foods rich in calcium and vitamin D. Avoid foods that have more than 300 mg of sodium per serving. Too much sodium can cause your body to lose calcium. Exercise is an important part of prevention and treatment of osteoporosis. Aim for at least 30 minutes a day, 5 days a week. This information is not intended to replace advice given to you by your health care provider. Make sure you discuss any questions you have with your health care provider. Document Revised: 10/27/2019 Document Reviewed: 10/27/2019 Elsevier Patient Education  [place].

## 2021-07-16 NOTE — Chronic Care Management (AMB) (Signed)
Chronic Care Management   CCM RN Visit Note  07/16/2021 Name: [name] MRN: [id number] DOB: [birth year]  Subjective: [name] is a 71 y.o. year old female who is a primary care patient of Moshe Cipro Norwood Levo, MD. The care management team was consulted for assistance with disease management and care coordination needs.    Engaged with patient by telephone for follow up visit in response to provider referral for case management and/or care coordination services.   Consent to Services:  The patient was given information about Chronic Care Management services, agreed to services, and gave verbal consent prior to initiation of services.  Please see initial visit note for detailed documentation.   Patient agreed to services and verbal consent obtained.   Assessment: Review of patient past medical history, allergies, medications, health status, including review of consultants reports, laboratory and other test data, was performed as part of comprehensive evaluation and provision of chronic care management services.   SDOH (Social Determinants of Health) assessments and interventions performed:    CCM Care Plan  Allergies  Allergen Reactions   Septra [Sulfamethoxazole-Trimethoprim] Dermatitis   Barbiturates Other (See Comments)    Prophyria    Chlordiazepoxide Other (See Comments)    unknown   Hydrocodone-Acetaminophen Hives and Other (See Comments)   Pentothal [Thiopental]     Hives?   Phenytoin Other (See Comments)    Prophyria    Rofecoxib Other (See Comments)    Prophyria    Cefuroxime Axetil Rash   Other    Wellbutrin [Bupropion] Palpitations    Outpatient Encounter Medications as of 07/16/2021  Medication Sig Note   acetaminophen (TYLENOL) 500 MG tablet Take 1,000 mg by mouth every 6 (six) hours as needed for moderate pain.    albuterol (VENTOLIN HFA) 108 (90 Base) MCG/ACT inhaler Inhale 2 puffs into the lungs every 6 (six) hours as needed for wheezing or  shortness of breath.    ALPRAZolam (XANAX) 0.25 MG tablet Take one tablet by mouth once daily, as needed, for anxiety    aspirin 81 MG EC tablet Take 1 tablet (81 mg total) by mouth daily.    Calcium Carbonate-Vitamin D 600-400 MG-UNIT tablet Take 1 tablet by mouth daily.    Cholecalciferol (VITAMIN D3) 5000 units TABS Take 5,000 Units by mouth daily.    Coenzyme Q10 100 MG capsule Take 100 mg by mouth 3 (three) times daily.    EPINEPHrine 0.3 mg/0.3 mL IJ SOAJ injection Inject 0.3 mg into the muscle as needed for anaphylaxis.    fluticasone (FLONASE) 50 MCG/ACT nasal spray Place 2 sprays into both nostrils daily. (Patient taking differently: Place 2 sprays into both nostrils daily as needed for allergies.)    gabapentin (NEURONTIN) 100 MG capsule Take 1 capsule (100 mg total) by mouth at bedtime.    hydroxychloroquine (PLAQUENIL) 200 MG tablet Take 400 mg by mouth daily.     loratadine (CLARITIN) 10 MG tablet Take 10 mg by mouth daily.    losartan (COZAAR) 50 MG tablet Take 1 tablet (50 mg total) by mouth daily.    montelukast (SINGULAIR) 10 MG tablet Take 1 tablet (10 mg total) by mouth at bedtime.    Multiple Vitamins-Minerals (CENTRUM SILVER PO) Take 1 tablet by mouth daily.    Omega-3 Fatty Acids (FISH OIL) 1000 MG CAPS Take 1 capsule (1,000 mg total) by mouth daily.    omeprazole (PRILOSEC) 20 MG capsule Take 20 mg by mouth at bedtime.    pravastatin (PRAVACHOL) 10  MG tablet TAKE 1 TABLET DAILY STOP   TAKING CRESTOR (Patient taking differently: Take 10 mg by mouth 4 (four) times a week.) 03/22/2021: Takes 5 days per week   Turmeric 500 MG CAPS Take 1 capsule by mouth.    vitamin B-12 (CYANOCOBALAMIN) 50 MCG tablet Take 50 mcg by mouth daily.    Zinc 50 MG TABS Take 50 mg by mouth daily.    alendronate (FOSAMAX) 70 MG tablet Take 1 tablet (70 mg total) by mouth every 7 (seven) days. Take with a full glass of water on an empty stomach. (Patient not taking: Reported on 07/16/2021)    No  facility-administered encounter medications on file as of 07/16/2021.    Patient Active Problem List   Diagnosis Date Noted   Bilateral shoulder pain 07/18/2020   Stiffness of right knee 07/18/2020   Insomnia due to anxiety and fear 07/03/2019   MALT lymphoma ([place]) 06/07/2019   Osteoporosis 04/28/2018   Abnormal CT scan, chest 06/10/2016   Rheumatoid arthritis ([place]) 05/13/2016   Annual physical exam 04/12/2015   GERD (gastroesophageal reflux disease) 03/24/2014   Obesity (BMI 30-39.9) 40/76/8088   Metabolic syndrome X 03/28/1593   Prediabetes 09/28/2011   Allergic rhinitis 09/04/2009   Hyperlipidemia LDL goal <100 08/12/2007   Essential hypertension 08/12/2007    Conditions to be addressed/monitored:HTN and HLD  Care Plan : RN Care Manager plan of care  Updates made by Kassie Mends, RN since 07/16/2021 12:00 AM     Problem: No plan of care established for management of chronic disease states (HTN, HLD, Lymphoma)   Priority: High     Long-Range Goal: Develoment of Plan of Care for chronic disease management (HTN, HLD, Lymphoma)   Start Date: 04/23/2021  Expected End Date: 10/20/2021  Priority: High  Note:   Current Barriers:  Knowledge Deficits related to plan of care for management of HTN and HLD  Patient reports she lives alone and has sisters nearby who can assist her if needed, pt still drives, is independent with all aspects of her care, has cane to use if needed, pt reports she has arthritis and this hinders her ability to exercise, she walks in her house sometimes and also stretches.  Patient checks blood pressure 2-3 times per month, reports readings have been within normal limits, has advanced directives in place. Patient reports she had DEXA scan and followed up with Dr. Dorris Fetch and has diagnosis osteoporosis, has quit taking fosamax because IV Reclast or Bisphosphonate has been recommended and pt reports she never heard from the referral for the IV. Poorly controlled  hyperlipidemia, complicated by diet, lack of consistent exercise (unable due to rheumatoid arthritis) Current antihyperlipidemic regimen: pravastatin Continues following up with oncology- Dr. Delton Coombes- for lymphoma, to have repeat CT scan in several months  RNCM Clinical Goal(s):  Patient will verbalize understanding of plan for management of HTN and HLD as evidenced by patient report, review of EHR and  through collaboration with RN Care manager, provider, and care team.   Interventions: 1:1 collaboration with primary care provider regarding development and update of comprehensive plan of care as evidenced by provider attestation and co-signature Inter-disciplinary care team collaboration (see longitudinal plan of care) Evaluation of current treatment plan related to  self management and patient's adherence to plan as established by provider  Hyperlipidemia Interventions:  (Status:  Goal on track:  Yes.) Long Term Goal Medication review performed; medication list updated in electronic medical record.  Counseled on importance of regular laboratory  monitoring as prescribed Reviewed role and benefits of statin for ASCVD risk reduction Reviewed importance of limiting foods high in cholesterol Pain assessment completed Reinforced heart healthy diet, reading labels In basket sent to Dr. Dorris Fetch requesting someone follow up with pt about referral for IV Reclast/ Bisphosphonate  Hypertension Interventions:  (Status:  Goal on track:  Yes.) Long Term Goal Last practice recorded BP readings:  BP Readings from Last 3 Encounters:  04/08/21 (!) 142/88  03/22/21 134/80  01/11/21 138/84  Most recent eGFR/CrCl: No results found for: EGFR  No components found for: CRCL  Evaluation of current treatment plan related to hypertension self management and patient's adherence to plan as established by provider Advised patient, providing education and rationale, to monitor blood pressure daily and record,  calling PCP for findings outside established parameters Reinforced low sodium diet, limiting fast food Reviewed upcoming scheduled appointments 4/28 primary care provider  Patient Goals/Self-Care Activities: Take all medications as prescribed Attend all scheduled provider appointments Call pharmacy for medication refills 3-7 days in advance of running out of medications Perform all self care activities independently  Perform IADL's (shopping, preparing meals, housekeeping, managing finances) independently Call provider office for new concerns or questions  check blood pressure weekly choose a place to take my blood pressure (home, clinic or office, retail store) write blood pressure results in a log or diary keep a blood pressure log take blood pressure log to all doctor appointments take medications for blood pressure exactly as prescribed take all medications exactly as prescribed call doctor with any symptoms you believe are related to your medicine call doctor when you experience any new symptoms adhere to prescribed diet: heart healthy Follow low sodium, heart healthy diet- read labels Take all medications as prescribed Message sent to Dr. Dorris Fetch about referral for IV Reclast, please follow up with his office if you have not heard anything        Plan:Telephone follow up appointment with care management team member scheduled for:  07/25/21  Jacqlyn Larsen [facility], BSN RN Case Manager [facility] [phone]

## 2021-07-17 ENCOUNTER — Telehealth: Payer: Self-pay

## 2021-07-17 NOTE — Telephone Encounter (Signed)
Left a message requesting pt to return call to the office. 

## 2021-07-17 NOTE — Telephone Encounter (Signed)
-----   Message from Cassandria Anger, MD sent at 07/16/2021  2:31 PM EST ----- Regarding: FW: referral Bronte Sabado,  Can you take a lot at this? Thanks.  ----- Message ----- From: Kassie Mends, RN Sent: 07/16/2021   2:30 PM EST To: Cassandria Anger, MD Subject: referral                                       I spoke w/ pt today and she reports she did not hear anything about referral for IV Reclast/ Bisphosphonate,  she has stopped taking fosamax.  Can someone from your office please follow up with her about referral.     thanks

## 2021-07-23 DIAGNOSIS — E785 Hyperlipidemia, unspecified: Secondary | ICD-10-CM | POA: Diagnosis not present

## 2021-07-23 DIAGNOSIS — I10 Essential (primary) hypertension: Secondary | ICD-10-CM

## 2021-07-25 ENCOUNTER — Ambulatory Visit (INDEPENDENT_AMBULATORY_CARE_PROVIDER_SITE_OTHER): Payer: Medicare Other | Admitting: *Deleted

## 2021-07-25 DIAGNOSIS — I10 Essential (primary) hypertension: Secondary | ICD-10-CM

## 2021-07-25 DIAGNOSIS — E785 Hyperlipidemia, unspecified: Secondary | ICD-10-CM

## 2021-07-25 NOTE — Patient Instructions (Signed)
Visit Information ? ?Thank you for taking time to visit with me today. Please don't hesitate to contact me if I can be of assistance to you before our next scheduled telephone appointment. ? ?Following are the goals we discussed today:  ?Please follow up with Dr. Liliane Channel office about referral for IV Reclast ? ?Our next appointment is by telephone on 10/01/21 at 3 pm ? ?Please call the care guide team at [phone] if you need to cancel or reschedule your appointment.  ? ?If you are experiencing a Mental Health or [place] or need someone to talk to, please call the [place] National Suicide Prevention Lifeline: 216-314-9656 or TTY: 701-321-6377 TTY [phone]) to talk to a trained counselor ?call 1-800-273-TALK (toll free, 24 hour hotline) ?go to [facility] [address] [place] [phone]) ?call the [facility]: [phone] ?call 911  ? ?Patient verbalizes understanding of instructions and care plan provided today and agrees to view in [place]. Active MyChart status confirmed with patient.   ? ?Jacqlyn Larsen RNC, BSN ?RN Case Manager ?[place] ?[phone] ? ?

## 2021-07-25 NOTE — Chronic Care Management (AMB) (Signed)
Chronic Care Management   CCM RN Visit Note  07/25/2021 Name: [name] MRN: [id number] DOB: Jun 03, 1950  Subjective: [name] is a 71 y.o. year old female who is a primary care patient of Moshe Cipro Norwood Levo, MD. The care management team was consulted for assistance with disease management and care coordination needs.    Engaged with patient by telephone for follow up visit in response to provider referral for case management and/or care coordination services.   Consent to Services:  The patient was given information about Chronic Care Management services, agreed to services, and gave verbal consent prior to initiation of services.  Please see initial visit note for detailed documentation.   Patient agreed to services and verbal consent obtained.   Assessment: Review of patient past medical history, allergies, medications, health status, including review of consultants reports, laboratory and other test data, was performed as part of comprehensive evaluation and provision of chronic care management services.   SDOH (Social Determinants of Health) assessments and interventions performed:    CCM Care Plan  Allergies  Allergen Reactions   Septra [Sulfamethoxazole-Trimethoprim] Dermatitis   Barbiturates Other (See Comments)    Prophyria    Chlordiazepoxide Other (See Comments)    unknown   Hydrocodone-Acetaminophen Hives and Other (See Comments)   Pentothal [Thiopental]     Hives?   Phenytoin Other (See Comments)    Prophyria    Rofecoxib Other (See Comments)    Prophyria    Cefuroxime Axetil Rash   Other    Wellbutrin [Bupropion] Palpitations    Outpatient Encounter Medications as of 07/25/2021  Medication Sig Note   acetaminophen (TYLENOL) 500 MG tablet Take 1,000 mg by mouth every 6 (six) hours as needed for moderate pain.    albuterol (VENTOLIN HFA) 108 (90 Base) MCG/ACT inhaler Inhale 2 puffs into the lungs every 6 (six) hours as needed for wheezing or shortness  of breath.    alendronate (FOSAMAX) 70 MG tablet Take 1 tablet (70 mg total) by mouth every 7 (seven) days. Take with a full glass of water on an empty stomach. (Patient not taking: Reported on 07/16/2021)    ALPRAZolam (XANAX) 0.25 MG tablet Take one tablet by mouth once daily, as needed, for anxiety    aspirin 81 MG EC tablet Take 1 tablet (81 mg total) by mouth daily.    Calcium Carbonate-Vitamin D 600-400 MG-UNIT tablet Take 1 tablet by mouth daily.    Cholecalciferol (VITAMIN D3) 5000 units TABS Take 5,000 Units by mouth daily.    Coenzyme Q10 100 MG capsule Take 100 mg by mouth 3 (three) times daily.    EPINEPHrine 0.3 mg/0.3 mL IJ SOAJ injection Inject 0.3 mg into the muscle as needed for anaphylaxis.    fluticasone (FLONASE) 50 MCG/ACT nasal spray Place 2 sprays into both nostrils daily. (Patient taking differently: Place 2 sprays into both nostrils daily as needed for allergies.)    gabapentin (NEURONTIN) 100 MG capsule Take 1 capsule (100 mg total) by mouth at bedtime.    hydroxychloroquine (PLAQUENIL) 200 MG tablet Take 400 mg by mouth daily.     loratadine (CLARITIN) 10 MG tablet Take 10 mg by mouth daily.    losartan (COZAAR) 50 MG tablet Take 1 tablet (50 mg total) by mouth daily.    montelukast (SINGULAIR) 10 MG tablet Take 1 tablet (10 mg total) by mouth at bedtime.    Multiple Vitamins-Minerals (CENTRUM SILVER PO) Take 1 tablet by mouth daily.    Omega-3  Fatty Acids (FISH OIL) 1000 MG CAPS Take 1 capsule (1,000 mg total) by mouth daily.    omeprazole (PRILOSEC) 20 MG capsule Take 20 mg by mouth at bedtime.    pravastatin (PRAVACHOL) 10 MG tablet TAKE 1 TABLET DAILY STOP   TAKING CRESTOR (Patient taking differently: Take 10 mg by mouth 4 (four) times a week.) 03/22/2021: Takes 5 days per week   Turmeric 500 MG CAPS Take 1 capsule by mouth.    vitamin B-12 (CYANOCOBALAMIN) 50 MCG tablet Take 50 mcg by mouth daily.    Zinc 50 MG TABS Take 50 mg by mouth daily.    No  facility-administered encounter medications on file as of 07/25/2021.    Patient Active Problem List   Diagnosis Date Noted   Bilateral shoulder pain 07/18/2020   Stiffness of right knee 07/18/2020   Insomnia due to anxiety and fear 07/03/2019   MALT lymphoma ([place]) 06/07/2019   Osteoporosis 04/28/2018   Abnormal CT scan, chest 06/10/2016   Rheumatoid arthritis ([place]) 05/13/2016   Annual physical exam 04/12/2015   GERD (gastroesophageal reflux disease) 03/24/2014   Obesity (BMI 30-39.9) 85/06/7739   Metabolic syndrome X 28/78/6767   Prediabetes 09/28/2011   Allergic rhinitis 09/04/2009   Hyperlipidemia LDL goal <100 08/12/2007   Essential hypertension 08/12/2007    Conditions to be addressed/monitored:HTN, HLD, and Osteoporosis  Care Plan : RN Care Manager plan of care  Updates made by Kassie Mends, RN since 07/25/2021 12:00 AM     Problem: No plan of care established for management of chronic disease states (HTN, HLD, Lymphoma)   Priority: High     Long-Range Goal: Develoment of Plan of Care for chronic disease management (HTN, HLD, Lymphoma)   Start Date: 04/23/2021  Expected End Date: 10/20/2021  Priority: High  Note:   Current Barriers:  Knowledge Deficits related to plan of care for management of HTN and HLD  Patient reports she lives alone and has sisters nearby who can assist her if needed, pt still drives, is independent with all aspects of her care, has cane to use if needed, pt reports she has arthritis and this hinders her ability to exercise, she walks in her house sometimes and also stretches.  Patient checks blood pressure 2-3 times per month, reports readings have been within normal limits, has advanced directives in place. Patient reports she had DEXA scan and followed up with Dr. Dorris Fetch and has diagnosis osteoporosis, has quit taking fosamax because IV Reclast or Bisphosphonate has been recommended and pt reports she never heard from the referral for the IV. Poorly  controlled hyperlipidemia, complicated by diet, lack of consistent exercise (unable due to rheumatoid arthritis) Current antihyperlipidemic regimen: pravastatin Continues following up with oncology- Dr. Delton Coombes- for lymphoma, to have repeat CT scan in several months Telephone call with patient to let her know endocrinologist office is checking with insurance company to confirm if pt needs prior authorization and then they will send order over for IV Reclast.  Patient reports she has not heard from anyone yet and will check back with her endocrinologist in few days.  RNCM Clinical Goal(s):  Patient will verbalize understanding of plan for management of HTN and HLD as evidenced by patient report, review of EHR and  through collaboration with RN Care manager, provider, and care team.   Interventions: 1:1 collaboration with primary care provider regarding development and update of comprehensive plan of care as evidenced by provider attestation and co-signature Inter-disciplinary care team collaboration (see longitudinal  plan of care) Evaluation of current treatment plan related to  self management and patient's adherence to plan as established by provider  Hyperlipidemia Interventions:  (Status:  Goal on track:  Yes.) Long Term Goal Medication review performed; medication list updated in electronic medical record.  Counseled on importance of regular laboratory monitoring as prescribed Reviewed role and benefits of statin for ASCVD risk reduction Reviewed importance of limiting foods high in cholesterol Pain assessment completed Reinforced heart healthy diet, reading labels In basket sent to Dr. Dorris Fetch requesting someone follow up with pt about referral for IV Reclast/ Bisphosphonate  Hypertension Interventions:  (Status:  Goal on track:  Yes.) Long Term Goal Last practice recorded BP readings:  BP Readings from Last 3 Encounters:  04/08/21 (!) 142/88  03/22/21 134/80  01/11/21 138/84  Most  recent eGFR/CrCl: No results found for: EGFR  No components found for: CRCL  Evaluation of current treatment plan related to hypertension self management and patient's adherence to plan as established by provider Advised patient, providing education and rationale, to monitor blood pressure daily and record, calling PCP for findings outside established parameters Reinforced low sodium diet, limiting fast food Reviewed upcoming scheduled appointments 4/28 primary care provider Reviewed with pt endocrinologist is working on the referral for IV Reclast and making sure about prior authorization  Patient Goals/Self-Care Activities: Take all medications as prescribed Attend all scheduled provider appointments Call pharmacy for medication refills 3-7 days in advance of running out of medications Perform all self care activities independently  Perform IADL's (shopping, preparing meals, housekeeping, managing finances) independently Call provider office for new concerns or questions  check blood pressure weekly choose a place to take my blood pressure (home, clinic or office, retail store) write blood pressure results in a log or diary keep a blood pressure log take blood pressure log to all doctor appointments take medications for blood pressure exactly as prescribed take all medications exactly as prescribed call doctor with any symptoms you believe are related to your medicine call doctor when you experience any new symptoms adhere to prescribed diet: heart healthy Follow low sodium, heart healthy diet- read labels Take all medications as prescribed Please follow up with Dr. Liliane Channel office about referral for IV Reclast        Plan:Telephone follow up appointment with care management team member scheduled for:  10/01/21   Jacqlyn Larsen [facility], BSN RN Case Manager [facility] [phone]

## 2021-08-10 ENCOUNTER — Other Ambulatory Visit: Payer: Self-pay | Admitting: Family Medicine

## 2021-08-10 DIAGNOSIS — E785 Hyperlipidemia, unspecified: Secondary | ICD-10-CM

## 2021-08-23 DIAGNOSIS — I10 Essential (primary) hypertension: Secondary | ICD-10-CM | POA: Diagnosis not present

## 2021-08-23 DIAGNOSIS — E785 Hyperlipidemia, unspecified: Secondary | ICD-10-CM

## 2021-09-03 ENCOUNTER — Other Ambulatory Visit: Payer: Self-pay

## 2021-09-03 ENCOUNTER — Telehealth: Payer: Self-pay | Admitting: Pharmacy Technician

## 2021-09-03 ENCOUNTER — Telehealth: Payer: Self-pay

## 2021-09-03 DIAGNOSIS — M818 Other osteoporosis without current pathological fracture: Secondary | ICD-10-CM

## 2021-09-03 MED ORDER — ALENDRONATE SODIUM 70 MG PO TABS: 70.0000 mg | ORAL_TABLET | ORAL | 0 refills | Status: DC

## 2021-09-03 NOTE — Telephone Encounter (Signed)
Dr. Concha Se, ?Fyi note: ? ?Auth Submission: no auth needed ?Payer: UHC / BCBS ?Medication & CPT/J Code(s) submitted: Reclast (Zolendronic acid) J3489 ?Route of submission (phone, fax, portal): PHONE ?Auth type: Buy/Bill ?Units/visits requested: X1 DOSE ?Reference number: [id number] - [employer]HC ?Kim-P 09/03/21 @ 3:42 - bcbs ?Approval from: 09/03/21 to 11/03/21 ? ?Patient will be scheduled as soon as possible. ?

## 2021-09-03 NOTE — Telephone Encounter (Signed)
-----   Message from Cassandria Anger, MD sent at 09/03/2021  3:31 PM EDT ----- ?Regarding: RE: referral ?Yes, she can restart Fosama x, and keep it going up until the time of her Reclast infusion. ?----- Message ----- ?From: Ellin Saba, LPN ?Sent: 09/03/2021   1:41 PM EDT ?To: Cassandria Anger, MD ?Subject: RE: referral                                  ? ?We had sent the order for Reclast infusion to [facility] on in February but pt was never scheduled and was told they no longer performed the infusion at [facility]. I tried to call pt back in February but had to leave a message, she stated she never received the message. I have refaxed the order over to the [facility]. They stated once pharmacy is able to determine if reclast needs authorization the [facility] will contact the pt. She has not taken any fosamax since her visit with [place] in February and she stated it will probably be the first part of May before she is able to have the infusion due to her other previously scheduled appointments. She asked if she needs to go ahead and start taking fosamax until she can get scheduled for infusion. ?----- Message ----- ?From: Cassandria Anger, MD ?Sent: 09/03/2021  10:17 AM EDT ?To: Ellin Saba, LPN ?Subject: FW: referral                                  ? ?Autumnrose Yore,, ?Can you take a look at this.Thanks. ?----- Message ----- ?From: Kassie Mends, RN ?Sent: 07/16/2021   2:30 PM EDT ?To: Cassandria Anger, MD ?Subject: referral                                      ? ?I spoke w/ pt today and she reports she did not hear anything about referral for IV Reclast/ Bisphosphonate,  she has stopped taking fosamax.  Can someone from your office please follow up with her about referral.     thanks ? ? ? ? ?

## 2021-09-03 NOTE — Telephone Encounter (Signed)
Made pt aware we have faxed order for her reclast infusion to [facility] and they will get in touch with her to schedule infusion once the pharmacy obtains authorization from insurance company, also she can take fosamax weekly until her infusion per Dr.Nida. Pt voiced understanding. Rx for fosamax '70mg'$  once a week sent to [facility] in [place]. ?

## 2021-09-04 ENCOUNTER — Encounter: Payer: Self-pay | Admitting: Family Medicine

## 2021-09-04 ENCOUNTER — Ambulatory Visit (INDEPENDENT_AMBULATORY_CARE_PROVIDER_SITE_OTHER): Payer: Medicare Other | Admitting: Family Medicine

## 2021-09-04 VITALS — BP 136/83 | HR 90 | Resp 16 | Ht 67.0 in | Wt 204.0 lb

## 2021-09-04 DIAGNOSIS — E669 Obesity, unspecified: Secondary | ICD-10-CM | POA: Diagnosis not present

## 2021-09-04 DIAGNOSIS — E785 Hyperlipidemia, unspecified: Secondary | ICD-10-CM

## 2021-09-04 DIAGNOSIS — E559 Vitamin D deficiency, unspecified: Secondary | ICD-10-CM | POA: Diagnosis not present

## 2021-09-04 DIAGNOSIS — I10 Essential (primary) hypertension: Secondary | ICD-10-CM | POA: Diagnosis not present

## 2021-09-04 DIAGNOSIS — M818 Other osteoporosis without current pathological fracture: Secondary | ICD-10-CM

## 2021-09-04 DIAGNOSIS — E8881 Metabolic syndrome: Secondary | ICD-10-CM

## 2021-09-04 NOTE — Patient Instructions (Addendum)
Annual exam 10/29 or after, call if you need me sooner ? ?Please do get the shingrix vaccines ? ?Send message after your chest scan so I can follow up on this ? ?Fasting CBC, lipid, cmp and eGFr, TSH and vit D 1 week before October visit ? ?Continue community involvement  and listen  to your body ? ?It is important that you exercise regularly at least 30 minutes 5 times a week. If you develop chest pain, have severe difficulty breathing, or feel very tired, stop exercising immediately and seek medical attention  ? ?Think about what you will eat, plan ahead. ?Choose " clean, green, fresh or frozen" over canned, processed or packaged foods which are more sugary, salty and fatty. ?70 to 75% of food eaten should be vegetables and fruit. ?Three meals at set times with snacks allowed between meals, but they must be fruit or vegetables. ?Aim to eat over a 12 hour period , example 7 am to 7 pm, and STOP after  your last meal of the day. ?Drink water,generally about 64 ounces per day, no other drink is as healthy. Fruit juice is best enjoyed in a healthy way, by EATING the fruit. ? ?Thanks for choosing [facility], we consider it a privelige to serve you. ? ?

## 2021-09-08 ENCOUNTER — Encounter: Payer: Self-pay | Admitting: Family Medicine

## 2021-09-08 NOTE — Assessment & Plan Note (Signed)
?  Patient re-educated about  the importance of commitment to a  minimum of 150 minutes of exercise per week as able. ? ?The importance of healthy food choices with portion control discussed, as well as eating regularly and within a 12 hour window most days. ?The need to choose "clean , green" food 50 to 75% of the time is discussed, as well as to make water the primary drink and set a goal of 64 ounces water daily. ? ?  ? ?  09/04/2021  ?  2:26 PM 06/18/2021  ?  1:33 PM 04/08/2021  ? 10:45 AM  ?Weight /BMI  ?Weight 204 lb 204 lb 3.2 oz 208 lb 14.4 oz  ?Height '5\' 7"'$  (1.702 m) '5\' 7"'$  (1.702 m)   ?BMI 31.95 kg/m2 31.98 kg/m2 32.72 kg/m2  ? ? ? ?

## 2021-09-08 NOTE — Progress Notes (Signed)
? ?[name]     MRN: [id number]      DOB: 08/12/1950 ? ? ?HPI ?[name] is here for follow up and re-evaluation of chronic medical conditions, medication management and review of any available recent lab and radiology data.  ?Preventive health is updated, specifically  Cancer screening and Immunization.   ?Questions or concerns regarding consultations or procedures which the PT has had in the interim are  addressed. ?The PT denies any adverse reactions to current medications since the last visit.  ?Has ches scan upcoming  ? ?ROS ?Denies recent fever or chills. ?Denies sinus pressure, nasal congestion, ear pain or sore throat. ?Denies chest congestion, productive cough or wheezing. ?Denies chest pains, palpitations and leg swelling ?Denies abdominal pain, nausea, vomiting,diarrhea or constipation.   ?Denies dysuria, frequency, hesitancy or incontinence. ?Denies joint pain, swelling and limitation in mobility. ?Denies headaches, seizures, numbness, or tingling. ?Denies depression, anxiety or insomnia. ?Denies skin break down or rash. ? ? ?PE ? ?BP 136/83   Pulse 90   Resp 16   Ht '5\' 7"'$  (1.702 m)   Wt 204 lb (92.5 kg)   SpO2 98%   BMI 31.95 kg/m?  ? ?Patient alert and oriented and in no cardiopulmonary distress. ? ?HEENT: No facial asymmetry, EOMI,     Neck supple . ? ?Chest: Clear to auscultation bilaterally. ? ?CVS: S1, S2 no murmurs, no S3.Regular rate. ? ?ABD: Soft non tender.  ? ?Ext: No edema ? ?MS: Adequate ROM spine, shoulders, hips and knees. ? ?Skin: Intact, no ulcerations or rash noted. ? ?Psych: Good eye contact, normal affect. Memory intact not anxious or depressed appearing. ? ?CNS: CN 2-12 intact, power,  normal throughout.no focal deficits noted. ? ? ?Assessment & Plan ? ?Essential hypertension ?DASH diet and commitment to daily physical activity for a minimum of 30 minutes discussed and encouraged, as a part of hypertension management. ?The importance of attaining a healthy weight is also  discussed. ? ? ?  09/04/2021  ?  2:26 PM 06/18/2021  ?  1:33 PM 04/08/2021  ? 10:45 AM 03/22/2021  ? 10:39 AM 03/22/2021  ? 10:00 AM 01/11/2021  ?  1:14 PM 10/02/2020  ?  3:22 PM  ?BP/Weight  ?Systolic BP 010 272 536 644 147 138 130  ?Diastolic BP 83 86 88 80 80 84 83  ?Wt. (Lbs) 204 204.2 208.9  208.04 210 207  ?BMI 31.95 kg/m2 31.98 kg/m2 32.72 kg/m2  32.58 kg/m2 32.89 kg/m2 32.42 kg/m2  ? ? ? ?Controlled, no change in medication ? ? ?Hyperlipidemia LDL goal <100 ?Hyperlipidemia:Low fat diet discussed and encouraged. ? ? ?Lipid Panel  ?Lab Results  ?Component Value Date  ? CHOL 186 03/20/2021  ? HDL 54 03/20/2021  ? LDLCALC 114 (H) 03/20/2021  ? TRIG 99 03/20/2021  ? CHOLHDL 3.4 03/20/2021  ? ? ? ?Needs ro reduce fried and fatty foods ?Updated lab needed at/ before next visit. ? ? ?Obesity (BMI 30-39.9) ? ?Patient re-educated about  the importance of commitment to a  minimum of 150 minutes of exercise per week as able. ? ?The importance of healthy food choices with portion control discussed, as well as eating regularly and within a 12 hour window most days. ?The need to choose "clean , green" food 50 to 75% of the time is discussed, as well as to make water the primary drink and set a goal of 64 ounces water daily. ? ?  ? ?  09/04/2021  ?  2:26 PM  06/18/2021  ?  1:33 PM 04/08/2021  ? 10:45 AM  ?Weight /BMI  ?Weight 204 lb 204 lb 3.2 oz 208 lb 14.4 oz  ?Height '5\' 7"'$  (1.702 m) '5\' 7"'$  (1.702 m)   ?BMI 31.95 kg/m2 31.98 kg/m2 32.72 kg/m2  ? ? ? ? ?Metabolic syndrome X ?The increased risk of cardiovascular disease associated with this diagnosis, and the need to consistently work on lifestyle to change this is discussed. ?Following  a  heart healthy diet ,commitment to 30 minutes of exercise at least 5 days per week, as well as control of blood sugar and cholesterol , and achieving a healthy weight are all the areas to be addressed . ? ? ?Osteoporosis ?Being treated through Endo and will start IV therapy ? ?

## 2021-09-08 NOTE — Assessment & Plan Note (Signed)
Hyperlipidemia:Low fat diet discussed and encouraged. ? ? ?Lipid Panel  ?Lab Results  ?Component Value Date  ? CHOL 186 03/20/2021  ? HDL 54 03/20/2021  ? LDLCALC 114 (H) 03/20/2021  ? TRIG 99 03/20/2021  ? CHOLHDL 3.4 03/20/2021  ? ? ? ?Needs ro reduce fried and fatty foods ?Updated lab needed at/ before next visit. ? ?

## 2021-09-08 NOTE — Assessment & Plan Note (Signed)
Being treated through Endo and will start IV therapy ?

## 2021-09-08 NOTE — Assessment & Plan Note (Signed)
The increased risk of cardiovascular disease associated with this diagnosis, and the need to consistently work on lifestyle to change this is discussed. Following  a  heart healthy diet ,commitment to 30 minutes of exercise at least 5 days per week, as well as control of blood sugar and cholesterol , and achieving a healthy weight are all the areas to be addressed .  

## 2021-09-08 NOTE — Assessment & Plan Note (Signed)
DASH diet and commitment to daily physical activity for a minimum of 30 minutes discussed and encouraged, as a part of hypertension management. ?The importance of attaining a healthy weight is also discussed. ? ? ?  09/04/2021  ?  2:26 PM 06/18/2021  ?  1:33 PM 04/08/2021  ? 10:45 AM 03/22/2021  ? 10:39 AM 03/22/2021  ? 10:00 AM 01/11/2021  ?  1:14 PM 10/02/2020  ?  3:22 PM  ?BP/Weight  ?Systolic BP 539 672 897 915 147 138 130  ?Diastolic BP 83 86 88 80 80 84 83  ?Wt. (Lbs) 204 204.2 208.9  208.04 210 207  ?BMI 31.95 kg/m2 31.98 kg/m2 32.72 kg/m2  32.58 kg/m2 32.89 kg/m2 32.42 kg/m2  ? ? ? ?Controlled, no change in medication ? ?

## 2021-09-17 ENCOUNTER — Ambulatory Visit (INDEPENDENT_AMBULATORY_CARE_PROVIDER_SITE_OTHER): Payer: Medicare Other

## 2021-09-17 VITALS — BP 124/83 | HR 76 | Temp 98.4°F | Resp 18 | Ht 67.0 in | Wt 207.4 lb

## 2021-09-17 DIAGNOSIS — M818 Other osteoporosis without current pathological fracture: Secondary | ICD-10-CM | POA: Diagnosis not present

## 2021-09-17 MED ORDER — ACETAMINOPHEN 325 MG PO TABS: 650.0000 mg | ORAL_TABLET | Freq: Once | ORAL | Status: DC

## 2021-09-17 MED ORDER — DIPHENHYDRAMINE HCL 25 MG PO CAPS: 25.0000 mg | ORAL_CAPSULE | Freq: Once | ORAL | Status: DC

## 2021-09-17 MED ORDER — SODIUM CHLORIDE 0.9 % IV SOLN: INTRAVENOUS | Status: DC

## 2021-09-17 MED ORDER — ZOLEDRONIC ACID 5 MG/100ML IV SOLN
5.0000 mg | Freq: Once | INTRAVENOUS | Status: AC
  Administered 2021-09-17: 5 mg via INTRAVENOUS
  Filled 2021-09-17: qty 100

## 2021-09-17 NOTE — Progress Notes (Signed)
Diagnosis: osteoporosis ? ?Provider:  Marshell Garfinkel, MD ? ?Procedure: Infusion ? ?IV Type: Peripheral, IV Location: L Hand ? ?Reclast (Zolendronic Acid),  Dose: 5 mg ? ?Infusion Start Time: 1016 ? ?Infusion Stop Time: 1050 ? ?Post Infusion IV Care: Observation period completed and Peripheral IV Discontinued ? ?Discharge: Condition: Good, Destination: Home . AVS provided to patient.  ? ?Performed by:  Cleophus Molt, RN  ?  ?

## 2021-09-17 NOTE — Patient Instructions (Addendum)

## 2021-09-18 DIAGNOSIS — H18413 Arcus senilis, bilateral: Secondary | ICD-10-CM | POA: Diagnosis not present

## 2021-09-18 DIAGNOSIS — Z79899 Other long term (current) drug therapy: Secondary | ICD-10-CM | POA: Diagnosis not present

## 2021-09-18 DIAGNOSIS — H35372 Puckering of macula, left eye: Secondary | ICD-10-CM | POA: Diagnosis not present

## 2021-09-18 DIAGNOSIS — H2513 Age-related nuclear cataract, bilateral: Secondary | ICD-10-CM | POA: Diagnosis not present

## 2021-09-18 DIAGNOSIS — M069 Rheumatoid arthritis, unspecified: Secondary | ICD-10-CM | POA: Diagnosis not present

## 2021-09-18 DIAGNOSIS — H5203 Hypermetropia, bilateral: Secondary | ICD-10-CM | POA: Diagnosis not present

## 2021-09-20 ENCOUNTER — Ambulatory Visit: Payer: Medicare Other | Admitting: Family Medicine

## 2021-10-01 ENCOUNTER — Ambulatory Visit (INDEPENDENT_AMBULATORY_CARE_PROVIDER_SITE_OTHER): Payer: Medicare Other | Admitting: *Deleted

## 2021-10-01 DIAGNOSIS — I10 Essential (primary) hypertension: Secondary | ICD-10-CM

## 2021-10-01 DIAGNOSIS — E785 Hyperlipidemia, unspecified: Secondary | ICD-10-CM

## 2021-10-01 NOTE — Patient Instructions (Signed)
Visit Information ? ?Thank you for taking time to visit with me today. Please don't hesitate to contact me if I can be of assistance to you before our next scheduled telephone appointment. ? ?Following are the goals we discussed today:  ?Take all medications as prescribed ?Attend all scheduled provider appointments ?Call pharmacy for medication refills 3-7 days in advance of running out of medications ?Perform all self care activities independently  ?Perform IADL's (shopping, preparing meals, housekeeping, managing finances) independently ?Call provider office for new concerns or questions  ?check blood pressure weekly ?choose a place to take my blood pressure (home, clinic or office, retail store) ?write blood pressure results in a log or diary ?keep a blood pressure log ?take blood pressure log to all doctor appointments ?take medications for blood pressure exactly as prescribed ?eat more whole grains, fruits and vegetables, lean meats and healthy fats ?take all medications exactly as prescribed ?call doctor with any symptoms you believe are related to your medicine ?call doctor when you experience any new symptoms ?go to all doctor appointments as scheduled ?adhere to prescribed diet: heart healthy ?Follow low sodium, heart healthy diet- read labels, limit fast food ?Try to get outside daily ?Take all medications as prescribed ?Upcoming appointments- 10/07/21 Labwork and CT scan, 10/14/21 Dr. Delton Coombes ? ?Our next appointment is by telephone on 12/24/21 at 9 am ? ?Please call the care guide team at [phone] if you need to cancel or reschedule your appointment.  ? ?If you are experiencing a Mental Health or [place] or need someone to talk to, please call the Suicide and Crisis Lifeline: 988 ?call the [place] National Suicide Prevention Lifeline: 434-351-2162 or TTY: 774-644-8839 TTY (906)284-9681) to talk to a trained counselor ?call 1-800-273-TALK (toll free, 24 hour hotline) ?go to [facility] [address], [place] [phone]) ?call the [facility]: [phone] ?call 911  ? ?Patient verbalizes understanding of instructions and care plan provided today and agrees to view in [place]. Active MyChart status confirmed with patient.   ? ?Jacqlyn Larsen RNC, BSN ?RN Case Manager ?[place] ?[phone] ? ?Eating Plan for Osteoporosis ?Osteoporosis causes your bones to become weak and brittle. This puts you at greater risk for bone breaks (fractures) from small bumps or falls. Making changes to your diet and increasing your physical activity can help strengthen your bones and improve your overall health. ?Calcium and vitamin D are nutrients that play an important role in bone health. Vitamin D helps your body use calcium and strengthen bones. It is important to get enough calcium and vitamin D as part of your eating plan for osteoporosis. ?What are tips for following this plan? ?Reading food labels ?Try to get at least 1,000 milligrams (mg) of calcium each day. ?Look for foods that have at least 50 mg of calcium per serving. ?Talk with your health care provider about taking a calcium supplement if you do not get enough calcium from food. ?Do not have more than 2,500 mg of calcium each day. This is the upper limit for food and nutritional supplements combined. Too much calcium may cause constipation and prevent you from absorbing other important nutrients. ?Choose foods that contain vitamin D. ?Take a daily vitamin supplement that contains 800-1,000 international units (IU) of vitamin D. The amount may be different depending on your age, body weight, and where you live. Talk with your dietitian or health care provider about how much vitamin D is right for you. ?Avoid  foods that have more than 300 mg of sodium per serving. Too much sodium can cause your body to lose calcium. ?Talk with your dietitian or health care provider about how  much sodium you are allowed each day. ?Shopping ?Do not buy foods with added salt, including: ?Salted snacks. ?Angie Fava. ?Canned soups. ?Canned meats. ?Processed meats, such as bacon or precooked or cured meat like sausages or meat loaves. ?Smoked fish. ?Meal planning ?Eat balanced meals that contain protein foods, fruits and vegetables, and foods rich in calcium and vitamin D. ?Eat at least 5 servings of fruits and vegetables each day. ?Eat 5-6 oz (142-170 g) of lean meat, poultry, fish, eggs, or beans each day. ?Lifestyle ?Do not use any products that contain nicotine or tobacco, such as cigarettes, e-cigarettes, and chewing tobacco. If you need help quitting, ask your health care provider. ?If your health care provider recommends that you lose weight: ?Work with a dietitian to develop an eating plan that will help you reach your desired weight goal. ?Exercise for at least 30 minutes a day, 5 or more days a week, or as told by your health care provider. ?Work with a physical therapist to develop an exercise plan that includes flexibility, balance, and strength exercises. Do not focus only on aerobic exercise. ?Do not drink alcohol if: ?Your health care provider tells you not to drink. ?You are pregnant, may be pregnant, or are planning to become pregnant. ?If you drink alcohol: ?Limit how much you use to: ?0-1 drink a day for women. ?0-2 drinks a day for men. ?Be aware of how much alcohol is in your drink. In the [place] one drink equals one 12 oz bottle of beer (355 mL), one 5 oz glass of wine (148 mL), or one 1? oz glass of hard liquor (44 mL). ?What foods should I eat? ?Foods high in calcium ? ?Yogurt. Yogurt with fruit. ?Milk. Evaporated skim milk. Dry milk powder. ?Calcium-fortified orange juice. ?Parmesan cheese. Part-skim ricotta cheese. Natural hard cheese. Cream cheese. Cottage cheese. ?Canned sardines. Canned salmon. ?Calcium-treated tofu. Calcium-fortified cereal bar. Calcium-fortified cereal.  Calcium-fortified graham crackers. ?Cooked collard greens. Turnip greens. Broccoli. Kale. ?Almonds. ?White beans. ?Corn tortilla. ?Foods high in vitamin D ?Cod liver oil. Fatty fish, such as tuna, mackerel, and salmon. ?Milk. Fortified soy milk. Fortified fruit juice. ?Yogurt. Margarine. ?Egg yolks. ?Foods high in protein ?Beef. Lamb. Pork tenderloin. ?Chicken breast. ?Tuna (canned). Fish fillet. ?Tofu. ?Cooked soy beans. Soy patty. Beans (canned or cooked). ?Cottage cheese. ?Yogurt. ?Peanut butter. ?Pumpkin seeds. Nuts. Sunflower seeds. ?Hard cheese. ?Milk or other milk products, such as soy milk. ?The items listed above may not be a complete list of foods and beverages you can eat. Contact a dietitian for more options. ?Summary ?Calcium and vitamin D are nutrients that play an important role in bone health and are an important part of your eating plan for osteoporosis. ?Eat balanced meals that contain protein foods, fruits and vegetables, and foods rich in calcium and vitamin D. ?Avoid foods that have more than 300 mg of sodium per serving. Too much sodium can cause your body to lose calcium. ?Exercise is an important part of prevention and treatment of osteoporosis. Aim for at least 30 minutes a day, 5 days a week. ?This information is not intended to replace advice given to you by your health care provider. Make sure you discuss any questions you have with your health care provider. ?Document Revised: 10/27/2019 Document Reviewed: 10/27/2019 ?Elsevier Patient Education ? [place]. ? ?

## 2021-10-01 NOTE — Chronic Care Management (AMB) (Signed)
?Chronic Care Management  ? ?CCM RN Visit Note ? ?10/01/2021 ?Name: [name] MRN: [id number] DOB: [birth year] ? ?Subjective: ?[name] is a 71 y.o. year old female who is a primary care patient of Fayrene Helper, MD. The care management team was consulted for assistance with disease management and care coordination needs.   ? ?Engaged with patient by telephone for follow up visit in response to provider referral for case management and/or care coordination services.  ? ?Consent to Services:  ?The patient was given information about Chronic Care Management services, agreed to services, and gave verbal consent prior to initiation of services.  Please see initial visit note for detailed documentation.  ? ?Patient agreed to services and verbal consent obtained.  ? ?Assessment: Review of patient past medical history, allergies, medications, health status, including review of consultants reports, laboratory and other test data, was performed as part of comprehensive evaluation and provision of chronic care management services.  ? ?SDOH (Social Determinants of Health) assessments and interventions performed:   ? ?CCM Care Plan ? ?Allergies  ?Allergen Reactions  ? Septra [Sulfamethoxazole-Trimethoprim] Dermatitis  ? Barbiturates Other (See Comments)  ?  Prophyria   ? Chlordiazepoxide Other (See Comments)  ?  unknown  ? Hydrocodone-Acetaminophen Hives and Other (See Comments)  ? Pentothal [Thiopental]   ?  Hives?  ? Phenytoin Other (See Comments)  ?  Prophyria   ? Rofecoxib Other (See Comments)  ?  Prophyria   ? Cefuroxime Axetil Rash  ? Other   ? Wellbutrin [Bupropion] Palpitations  ? ? ?Outpatient Encounter Medications as of 10/01/2021  ?Medication Sig  ? acetaminophen (TYLENOL) 500 MG tablet Take 1,000 mg by mouth every 6 (six) hours as needed for moderate pain.  ? albuterol (VENTOLIN HFA) 108 (90 Base) MCG/ACT inhaler Inhale 2 puffs into the lungs every 6 (six) hours as needed for wheezing or shortness of  breath.  ? ALPRAZolam (XANAX) 0.25 MG tablet Take one tablet by mouth once daily, as needed, for anxiety  ? aspirin 81 MG EC tablet Take 1 tablet (81 mg total) by mouth daily.  ? Calcium Carbonate-Vitamin D 600-400 MG-UNIT tablet Take 1 tablet by mouth daily.  ? Cholecalciferol (VITAMIN D3) 5000 units TABS Take 5,000 Units by mouth daily.  ? Coenzyme Q10 100 MG capsule Take 100 mg by mouth 3 (three) times daily.  ? EPINEPHrine 0.3 mg/0.3 mL IJ SOAJ injection Inject 0.3 mg into the muscle as needed for anaphylaxis.  ? fluticasone (FLONASE) 50 MCG/ACT nasal spray Place 2 sprays into both nostrils daily. (Patient taking differently: Place 2 sprays into both nostrils daily as needed for allergies.)  ? gabapentin (NEURONTIN) 100 MG capsule Take 1 capsule (100 mg total) by mouth at bedtime.  ? hydroxychloroquine (PLAQUENIL) 200 MG tablet Take 400 mg by mouth daily.   ? loratadine (CLARITIN) 10 MG tablet Take 10 mg by mouth daily.  ? losartan (COZAAR) 50 MG tablet Take 1 tablet (50 mg total) by mouth daily.  ? montelukast (SINGULAIR) 10 MG tablet Take 1 tablet (10 mg total) by mouth at bedtime.  ? Multiple Vitamins-Minerals (CENTRUM SILVER PO) Take 1 tablet by mouth daily.  ? Omega-3 Fatty Acids (FISH OIL) 1000 MG CAPS Take 1 capsule (1,000 mg total) by mouth daily.  ? omeprazole (PRILOSEC) 20 MG capsule Take 20 mg by mouth at bedtime.  ? pravastatin (PRAVACHOL) 10 MG tablet TAKE 1 TABLET BY MOUTH ONCE DAILY STOP  TAKING  CRESTOR  ? Turmeric 500  MG CAPS Take 1 capsule by mouth.  ? vitamin B-12 (CYANOCOBALAMIN) 50 MCG tablet Take 50 mcg by mouth daily.  ? Zinc 50 MG TABS Take 50 mg by mouth daily.  ? alendronate (FOSAMAX) 70 MG tablet Take 1 tablet (70 mg total) by mouth every 7 (seven) days. Take with a full glass of water on an empty stomach. (Patient not taking: Reported on 09/04/2021)  ? ?No facility-administered encounter medications on file as of 10/01/2021.  ? ? ?Patient Active Problem List  ? Diagnosis Date Noted  ?  Bilateral shoulder pain 07/18/2020  ? Stiffness of right knee 07/18/2020  ? Insomnia due to anxiety and fear 07/03/2019  ? MALT lymphoma ([place]) 06/07/2019  ? Osteoporosis 04/28/2018  ? Abnormal CT scan, chest 06/10/2016  ? Rheumatoid arthritis ([place]) 05/13/2016  ? Annual physical exam 04/12/2015  ? GERD (gastroesophageal reflux disease) 03/24/2014  ? Obesity (BMI 30-39.9) 12/28/2011  ? Metabolic syndrome X 16/02/9603  ? Prediabetes 09/28/2011  ? Allergic rhinitis 09/04/2009  ? Hyperlipidemia LDL goal <100 08/12/2007  ? Essential hypertension 08/12/2007  ? ? ?Conditions to be addressed/monitored:HTN and HLD ? ?Care Plan : RN Care Manager plan of care  ?Updates made by Kassie Mends, RN since 10/01/2021 12:00 AM  ?  ? ?Problem: No plan of care established for management of chronic disease states (HTN, HLD, Lymphoma)   ?Priority: High  ?  ? ?Long-Range Goal: Develoment of Plan of Care for chronic disease management (HTN, HLD, Lymphoma)   ?Start Date: 04/23/2021  ?Expected End Date: 03/30/2022  ?Priority: High  ?Note:   ?Current Barriers:  ?Knowledge Deficits related to plan of care for management of HTN and HLD  ?Patient reports she lives alone and has sisters nearby who can assist her if needed, pt still drives, is independent with all aspects of her care, has cane to use if needed, pt reports she has arthritis and this hinders her ability to exercise, she walks in her house sometimes and also stretches.  Patient checks blood pressure 2-3 times per month, reports readings have been within normal limits with blood pressure today 117/77, has advanced directives in place. Patient reports she had DEXA scan and followed up with Dr. Dorris Fetch and has diagnosis osteoporosis, has quit taking fosamax because IV Reclast completed on 09/17/21 and this is a yearly infusion. ?Poorly controlled hyperlipidemia, complicated by diet, lack of consistent exercise (unable due to rheumatoid arthritis) ?Current antihyperlipidemic regimen:  pravastatin ?Continues following up with oncology- Dr. Delton Coombes- for lymphoma, to have repeat CT scan in May 2023. ? ?RNCM Clinical Goal(s):  ?Patient will verbalize understanding of plan for management of HTN and HLD as evidenced by patient report, review of EHR and  through collaboration with RN Care manager, provider, and care team.  ? ?Interventions: ?1:1 collaboration with primary care provider regarding development and update of comprehensive plan of care as evidenced by provider attestation and co-signature ?Inter-disciplinary care team collaboration (see longitudinal plan of care) ?Evaluation of current treatment plan related to  self management and patient's adherence to plan as established by provider ? ?Hyperlipidemia Interventions:  (Status:  Goal on track:  Yes.) Long Term Goal ?Medication review performed; medication list updated in electronic medical record.  ?Counseled on importance of regular laboratory monitoring as prescribed ?Reviewed role and benefits of statin for ASCVD risk reduction ?Reviewed importance of limiting foods high in cholesterol ?Pain assessment completed ?Reviewed heart healthy diet, reading labels ? ?Hypertension Interventions:  (Status:  Goal on track:  Yes.)  Long Term Goal ?Last practice recorded BP readings:  ?BP Readings from Last 3 Encounters:  ?04/08/21 (!) 142/88  ?03/22/21 134/80  ?01/11/21 138/84  ?Most recent eGFR/CrCl: No results found for: EGFR  No components found for: CRCL ? ?Evaluation of current treatment plan related to hypertension self management and patient's adherence to plan as established by provider ?Advised patient, providing education and rationale, to monitor blood pressure daily and record, calling PCP for findings outside established parameters ?Reviewed low sodium diet, limiting fast food ?Reviewed upcoming scheduled appointments 10/07/21 Labwork and CT scan, 10/14/21 Dr. Delton Coombes ?Reviewed blood pressure log with patient ? ?Patient  Goals/Self-Care Activities: ?Take all medications as prescribed ?Attend all scheduled provider appointments ?Call pharmacy for medication refills 3-7 days in advance of running out of medications ?Perform all self care activitie

## 2021-10-07 ENCOUNTER — Ambulatory Visit (HOSPITAL_COMMUNITY)
Admission: RE | Admit: 2021-10-07 | Discharge: 2021-10-07 | Disposition: A | Discharge status: Home / Self Care | Payer: Medicare Other | Source: Ambulatory Visit | Attending: Hematology | Admitting: Hematology

## 2021-10-07 ENCOUNTER — Inpatient Hospital Stay (HOSPITAL_COMMUNITY): Payer: Medicare Other | Attending: Hematology

## 2021-10-07 DIAGNOSIS — C8590 Non-Hodgkin lymphoma, unspecified, unspecified site: Secondary | ICD-10-CM | POA: Diagnosis not present

## 2021-10-07 DIAGNOSIS — R918 Other nonspecific abnormal finding of lung field: Secondary | ICD-10-CM | POA: Diagnosis not present

## 2021-10-07 DIAGNOSIS — R911 Solitary pulmonary nodule: Secondary | ICD-10-CM | POA: Diagnosis not present

## 2021-10-07 LAB — CBC WITH DIFFERENTIAL/PLATELET
Abs Immature Granulocytes: 0.02 10*3/uL (ref 0.00–0.07)
Basophils Absolute: 0 10*3/uL (ref 0.0–0.1)
Basophils Relative: 0 %
Eosinophils Absolute: 0.3 10*3/uL (ref 0.0–0.5)
Eosinophils Relative: 3 %
HCT: 38.7 % (ref 36.0–46.0)
Hemoglobin: 12.4 g/dL (ref 12.0–15.0)
Immature Granulocytes: 0 %
Lymphocytes Relative: 21 %
Lymphs Abs: 1.8 10*3/uL (ref 0.7–4.0)
MCH: 27.4 pg (ref 26.0–34.0)
MCHC: 32 g/dL (ref 30.0–36.0)
MCV: 85.6 fL (ref 80.0–100.0)
Monocytes Absolute: 0.7 10*3/uL (ref 0.1–1.0)
Monocytes Relative: 8 %
Neutro Abs: 5.9 10*3/uL (ref 1.7–7.7)
Neutrophils Relative %: 68 %
Platelets: 269 10*3/uL (ref 150–400)
RBC: 4.52 MIL/uL (ref 3.87–5.11)
RDW: 15.2 % (ref 11.5–15.5)
WBC: 8.6 10*3/uL (ref 4.0–10.5)
nRBC: 0 % (ref 0.0–0.2)

## 2021-10-07 LAB — COMPREHENSIVE METABOLIC PANEL
ALT: 16 U/L (ref 0–44)
AST: 19 U/L (ref 15–41)
Albumin: 3.6 g/dL (ref 3.5–5.0)
Alkaline Phosphatase: 71 U/L (ref 38–126)
Anion gap: 3 — ABNORMAL LOW (ref 5–15)
BUN: 14 mg/dL (ref 8–23)
CO2: 26 mmol/L (ref 22–32)
Calcium: 8.5 mg/dL — ABNORMAL LOW (ref 8.9–10.3)
Chloride: 108 mmol/L (ref 98–111)
Creatinine, Ser: 0.82 mg/dL (ref 0.44–1.00)
GFR, Estimated: >60 mL/min (ref >60)
Glucose, Bld: 95 mg/dL (ref 70–99)
Potassium: 4.1 mmol/L (ref 3.5–5.1)
Sodium: 137 mmol/L (ref 135–145)
Total Bilirubin: 0.3 mg/dL (ref 0.3–1.2)
Total Protein: 7.2 g/dL (ref 6.5–8.1)

## 2021-10-07 LAB — LACTATE DEHYDROGENASE: LDH: 141 U/L (ref 98–192)

## 2021-10-14 ENCOUNTER — Inpatient Hospital Stay (HOSPITAL_BASED_OUTPATIENT_CLINIC_OR_DEPARTMENT_OTHER): Payer: Medicare Other | Admitting: Hematology

## 2021-10-14 VITALS — BP 145/89 | HR 97 | Temp 97.9°F | Resp 18 | Ht 67.0 in | Wt 202.9 lb

## 2021-10-14 DIAGNOSIS — Z1231 Encounter for screening mammogram for malignant neoplasm of breast: Secondary | ICD-10-CM

## 2021-10-14 DIAGNOSIS — I10 Essential (primary) hypertension: Secondary | ICD-10-CM | POA: Diagnosis not present

## 2021-10-14 DIAGNOSIS — C8590 Non-Hodgkin lymphoma, unspecified, unspecified site: Secondary | ICD-10-CM

## 2021-10-14 DIAGNOSIS — F17210 Nicotine dependence, cigarettes, uncomplicated: Secondary | ICD-10-CM | POA: Insufficient documentation

## 2021-10-14 DIAGNOSIS — Z79899 Other long term (current) drug therapy: Secondary | ICD-10-CM | POA: Insufficient documentation

## 2021-10-14 DIAGNOSIS — M069 Rheumatoid arthritis, unspecified: Secondary | ICD-10-CM | POA: Insufficient documentation

## 2021-10-14 DIAGNOSIS — C884 Extranodal marginal zone B-cell lymphoma of mucosa-associated lymphoid tissue [MALT-lymphoma]: Secondary | ICD-10-CM | POA: Insufficient documentation

## 2021-10-14 NOTE — Progress Notes (Signed)
[place], [place] [address]   CLINIC:  Medical Oncology/Hematology  PCP:  Fayrene Helper, MD [address] [place] / [place] [address] [phone]   REASON FOR VISIT:  Follow-up for MALT lymphoma  PRIOR THERAPY: none  CURRENT THERAPY: surveillance  INTERVAL HISTORY:  [name], a 71 y.o. female, returns for routine follow-up of her MALT lymphoma. [name] was last seen on 04/08/2021.   Today she reports feeling good. She denies fevers, night sweats, and weight loss. She received Reclast last month and reports severe bone pains for 4 days following; she otherwise denies new pains.  REVIEW OF SYSTEMS:  Review of Systems  Constitutional:  Negative for appetite change, fatigue, fever and unexpected weight change.  Endocrine: Negative for hot flashes.  Neurological:  Positive for numbness.  Psychiatric/Behavioral:  The patient is nervous/anxious.   All other systems reviewed and are negative.  PAST MEDICAL/SURGICAL HISTORY:  Past Medical History:  Diagnosis Date   Acute left-sided back pain with sciatica 11/27/2018   Annual physical exam 04/12/2015   Arthritis    Phreesia 05/01/2020   Arthritis, rheumatoid ([place]) 1993   hx   Cancer ([place])    Phreesia 05/01/2020   Collagen vascular disease (HCC)    RA   COPD (chronic obstructive pulmonary disease) (HCC)    Hyperlipidemia    Hypertension    Kidney stone 1999   Non Hodgkin's lymphoma ([place])    Obesity    Osteoporosis 2019   intolerant of fosamax due to jaw pain   Porphyria ([place])    hx    Past Surgical History:  Procedure Laterality Date   APPENDECTOMY  1983   BREAST BIOPSY Left    diagnosid with non hogdkins lymphoma   COLONOSCOPY WITH PROPOFOL N/A 05/30/2020   Procedure: COLONOSCOPY WITH PROPOFOL;  Surgeon: Rogene Houston, MD;  [facility];  Service: Endoscopy;  Laterality: N/A;  10:30   LITHOTRIPSY  2000   rt renal stone    POLYPECTOMY   05/30/2020   Procedure: POLYPECTOMY;  Surgeon: Rogene Houston, MD;  [facility];  Service: Endoscopy;;    SOCIAL HISTORY:  Social History   Socioeconomic History   Marital status: Single    Spouse name: Not on file   Number of children: 0   Years of education: 12+   Highest education level: Doctorate  Occupational History   Occupation: Retired; Teacher part time currently  Tobacco Use   Smoking status: Some Days    Packs/day: 0.50    Types: Cigarettes    Last attempt to quit: 05/25/2020    Years since quitting: 1.3   Smokeless tobacco: Never   Tobacco comments:    only has one occasionally  Vaping Use   Vaping Use: Never used  Substance and Sexual Activity   Alcohol use: No   Drug use: No   Sexual activity: Not Currently  Other Topics Concern   Not on file  Social History Narrative   Lives alone    Social Determinants of Health   Financial Resource Strain: Low Risk    Difficulty of Paying Living Expenses: Not hard at all  Food Insecurity: No Food Insecurity   Worried About Charity fundraiser in the Last Year: Never true   Ran Out of Food in the Last Year: Never true  Transportation Needs: No Transportation Needs   Lack of Transportation (Medical): No   Lack of  Transportation (Non-Medical): No  Physical Activity: Insufficiently Active   Days of Exercise per Week: 1 day   Minutes of Exercise per Session: 10 min  Stress: No Stress Concern Present   Feeling of Stress : Only a little  Social Connections: Unknown   Frequency of Communication with Friends and Family: More than three times a week   Frequency of Social Gatherings with Friends and Family: More than three times a week   Attends Religious Services: Not on Electrical engineer or Organizations: Yes   Attends Music therapist: More than 4 times per year   Marital Status: Never married  Human resources officer Violence: Not on file    FAMILY HISTORY:  Family History  Problem  Relation Age of Onset   Arthritis Mother    Hypertension Mother    Hypertension Sister    Arthritis Sister    Hypertension Sister    Arthritis Sister    Ulcerative colitis Sister    Hypertension Sister    Arthritis Sister    Stroke Maternal Grandmother    Arthritis Maternal Grandfather    Arthritis Paternal Grandmother    Stroke Paternal Grandfather     CURRENT MEDICATIONS:  Current Outpatient Medications  Medication Sig Dispense Refill   acetaminophen (TYLENOL) 500 MG tablet Take 1,000 mg by mouth every 6 (six) hours as needed for moderate pain.     albuterol (VENTOLIN HFA) 108 (90 Base) MCG/ACT inhaler Inhale 2 puffs into the lungs every 6 (six) hours as needed for wheezing or shortness of breath. 1 each 3   ALPRAZolam (XANAX) 0.25 MG tablet Take one tablet by mouth once daily, as needed, for anxiety 10 tablet 0   aspirin 81 MG EC tablet Take 1 tablet (81 mg total) by mouth daily. 30 tablet 12   Calcium Carbonate-Vitamin D 600-400 MG-UNIT tablet Take 1 tablet by mouth daily.     Cholecalciferol (VITAMIN D3) 5000 units TABS Take 5,000 Units by mouth daily.     Coenzyme Q10 100 MG capsule Take 100 mg by mouth 3 (three) times daily.     EPINEPHrine 0.3 mg/0.3 mL IJ SOAJ injection Inject 0.3 mg into the muscle as needed for anaphylaxis. 1 each 2   fluticasone (FLONASE) 50 MCG/ACT nasal spray Place 2 sprays into both nostrils daily. (Patient taking differently: Place 2 sprays into both nostrils daily as needed for allergies.) 48 g 1   gabapentin (NEURONTIN) 100 MG capsule Take 1 capsule (100 mg total) by mouth at bedtime. 90 capsule 3   hydroxychloroquine (PLAQUENIL) 200 MG tablet Take 400 mg by mouth daily.      loratadine (CLARITIN) 10 MG tablet Take 10 mg by mouth daily.     losartan (COZAAR) 50 MG tablet Take 1 tablet (50 mg total) by mouth daily. 90 tablet 3   montelukast (SINGULAIR) 10 MG tablet Take 1 tablet (10 mg total) by mouth at bedtime. 90 tablet 3   Multiple  Vitamins-Minerals (CENTRUM SILVER PO) Take 1 tablet by mouth daily.     Omega-3 Fatty Acids (FISH OIL) 1000 MG CAPS Take 1 capsule (1,000 mg total) by mouth daily.  0   omeprazole (PRILOSEC) 20 MG capsule Take 20 mg by mouth at bedtime.     pravastatin (PRAVACHOL) 10 MG tablet TAKE 1 TABLET BY MOUTH ONCE DAILY STOP  TAKING  CRESTOR 90 tablet 0   Turmeric 500 MG CAPS Take 1 capsule by mouth.     vitamin B-12 (CYANOCOBALAMIN) 50  MCG tablet Take 50 mcg by mouth daily.     Zinc 50 MG TABS Take 50 mg by mouth daily.     buPROPion (WELLBUTRIN SR) 150 MG 12 hr tablet Take by mouth.     cyclopentolate (CYCLODRYL,CYCLOGYL) 1 % ophthalmic solution      hydrOXYzine (ATARAX) 50 MG tablet 1 tablet as needed     No current facility-administered medications for this visit.    ALLERGIES:  Allergies  Allergen Reactions   Septra [Sulfamethoxazole-Trimethoprim] Dermatitis   Barbiturates Other (See Comments)    Prophyria    Chlordiazepoxide Other (See Comments)    unknown   Hydrocodone-Acetaminophen Hives and Other (See Comments)   Pentothal [Thiopental]     Hives?   Phenytoin Other (See Comments)    Prophyria    Rofecoxib Other (See Comments)    Prophyria    Cefuroxime Axetil Rash   Other    Wellbutrin [Bupropion] Palpitations    PHYSICAL EXAM:  Performance status (ECOG): 1 - Symptomatic but completely ambulatory  Vitals:   10/14/21 1140  BP: (!) 145/89  Pulse: 97  Resp: 18  Temp: 97.9 F (36.6 C)  SpO2: 97%   Wt Readings from Last 3 Encounters:  10/14/21 202 lb 14.4 oz (92 kg)  09/17/21 207 lb 6.4 oz (94.1 kg)  09/04/21 204 lb (92.5 kg)   Physical Exam Vitals reviewed.  Constitutional:      Appearance: Normal appearance.  Cardiovascular:     Rate and Rhythm: Normal rate and regular rhythm.     Pulses: Normal pulses.     Heart sounds: Normal heart sounds.  Pulmonary:     Effort: Pulmonary effort is normal.     Breath sounds: Normal breath sounds.  Chest:  Breasts:     Right: No swelling, bleeding, inverted nipple, mass, nipple discharge, skin change or tenderness.     Left: No swelling, bleeding, inverted nipple, mass, nipple discharge, skin change or tenderness.  Abdominal:     Palpations: Abdomen is soft. There is no hepatomegaly, splenomegaly or mass.     Tenderness: There is no abdominal tenderness.  Musculoskeletal:     Right lower leg: No edema.     Left lower leg: No edema.  Lymphadenopathy:     Upper Body:     Right upper body: No supraclavicular, axillary or pectoral adenopathy.     Left upper body: No supraclavicular, axillary or pectoral adenopathy.     Lower Body: No right inguinal adenopathy. No left inguinal adenopathy.  Neurological:     General: No focal deficit present.     Mental Status: She is alert and oriented to person, place, and time.  Psychiatric:        Mood and Affect: Mood normal.        Behavior: Behavior normal.     LABORATORY DATA:  I have reviewed the labs as listed.     Latest Ref Rng & Units 10/07/2021   10:02 AM 04/04/2021    8:41 AM 09/25/2020    8:56 AM  CBC  WBC 4.0 - 10.5 K/uL 8.6   8.0   7.2    Hemoglobin 12.0 - 15.0 g/dL 12.4   13.3   13.1    Hematocrit 36.0 - 46.0 % 38.7   40.7   40.9    Platelets 150 - 400 K/uL 269   234   264        Latest Ref Rng & Units 10/07/2021   10:02 AM 04/04/2021  8:41 AM 09/25/2020    8:56 AM  CMP  Glucose 70 - 99 mg/dL 95   99   97    BUN 8 - 23 mg/dL '14   11   14    '$ Creatinine 0.44 - 1.00 mg/dL 0.82   0.88   0.90    Sodium 135 - 145 mmol/L 137   139   138    Potassium 3.5 - 5.1 mmol/L 4.1   4.2   4.3    Chloride 98 - 111 mmol/L 108   105   105    CO2 22 - 32 mmol/L '26   26   27    '$ Calcium 8.9 - 10.3 mg/dL 8.5   9.0   8.7    Total Protein 6.5 - 8.1 g/dL 7.2   7.2   7.1    Total Bilirubin 0.3 - 1.2 mg/dL 0.3   0.6   0.5    Alkaline Phos 38 - 126 U/L 71   68   63    AST 15 - 41 U/L '19   20   20    '$ ALT 0 - 44 U/L '16   18   18      '$ DIAGNOSTIC IMAGING:  I have  independently reviewed the scans and discussed with the patient. CT Chest Wo Contrast  Result Date: 10/08/2021 CLINICAL DATA:  Hematologic malignancy, monitor (MALT lymphoma). Follow-up pulmonary nodule. * Tracking Code: BO * EXAM: CT CHEST WITHOUT CONTRAST TECHNIQUE: Multidetector CT imaging of the chest was performed following the standard protocol without IV contrast. RADIATION DOSE REDUCTION: This exam was performed according to the departmental dose-optimization program which includes automated exposure control, adjustment of the mA and/or kV according to patient size and/or use of iterative reconstruction technique. COMPARISON:  Chest CT 08/20/2017.  PET-CT 04/03/2021. FINDINGS: Cardiovascular: Diffuse atherosclerosis of the aorta, great vessels and coronary arteries. No acute vascular findings on noncontrast CT. The heart size is normal. There is no pericardial effusion. Mediastinum/Nodes: There are no enlarged mediastinal, hilar or axillary lymph nodes.Small mediastinal lymph nodes appear unchanged. Hilar assessment is limited by the lack of intravenous contrast, although the hilar contours appear unchanged. The thyroid gland, trachea and esophagus demonstrate no significant findings. Lungs/Pleura: No pleural effusion or pneumothorax. Mild centrilobular emphysema with scattered mild centrilobular emphysema. Scattered tiny pulmonary nodules bilaterally are unchanged from 2019 CT, consistent with benign findings. These include a 4 mm left upper lobe nodule image 69/4 which may be perifissural adjacent to an accessory fissure. No suspicious nodules. Upper abdomen: The visualized upper abdomen appears stable without acute findings. There is scarring in the right kidney. Musculoskeletal/Chest wall: There is no chest wall mass or suspicious osseous finding. IMPRESSION: 1. Scattered small pulmonary nodules are unchanged from 2019, consistent with benign findings. Fleischner criteria do not apply given the  personal history of malignancy, although no specific follow-up is necessary. 2. No adenopathy or acute findings. 3. Coronary and aortic atherosclerosis (ICD10-I70.0). Emphysema (ICD10-J43.9). Electronically Signed   By: Richardean Sale M.D.   On: 10/08/2021 15:11     ASSESSMENT:  1.  Clinical stage III MALT lymphoma: -Mammogram on May 12, 2019 showed left breast mass. -Biopsy consistent with non-Hodgkin's lymphoma, specifically extranodal marginal zone lymphoma of MALT with plasmacytic differentiation. -She did not have any B symptoms. PET scan on 06/17/2019 showed mild hypermetabolic area in the left breast at the site of known lymphoma. Intense FDG uptake within the retrobulbar and periorbital soft tissues without signs of  visible soft tissue mass or lesion. Mildly hypermetabolic lymph nodes in the external iliac chains bilaterally. Asymmetry of the tonsillar tissue with increased activity on the left side. MRI of the orbits did not show any mass. -She completed radiation therapy to the left breast mass end of March 2021. -PET CT scan on 03/06/2020 showed improvement at site of prior biopsy in the left upper breast, SUV 1.2, previously 4.4.  Stable mildly prominent bilateral external iliac lymph nodes with no new adenopathy.     2. Rheumatoid arthritis: -She was diagnosed in her 31s. Currently on Plaquenil.   PLAN:  1.  Clinical stage III MALT lymphoma: - She denies any B symptoms. - Breast exam today did not reveal any palpable masses in bilateral breast.  No palpable adenopathy. - Labs from 10/07/2021 shows normal LDH, LFTs and CBC. - Reviewed CT chest without contrast from 10/07/2021 which showed scattered small lung nodules unchanged since 2019.  No indication to repeat scans. - Mammogram on 05/17/2021 was BI-RADS Category 1.  Continue yearly mammograms. - RTC 1 year for follow-up With repeat labs and exam.   2. Rheumatoid arthritis: - Continue Plaquenil.   Orders placed this  encounter:  No orders of the defined types were placed in this encounter.    Derek Jack, MD [place] [phone]   I, Thana Ates, am acting as a scribe for Dr. Derek Jack.  I, Derek Jack MD, have reviewed the above documentation for accuracy and completeness, and I agree with the above.

## 2021-10-14 NOTE — Patient Instructions (Addendum)
[place] at [facility] Discharge Instructions  You were seen and examined today by Dr. Delton Coombes.  Dr. Delton Coombes discussed your most recent lab work which is stable. Proceed with your mammogram in December.  Your CT scan is clear! No follow-up scans needed.  Follow-up as scheduled.    Thank you for choosing [place] at [facility] to provide your oncology and hematology care.  To afford each patient quality time with our provider, please arrive at least 15 minutes before your scheduled appointment time.   If you have a lab appointment with the [facility] please come in thru the Main Entrance and check in at the main information desk.  You need to re-schedule your appointment should you arrive 10 or more minutes late.  We strive to give you quality time with our providers, and arriving late affects you and other patients whose appointments are after yours.  Also, if you no show three or more times for appointments you may be dismissed from the clinic at the providers discretion.     Again, thank you for choosing [facility].  Our hope is that these requests will decrease the amount of time that you wait before being seen by our physicians.       _____________________________________________________________  Should you have questions after your visit to [facility], please contact our office at [phone] and follow the prompts.  Our office hours are 8:00 a.m. and 4:30 p.m. Monday - Friday.  Please note that voicemails left after 4:00 p.m. may not be returned until the following business day.  We are closed weekends and major holidays.  You do have access to a nurse 24-7, just call the main number to the clinic [phone] and do not press any options, hold on the line and a nurse will answer the phone.    For prescription refill requests, have your pharmacy contact our office and allow 72 hours.    Due to  Covid, you will need to wear a mask upon entering the hospital. If you do not have a mask, a mask will be given to you at the Main Entrance upon arrival. For doctor visits, patients may have 1 support person age 27 or older with them. For treatment visits, patients can not have anyone with them due to social distancing guidelines and our immunocompromised population.

## 2021-10-23 DIAGNOSIS — I10 Essential (primary) hypertension: Secondary | ICD-10-CM | POA: Diagnosis not present

## 2021-10-23 DIAGNOSIS — E785 Hyperlipidemia, unspecified: Secondary | ICD-10-CM | POA: Diagnosis not present

## 2021-10-23 DIAGNOSIS — F17210 Nicotine dependence, cigarettes, uncomplicated: Secondary | ICD-10-CM | POA: Diagnosis not present

## 2021-11-12 DIAGNOSIS — M810 Age-related osteoporosis without current pathological fracture: Secondary | ICD-10-CM | POA: Diagnosis not present

## 2021-11-12 DIAGNOSIS — M1991 Primary osteoarthritis, unspecified site: Secondary | ICD-10-CM | POA: Diagnosis not present

## 2021-11-12 DIAGNOSIS — Z79899 Other long term (current) drug therapy: Secondary | ICD-10-CM | POA: Diagnosis not present

## 2021-11-12 DIAGNOSIS — M0579 Rheumatoid arthritis with rheumatoid factor of multiple sites without organ or systems involvement: Secondary | ICD-10-CM | POA: Diagnosis not present

## 2021-11-12 DIAGNOSIS — H209 Unspecified iridocyclitis: Secondary | ICD-10-CM | POA: Diagnosis not present

## 2021-11-21 ENCOUNTER — Other Ambulatory Visit: Payer: Self-pay | Admitting: Family Medicine

## 2021-11-21 DIAGNOSIS — I10 Essential (primary) hypertension: Secondary | ICD-10-CM

## 2021-12-10 ENCOUNTER — Encounter: Payer: Self-pay | Admitting: Internal Medicine

## 2021-12-10 ENCOUNTER — Ambulatory Visit (INDEPENDENT_AMBULATORY_CARE_PROVIDER_SITE_OTHER): Payer: Medicare Other | Admitting: Internal Medicine

## 2021-12-10 ENCOUNTER — Encounter: Payer: Self-pay | Admitting: Family Medicine

## 2021-12-10 DIAGNOSIS — M1991 Primary osteoarthritis, unspecified site: Secondary | ICD-10-CM | POA: Insufficient documentation

## 2021-12-10 DIAGNOSIS — U071 COVID-19: Secondary | ICD-10-CM

## 2021-12-10 MED ORDER — BENZONATATE 100 MG PO CAPS: 100.0000 mg | ORAL_CAPSULE | Freq: Two times a day (BID) | ORAL | 0 refills | Status: DC | PRN

## 2021-12-10 MED ORDER — NIRMATRELVIR/RITONAVIR (PAXLOVID)TABLET: 3.0000 | ORAL_TABLET | Freq: Two times a day (BID) | ORAL | 0 refills | Status: AC

## 2021-12-10 NOTE — Progress Notes (Signed)
Virtual Visit via Telephone Note   This visit type was conducted due to national recommendations for restrictions regarding the COVID-19 Pandemic (e.g. social distancing) in an effort to limit this patient's exposure and mitigate transmission in our community.  Due to her co-morbid illnesses, this patient is at least at moderate risk for complications without adequate follow up.  This format is felt to be most appropriate for this patient at this time.  The patient did not have access to video technology/had technical difficulties with video requiring transitioning to audio format only (telephone).  All issues noted in this document were discussed and addressed.  No physical exam could be performed with this format.  Evaluation Performed:  Follow-up visit  Date:  12/10/2021   ID:  [name], DOB [birth year], MRN [id number]  Patient Location: Home Provider Location: Office/Clinic  Participants: Patient Location of Patient: Home Location of Provider: Telehealth Consent was obtain for visit to be over via telehealth. I verified that I am speaking with the correct person using two identifiers.  PCP:  Fayrene Helper, MD   Chief Complaint: Nasal congestion  History of Present Illness:    [name] is a 71 y.o. female who has a televisit for c/o nasal congestion and chills for the last 4 days.  She attended a nursing conference recently, after which many attendees tested positive for [place].  She tested positive for COVID this morning.  She denies any fever, dyspnea or wheezing currently.  The patient does not have symptoms concerning for COVID-19 infection (fever, chills, cough, or new shortness of breath).   Past Medical, Surgical, Social History, Allergies, and Medications have been Reviewed.  Past Medical History:  Diagnosis Date   Acute left-sided back pain with sciatica 11/27/2018   Annual physical exam 04/12/2015   Arthritis    Phreesia 05/01/2020   Arthritis,  rheumatoid ([place]) 1993   hx   Cancer ([place])    Phreesia 05/01/2020   Collagen vascular disease (HCC)    RA   COPD (chronic obstructive pulmonary disease) (HCC)    Hyperlipidemia    Hypertension    Kidney stone 1999   Non Hodgkin's lymphoma ([place])    Obesity    Osteoporosis 2019   intolerant of fosamax due to jaw pain   Porphyria ([place])    hx    Past Surgical History:  Procedure Laterality Date   APPENDECTOMY  1983   BREAST BIOPSY Left    diagnosid with non hogdkins lymphoma   COLONOSCOPY WITH PROPOFOL N/A 05/30/2020   Procedure: COLONOSCOPY WITH PROPOFOL;  Surgeon: Rogene Houston, MD;  [facility];  Service: Endoscopy;  Laterality: N/A;  10:30   LITHOTRIPSY  2000   rt renal stone    POLYPECTOMY  05/30/2020   Procedure: POLYPECTOMY;  Surgeon: Rogene Houston, MD;  [facility];  Service: Endoscopy;;     Current Meds  Medication Sig   acetaminophen (TYLENOL) 500 MG tablet Take 1,000 mg by mouth every 6 (six) hours as needed for moderate pain.   albuterol (VENTOLIN HFA) 108 (90 Base) MCG/ACT inhaler Inhale 2 puffs into the lungs every 6 (six) hours as needed for wheezing or shortness of breath.   ALPRAZolam (XANAX) 0.25 MG tablet Take one tablet by mouth once daily, as needed, for anxiety   aspirin 81 MG EC tablet Take 1 tablet (81 mg total) by mouth daily.   buPROPion (WELLBUTRIN SR) 150 MG 12 hr tablet Take by  mouth.   Calcium Carbonate-Vitamin D 600-400 MG-UNIT tablet Take 1 tablet by mouth daily.   Cholecalciferol (VITAMIN D3) 5000 units TABS Take 5,000 Units by mouth daily.   Coenzyme Q10 100 MG capsule Take 100 mg by mouth 3 (three) times daily.   cyclopentolate (CYCLODRYL,CYCLOGYL) 1 % ophthalmic solution    EPINEPHrine 0.3 mg/0.3 mL IJ SOAJ injection Inject 0.3 mg into the muscle as needed for anaphylaxis.   fluticasone (FLONASE) 50 MCG/ACT nasal spray Place 2 sprays into both nostrils daily. (Patient taking differently: Place 2 sprays into both  nostrils daily as needed for allergies.)   gabapentin (NEURONTIN) 100 MG capsule Take 1 capsule (100 mg total) by mouth at bedtime.   hydroxychloroquine (PLAQUENIL) 200 MG tablet Take 400 mg by mouth daily.    hydrOXYzine (ATARAX) 50 MG tablet 1 tablet as needed   loratadine (CLARITIN) 10 MG tablet Take 10 mg by mouth daily.   losartan (COZAAR) 50 MG tablet Take 1 tablet (50 mg total) by mouth daily.   montelukast (SINGULAIR) 10 MG tablet Take 1 tablet (10 mg total) by mouth at bedtime.   Multiple Vitamins-Minerals (CENTRUM SILVER PO) Take 1 tablet by mouth daily.   Omega-3 Fatty Acids (FISH OIL) 1000 MG CAPS Take 1 capsule (1,000 mg total) by mouth daily.   omeprazole (PRILOSEC) 20 MG capsule Take 20 mg by mouth at bedtime.   pravastatin (PRAVACHOL) 10 MG tablet TAKE 1 TABLET BY MOUTH ONCE DAILY STOP  TAKING  CRESTOR   Turmeric 500 MG CAPS Take 1 capsule by mouth.   vitamin B-12 (CYANOCOBALAMIN) 50 MCG tablet Take 50 mcg by mouth daily.   Zinc 50 MG TABS Take 50 mg by mouth daily.     Allergies:   Septra [sulfamethoxazole-trimethoprim], Barbiturates, Chlordiazepoxide, Hydrocodone-acetaminophen, Pentothal [thiopental], Phenytoin, Rofecoxib, Cefuroxime axetil, Other, and Wellbutrin [bupropion]   ROS:   Please see the history of present illness.     All other systems reviewed and are negative.   Labs/Other Tests and Data Reviewed:    Recent Labs: 03/20/2021: TSH 1.020 10/07/2021: ALT 16; BUN 14; Creatinine, Ser 0.82; Hemoglobin 12.4; Platelets 269; Potassium 4.1; Sodium 137   Recent Lipid Panel Lab Results  Component Value Date/Time   CHOL 186 03/20/2021 08:35 AM   TRIG 99 03/20/2021 08:35 AM   HDL 54 03/20/2021 08:35 AM   CHOLHDL 3.4 03/20/2021 08:35 AM   CHOLHDL 3.1 03/27/2020 02:14 PM   LDLCALC 114 (H) 03/20/2021 08:35 AM   LDLCALC 122 (H) 11/17/2018 07:43 AM    Wt Readings from Last 3 Encounters:  10/14/21 202 lb 14.4 oz (92 kg)  09/17/21 207 lb 6.4 oz (94.1 kg)   09/04/21 204 lb (92.5 kg)     ASSESSMENT & PLAN:    COVID-19 infection Started Paxlovid Tessalon as needed for cough Robitussin as needed for nasal congestion and cough    Time:   Today, I have spent 9 minutes reviewing the chart, including problem list, medications, and with the patient with telehealth technology discussing the above problems.   Medication Adjustments/Labs and Tests Ordered: Current medicines are reviewed at length with the patient today.  Concerns regarding medicines are outlined above.   Tests Ordered: No orders of the defined types were placed in this encounter.   Medication Changes: No orders of the defined types were placed in this encounter.    Note: This dictation was prepared with Dragon dictation along with smaller phrase technology. Similar sounding words can be transcribed inadequately or may not  be corrected upon review. Any transcriptional errors that result from this process are unintentional.      Disposition:  Follow up  Signed, Lindell Spar, MD  12/10/2021 9:01 AM     [facility]

## 2021-12-10 NOTE — Telephone Encounter (Signed)
Patient made a virtual appt 12-10-21

## 2021-12-24 ENCOUNTER — Ambulatory Visit (INDEPENDENT_AMBULATORY_CARE_PROVIDER_SITE_OTHER): Payer: Medicare Other | Admitting: *Deleted

## 2021-12-24 DIAGNOSIS — I10 Essential (primary) hypertension: Secondary | ICD-10-CM

## 2021-12-24 DIAGNOSIS — E785 Hyperlipidemia, unspecified: Secondary | ICD-10-CM

## 2021-12-24 NOTE — Patient Instructions (Signed)
Visit Information  Thank you for taking time to visit with me today. Please don't hesitate to contact me if I can be of assistance to you before our next scheduled telephone appointment.  Following are the goals we discussed today:  Take all medications as prescribed Attend all scheduled provider appointments Call pharmacy for medication refills 3-7 days in advance of running out of medications Perform all self care activities independently  Perform IADL's (shopping, preparing meals, housekeeping, managing finances) independently Call provider office for new concerns or questions  check blood pressure weekly choose a place to take my blood pressure (home, clinic or office, retail store) write blood pressure results in a log or diary keep a blood pressure log take blood pressure log to all doctor appointments keep all doctor appointments take medications for blood pressure exactly as prescribed eat more whole grains, fruits and vegetables, lean meats and healthy fats take all medications exactly as prescribed call doctor with any symptoms you believe are related to your medicine call doctor when you experience any new symptoms go to all doctor appointments as scheduled adhere to prescribed diet: heart healthy Follow low sodium, heart healthy diet- read labels, limit fast food Try to get outside daily Take all medications as prescribed Stay well hydrated as you continue to recover from Covid Case closure today  Please call the care guide team at [phone] if you need to cancel or reschedule your appointment.   If you are experiencing a Mental Health or [place] or need someone to talk to, please call the Suicide and Crisis Lifeline: 988 call the [place] National Suicide Prevention Lifeline: (919) 177-9569 or TTY: 678-752-0291 TTY 4785779546) to talk to a trained counselor call 1-800-273-TALK (toll free, 24 hour hotline) go to [facility] [address], [place] [phone]) call the [place]: [phone] call 911   Patient verbalizes understanding of instructions and care plan provided today and agrees to view in [place]. Active MyChart status and patient understanding of how to access instructions and care plan via MyChart confirmed with patient.     Jacqlyn Larsen [facility], BSN RN Case Manager [facility] [phone] COVID-19: What to Do if You Are Sick If you test positive and are an older adult or someone who is at high risk of getting very sick from COVID-19, treatment may be available. Contact a healthcare provider right away after a positive test to determine if you are eligible, even if your symptoms are mild right now. You can also visit a Test to Treat location and, if eligible, receive a prescription from a provider. Don't delay: Treatment must be started within the first few days to be effective. If you have a fever, cough, or other symptoms, you might have COVID-19. Most people have mild illness and are able to recover at home. If you are sick: Keep track of your symptoms. If you have an emergency warning sign (including trouble breathing), call 911. Steps to help prevent the spread of COVID-19 if you are sick If you are sick with COVID-19 or think you might have COVID-19, follow the steps below to care for yourself and to help protect other people in your home and community. Stay home except to get medical care Stay home. Most people with COVID-19 have mild illness and can recover at home without medical care. Do not leave your home, except to get medical care. Do not visit public areas and do not go to places where you  are unable to wear a mask. Take care of yourself. Get rest and stay hydrated. Take over-the-counter medicines, such as acetaminophen, to help you feel better. Stay in touch with your doctor. Call before you get medical care. Be sure to get care if you  have trouble breathing, or have any other emergency warning signs, or if you think it is an emergency. Avoid public transportation, ride-sharing, or taxis if possible. Get tested If you have symptoms of COVID-19, get tested. While waiting for test results, stay away from others, including staying apart from those living in your household. Get tested as soon as possible after your symptoms start. Treatments may be available for people with COVID-19 who are at risk for becoming very sick. Don't delay: Treatment must be started early to be effective--some treatments must begin within 5 days of your first symptoms. Contact your healthcare provider right away if your test result is positive to determine if you are eligible. Self-tests are one of several options for testing for the virus that causes COVID-19 and may be more convenient than laboratory-based tests and point-of-care tests. Ask your healthcare provider or your local health department if you need help interpreting your test results. You can visit your state, tribal, local, and territorial health department's website to look for the latest local information on testing sites. Separate yourself from other people As much as possible, stay in a specific room and away from other people and pets in your home. If possible, you should use a separate bathroom. If you need to be around other people or animals in or outside of the home, wear a well-fitting mask. Tell your close contacts that they may have been exposed to COVID-19. An infected person can spread COVID-19 starting 48 hours (or 2 days) before the person has any symptoms or tests positive. By letting your close contacts know they may have been exposed to COVID-19, you are helping to protect everyone. See COVID-19 and Animals if you have questions about pets. If you are diagnosed with COVID-19, someone from the health department may call you. Answer the call to slow the spread. Monitor your  symptoms Symptoms of COVID-19 include fever, cough, or other symptoms. Follow care instructions from your healthcare provider and local health department. Your local health authorities may give instructions on checking your symptoms and reporting information. When to seek emergency medical attention Look for emergency warning signs* for COVID-19. If someone is showing any of these signs, seek emergency medical care immediately: Trouble breathing Persistent pain or pressure in the chest New confusion Inability to wake or stay awake Pale, gray, or blue-colored skin, lips, or nail beds, depending on skin tone *This list is not all possible symptoms. Please call your medical provider for any other symptoms that are severe or concerning to you. Call 911 or call ahead to your local emergency facility: Notify the operator that you are seeking care for someone who has or may have COVID-19. Call ahead before visiting your doctor Call ahead. Many medical visits for routine care are being postponed or done by phone or telemedicine. If you have a medical appointment that cannot be postponed, call your doctor's office, and tell them you have or may have COVID-19. This will help the office protect themselves and other patients. If you are sick, wear a well-fitting mask You should wear a mask if you must be around other people or animals, including pets (even at home). Wear a mask with the best fit, protection, and comfort for  you. You don't need to wear the mask if you are alone. If you can't put on a mask (because of trouble breathing, for example), cover your coughs and sneezes in some other way. Try to stay at least 6 feet away from other people. This will help protect the people around you. Masks should not be placed on young children under age 58 years, anyone who has trouble breathing, or anyone who is not able to remove the mask without help. Cover your coughs and sneezes Cover your mouth and nose with  a tissue when you cough or sneeze. Throw away used tissues in a lined trash can. Immediately wash your hands with soap and water for at least 20 seconds. If soap and water are not available, clean your hands with an alcohol-based hand sanitizer that contains at least 60% alcohol. Clean your hands often Wash your hands often with soap and water for at least 20 seconds. This is especially important after blowing your nose, coughing, or sneezing; going to the bathroom; and before eating or preparing food. Use hand sanitizer if soap and water are not available. Use an alcohol-based hand sanitizer with at least 60% alcohol, covering all surfaces of your hands and rubbing them together until they feel dry. Soap and water are the best option, especially if hands are visibly dirty. Avoid touching your eyes, nose, and mouth with unwashed hands. Handwashing Tips Avoid sharing personal household items Do not share dishes, drinking glasses, cups, eating utensils, towels, or bedding with other people in your home. Wash these items thoroughly after using them with soap and water or put in the dishwasher. Clean surfaces in your home regularly Clean and disinfect high-touch surfaces (for example, doorknobs, tables, handles, light switches, and countertops) in your "sick room" and bathroom. In shared spaces, you should clean and disinfect surfaces and items after each use by the person who is ill. If you are sick and cannot clean, a caregiver or other person should only clean and disinfect the area around you (such as your bedroom and bathroom) on an as needed basis. Your caregiver/other person should wait as long as possible (at least several hours) and wear a mask before entering, cleaning, and disinfecting shared spaces that you use. Clean and disinfect areas that may have blood, stool, or body fluids on them. Use household cleaners and disinfectants. Clean visible dirty surfaces with household cleaners containing  soap or detergent. Then, use a household disinfectant. Use a product from [employer] List N: Disinfectants for Coronavirus (POEUM-35). Be sure to follow the instructions on the label to ensure safe and effective use of the product. Many products recommend keeping the surface wet with a disinfectant for a certain period of time (look at "contact time" on the product label). You may also need to wear personal protective equipment, such as gloves, depending on the directions on the product label. Immediately after disinfecting, wash your hands with soap and water for 20 seconds. For completed guidance on cleaning and disinfecting your home, visit Complete Disinfection Guidance. Take steps to improve ventilation at home Improve ventilation (air flow) at home to help prevent from spreading COVID-19 to other people in your household. Clear out COVID-19 virus particles in the air by opening windows, using air filters, and turning on fans in your home. Use this interactive tool to learn how to improve air flow in your home. When you can be around others after being sick with COVID-19 Deciding when you can be around others is different  for different situations. Find out when you can safely end home isolation. For any additional questions about your care, contact your healthcare provider or state or local health department. 08/14/2020 Content source: [facility] Immunization and Respiratory Diseases ([facility]), Division of Viral Diseases This information is not intended to replace advice given to you by your health care provider. Make sure you discuss any questions you have with your health care provider. Document Revised: 02/01/2021 Document Reviewed: 02/01/2021 Elsevier Patient Education  [place].

## 2021-12-24 NOTE — Chronic Care Management (AMB) (Signed)
Chronic Care Management   CCM RN Visit Note  12/24/2021 Name: [name] MRN: [id number] DOB: [birth year]  Subjective: [name] is a 71 y.o. year old female who is a primary care patient of Moshe Cipro Norwood Levo, MD. The care management team was consulted for assistance with disease management and care coordination needs.    Engaged with patient by telephone for follow up visit in response to provider referral for case management and/or care coordination services.   Consent to Services:  The patient was given information about Chronic Care Management services, agreed to services, and gave verbal consent prior to initiation of services.  Please see initial visit note for detailed documentation.   Patient agreed to services and verbal consent obtained.   Assessment: Review of patient past medical history, allergies, medications, health status, including review of consultants reports, laboratory and other test data, was performed as part of comprehensive evaluation and provision of chronic care management services.   SDOH (Social Determinants of Health) assessments and interventions performed:    CCM Care Plan  Allergies  Allergen Reactions   Septra [Sulfamethoxazole-Trimethoprim] Dermatitis   Barbiturates Other (See Comments)    Prophyria    Chlordiazepoxide Other (See Comments)    unknown   Hydrocodone-Acetaminophen Hives and Other (See Comments)   Pentothal [Thiopental]     Hives?   Phenytoin Other (See Comments)    Prophyria    Rofecoxib Other (See Comments)    Prophyria    Cefuroxime Axetil Rash   Other    Wellbutrin [Bupropion] Palpitations    Outpatient Encounter Medications as of 12/24/2021  Medication Sig   acetaminophen (TYLENOL) 500 MG tablet Take 1,000 mg by mouth every 6 (six) hours as needed for moderate pain.   albuterol (VENTOLIN HFA) 108 (90 Base) MCG/ACT inhaler Inhale 2 puffs into the lungs every 6 (six) hours as needed for wheezing or shortness of  breath.   ALPRAZolam (XANAX) 0.25 MG tablet Take one tablet by mouth once daily, as needed, for anxiety   aspirin 81 MG EC tablet Take 1 tablet (81 mg total) by mouth daily.   benzonatate (TESSALON) 100 MG capsule Take 1 capsule (100 mg total) by mouth 2 (two) times daily as needed for cough.   Calcium Carbonate-Vitamin D 600-400 MG-UNIT tablet Take 1 tablet by mouth daily.   Cholecalciferol (VITAMIN D3) 5000 units TABS Take 5,000 Units by mouth daily.   Coenzyme Q10 100 MG capsule Take 100 mg by mouth 3 (three) times daily.   cyclopentolate (CYCLODRYL,CYCLOGYL) 1 % ophthalmic solution    EPINEPHrine 0.3 mg/0.3 mL IJ SOAJ injection Inject 0.3 mg into the muscle as needed for anaphylaxis.   fluticasone (FLONASE) 50 MCG/ACT nasal spray Place 2 sprays into both nostrils daily. (Patient taking differently: Place 2 sprays into both nostrils daily as needed for allergies.)   gabapentin (NEURONTIN) 100 MG capsule Take 1 capsule (100 mg total) by mouth at bedtime.   hydroxychloroquine (PLAQUENIL) 200 MG tablet Take 400 mg by mouth daily.    loratadine (CLARITIN) 10 MG tablet Take 10 mg by mouth daily.   losartan (COZAAR) 50 MG tablet Take 1 tablet (50 mg total) by mouth daily.   montelukast (SINGULAIR) 10 MG tablet Take 1 tablet (10 mg total) by mouth at bedtime.   Multiple Vitamins-Minerals (CENTRUM SILVER PO) Take 1 tablet by mouth daily.   Omega-3 Fatty Acids (FISH OIL) 1000 MG CAPS Take 1 capsule (1,000 mg total) by mouth daily.   omeprazole (PRILOSEC) 20  MG capsule Take 20 mg by mouth at bedtime.   pravastatin (PRAVACHOL) 10 MG tablet TAKE 1 TABLET BY MOUTH ONCE DAILY STOP  TAKING  CRESTOR   Turmeric 500 MG CAPS Take 1 capsule by mouth.   vitamin B-12 (CYANOCOBALAMIN) 50 MCG tablet Take 50 mcg by mouth daily.   Zinc 50 MG TABS Take 50 mg by mouth daily.   buPROPion (WELLBUTRIN SR) 150 MG 12 hr tablet Take by mouth.   hydrOXYzine (ATARAX) 50 MG tablet 1 tablet as needed   No  facility-administered encounter medications on file as of 12/24/2021.    Patient Active Problem List   Diagnosis Date Noted   Primary localized osteoarthrosis of multiple sites 12/10/2021   Bilateral shoulder pain 07/18/2020   Stiffness of right knee 07/18/2020   Radiation dermatitis 09/21/2019   Non-Hodgkin's lymphoma of extranodal site ([place]) 07/19/2019   Insomnia due to anxiety and fear 07/03/2019   MALT lymphoma ([place]) 06/07/2019   Osteoporosis 04/28/2018   Abnormal CT scan, chest 06/10/2016   Rheumatoid arthritis ([place]) 05/13/2016   Annual physical exam 04/12/2015   GERD (gastroesophageal reflux disease) 03/24/2014   Obesity (BMI 30-39.9) 82/42/3536   Metabolic syndrome X 14/43/1540   Prediabetes 09/28/2011   Allergic rhinitis 09/04/2009   Hyperlipidemia LDL goal <100 08/12/2007   Essential hypertension 08/12/2007    Conditions to be addressed/monitored:HTN and HLD  Care Plan : RN Care Manager plan of care  Updates made by Kassie Mends, RN since 12/24/2021 12:00 AM  Completed 12/24/2021   Problem: No plan of care established for management of chronic disease states (HTN, HLD, Lymphoma) Resolved 12/24/2021  Priority: High     Long-Range Goal: Develoment of Plan of Care for chronic disease management (HTN, HLD, Lymphoma) Completed 12/24/2021  Start Date: 04/23/2021  Expected End Date: 03/30/2022  Priority: High  Note:   Current Barriers:  Knowledge Deficits related to plan of care for management of HTN and HLD  Patient reports she lives alone and has sisters nearby who can assist her if needed, pt still drives, is independent with all aspects of her care, has cane to use if needed, pt reports she has arthritis and this hinders her ability to exercise, she walks in her house sometimes and also stretches.  Patient checks blood pressure 2-3 times per month, reports readings have been within normal limits , has advanced directives in place. Patient reports she had DEXA scan and followed  up with Dr. Dorris Fetch and has diagnosis osteoporosis, has quit taking fosamax because IV Reclast completed on 09/17/21 and this is a yearly infusion. Poorly controlled hyperlipidemia, complicated by diet, lack of consistent exercise (unable due to rheumatoid arthritis) Current antihyperlipidemic regimen: pravastatin Continues following up with oncology- Dr. Delton Coombes- for lymphoma Patient reports she has had covid and saw doctor on 12/10/21, took paxlovid and is "getting better"  RNCM Clinical Goal(s):  Patient will verbalize understanding of plan for management of HTN and HLD as evidenced by patient report, review of EHR and  through collaboration with RN Care manager, provider, and care team.   Interventions: 1:1 collaboration with primary care provider regarding development and update of comprehensive plan of care as evidenced by provider attestation and co-signature Inter-disciplinary care team collaboration (see longitudinal plan of care) Evaluation of current treatment plan related to  self management and patient's adherence to plan as established by provider  Hyperlipidemia Interventions:  (Status:  Goal on track:  Yes.) Long Term Goal Medication review performed; medication list updated in  electronic medical record.  Counseled on importance of regular laboratory monitoring as prescribed Reviewed role and benefits of statin for ASCVD risk reduction Reviewed importance of limiting foods high in cholesterol Reviewed exercise goals and target of 150 minutes per week Pain assessment completed Reinforced heart healthy diet, reading labels  Hypertension Interventions:  (Status:  Goal on track:  Yes.) Long Term Goal Last practice recorded BP readings:  BP Readings from Last 3 Encounters:  04/08/21 (!) 142/88  03/22/21 134/80  01/11/21 138/84  Most recent eGFR/CrCl: No results found for: EGFR  No components found for: CRCL  Evaluation of current treatment plan related to hypertension self  management and patient's adherence to plan as established by provider Advised patient, providing education and rationale, to monitor blood pressure daily and record, calling PCP for findings outside established parameters Reinforced low sodium diet, limiting fast food Reviewed upcoming scheduled appointments  Reviewed plan of care with patient including case closure today  Patient Goals/Self-Care Activities: Take all medications as prescribed Attend all scheduled provider appointments Call pharmacy for medication refills 3-7 days in advance of running out of medications Perform all self care activities independently  Perform IADL's (shopping, preparing meals, housekeeping, managing finances) independently Call provider office for new concerns or questions  check blood pressure weekly choose a place to take my blood pressure (home, clinic or office, retail store) write blood pressure results in a log or diary keep a blood pressure log take blood pressure log to all doctor appointments keep all doctor appointments take medications for blood pressure exactly as prescribed eat more whole grains, fruits and vegetables, lean meats and healthy fats take all medications exactly as prescribed call doctor with any symptoms you believe are related to your medicine call doctor when you experience any new symptoms go to all doctor appointments as scheduled adhere to prescribed diet: heart healthy Follow low sodium, heart healthy diet- read labels, limit fast food Try to get outside daily Take all medications as prescribed Stay well hydrated as you continue to recover from Covid Case closure today        Plan:No further follow up required: Case closure  Jacqlyn Larsen [facility], BSN RN Case Manager [facility] [phone]

## 2021-12-31 ENCOUNTER — Ambulatory Visit (INDEPENDENT_AMBULATORY_CARE_PROVIDER_SITE_OTHER): Payer: Medicare Other | Admitting: "Endocrinology

## 2021-12-31 ENCOUNTER — Encounter: Payer: Self-pay | Admitting: "Endocrinology

## 2021-12-31 VITALS — BP 120/78 | HR 88 | Ht 67.0 in | Wt 204.2 lb

## 2021-12-31 DIAGNOSIS — M818 Other osteoporosis without current pathological fracture: Secondary | ICD-10-CM

## 2021-12-31 NOTE — Progress Notes (Signed)
12/31/2021     Endocrinology follow-up note     Past Medical History:  Diagnosis Date   Acute left-sided back pain with sciatica 11/27/2018   Annual physical exam 04/12/2015   Arthritis    Phreesia 05/01/2020   Arthritis, rheumatoid ([place]) 1993   hx   Cancer ([facility])    Phreesia 05/01/2020   Collagen vascular disease (HCC)    RA   COPD (chronic obstructive pulmonary disease) (HCC)    Hyperlipidemia    Hypertension    Kidney stone 1999   Non Hodgkin's lymphoma ([place])    Obesity    Osteoporosis 2019   intolerant of fosamax due to jaw pain   Porphyria ([place])    hx    Past Surgical History:  Procedure Laterality Date   APPENDECTOMY  1983   BREAST BIOPSY Left    diagnosid with non hogdkins lymphoma   COLONOSCOPY WITH PROPOFOL N/A 05/30/2020   Procedure: COLONOSCOPY WITH PROPOFOL;  Surgeon: Rogene Houston, MD;  [facility];  Service: Endoscopy;  Laterality: N/A;  10:30   LITHOTRIPSY  2000   rt renal stone    POLYPECTOMY  05/30/2020   Procedure: POLYPECTOMY;  Surgeon: Rogene Houston, MD;  Location: [facility];  Service: Endoscopy;;   Social History   Socioeconomic History   Marital status: Single    Spouse name: Not on file   Number of children: 0   Years of education: 12+   Highest education level: Doctorate  Occupational History   Occupation: Retired; Teacher part time currently  Tobacco Use   Smoking status: Some Days    Packs/day: 0.50    Types: Cigarettes    Last attempt to quit: 05/25/2020    Years since quitting: 1.6   Smokeless tobacco: Never   Tobacco comments:    only has one occasionally  Vaping Use   Vaping Use: Never used  Substance and Sexual Activity   Alcohol use: No   Drug use: No   Sexual activity: Not Currently  Other Topics Concern   Not on file  Social History Narrative   Lives alone    Social Determinants of Health   Financial Resource Strain: Low Risk   (05/20/2021)   Overall Financial Resource Strain (CARDIA)    Difficulty of Paying Living Expenses: Not hard at all  Food Insecurity: No Food Insecurity (05/21/2021)   Hunger Vital Sign    Worried About Running Out of Food in the Last Year: Never true    Ran Out of Food in the Last Year: Never true  Transportation Needs: No Transportation Needs (05/20/2021)   PRAPARE - Hydrologist (Medical): No    Lack of Transportation (Non-Medical): No  Physical Activity: Insufficiently Active (05/20/2021)   Exercise Vital Sign    Days of Exercise per Week: 1 day    Minutes of Exercise per Session: 10 min  Stress: No Stress Concern Present (05/20/2021)   [place]    Feeling of Stress : Only a little  Social Connections: Unknown (05/20/2021)   Social Connection and Isolation Panel [NHANES]  Frequency of Communication with Friends and Family: More than three times a week    Frequency of Social Gatherings with Friends and Family: More than three times a week    Attends Religious Services: Not on file    Active Member of Clubs or Organizations: Yes    Attends Archivist Meetings: More than 4 times per year    Marital Status: Never married   Outpatient Encounter Medications as of 12/31/2021  Medication Sig   acetaminophen (TYLENOL) 500 MG tablet Take 1,000 mg by mouth every 6 (six) hours as needed for moderate pain.   albuterol (VENTOLIN HFA) 108 (90 Base) MCG/ACT inhaler Inhale 2 puffs into the lungs every 6 (six) hours as needed for wheezing or shortness of breath.   ALPRAZolam (XANAX) 0.25 MG tablet Take one tablet by mouth once daily, as needed, for anxiety   aspirin 81 MG EC tablet Take 1 tablet (81 mg total) by mouth daily.   benzonatate (TESSALON) 100 MG capsule Take 1 capsule (100 mg total) by mouth 2 (two) times daily as needed for cough.   Calcium Carbonate-Vitamin D 600-400 MG-UNIT  tablet Take 1 tablet by mouth 2 (two) times daily with a meal.     Cholecalciferol (VITAMIN D3) 5000 units TABS Take 5,000 Units by mouth daily.   Coenzyme Q10 100 MG capsule Take 100 mg by mouth 3 (three) times daily.   EPINEPHrine 0.3 mg/0.3 mL IJ SOAJ injection Inject 0.3 mg into the muscle as needed for anaphylaxis.   fluticasone (FLONASE) 50 MCG/ACT nasal spray Place 2 sprays into both nostrils daily. (Patient taking differently: Place 2 sprays into both nostrils daily as needed for allergies.)   gabapentin (NEURONTIN) 100 MG capsule Take 1 capsule (100 mg total) by mouth at bedtime.   hydroxychloroquine (PLAQUENIL) 200 MG tablet Take 400 mg by mouth daily.    loratadine (CLARITIN) 10 MG tablet Take 10 mg by mouth daily.   losartan (COZAAR) 50 MG tablet Take 1 tablet (50 mg total) by mouth daily.   montelukast (SINGULAIR) 10 MG tablet Take 1 tablet (10 mg total) by mouth at bedtime.   Multiple Vitamins-Minerals (CENTRUM SILVER PO) Take 1 tablet by mouth daily.   Omega-3 Fatty Acids (FISH OIL) 1000 MG CAPS Take 1 capsule (1,000 mg total) by mouth daily.   omeprazole (PRILOSEC) 20 MG capsule Take 20 mg by mouth at bedtime.   pravastatin (PRAVACHOL) 10 MG tablet TAKE 1 TABLET BY MOUTH ONCE DAILY STOP  TAKING  CRESTOR   Turmeric 500 MG CAPS Take 1 capsule by mouth.   vitamin B-12 (CYANOCOBALAMIN) 50 MCG tablet Take 50 mcg by mouth daily.   Zinc 50 MG TABS Take 50 mg by mouth daily.   [DISCONTINUED] buPROPion (WELLBUTRIN SR) 150 MG 12 hr tablet Take by mouth.   [DISCONTINUED] cyclopentolate (CYCLODRYL,CYCLOGYL) 1 % ophthalmic solution    [DISCONTINUED] hydrOXYzine (ATARAX) 50 MG tablet 1 tablet as needed   No facility-administered encounter medications on file as of 12/31/2021.   ALLERGIES: Allergies  Allergen Reactions   Septra [Sulfamethoxazole-Trimethoprim] Dermatitis   Barbiturates Other (See Comments)    Prophyria    Chlordiazepoxide Other (See Comments)    unknown    Hydrocodone-Acetaminophen Hives and Other (See Comments)   Pentothal [Thiopental]     Hives?   Phenytoin Other (See Comments)    Prophyria    Rofecoxib Other (See Comments)    Prophyria    Cefuroxime Axetil Rash   Other    Wellbutrin [Bupropion]  Palpitations    VACCINATION STATUS: Immunization History  Administered Date(s) Administered   Fluad Quad(high Dose 65+) 03/07/2019, 02/14/2021   Influenza Split 02/04/2020   Influenza Whole 06/16/2006, 05/17/2009   Influenza,inj,Quad PF,6+ Mos 02/12/2018, 02/04/2020   MODERNA COVID-19 SARS-COV-2 PEDS BIVALENT BOOSTER 6Y-11Y 02/28/2021   Moderna Sars-Covid-2 Vaccination 06/30/2019, 07/29/2019, 03/10/2020, 09/03/2020   Pneumococcal Conjugate-13 12/21/2017   Pneumococcal Polysaccharide-23 06/07/2019   Td 06/16/2006   Tdap 03/24/2014   Zoster, Live 09/28/2014     HPI   [name] is 71 y.o. female who presents today with a medical history as above. she is being seen in follow-up after she was seen in consultation for  Osteoporosis requested by Fayrene Helper, MD.  Patient was diagnosed with osteoporosis  approximately  2 years ago. She denies fractures or falls. No dizziness/vertigo/orthostasis. She is status post Reclast infusion in April 2023.  She did report some discomfort after injection, however generally tolerating the medication and  I reviewed pt's DEXA scans: from 2019 and 2022 she has a 28-30% bone loss , worse at spine. She previously took Fosamax 70 mg p.o. weekly, intermittently. She took 12/24 months roughly, prior to her last visit.  she is on regular calcium and vitamin d supplement.  Her previsit labs show below normal range.   She also eats dairy and green, leafy, vegetables.   No weight bearing exercises.   She does not take high vitamin A doses No h/o hyper/hypocalcemia. No h/o hyperparathyroidism. No h/o kidney stones. Lab Results  Component Value Date   CALCIUM 8.5 (L) 10/07/2021   CALCIUM 9.0  04/04/2021   CALCIUM 8.7 (L) 09/25/2020   CALCIUM 9.5 03/27/2020   CALCIUM 9.3 10/05/2019   CALCIUM 9.4 06/07/2019   CALCIUM 9.1 11/17/2018   CALCIUM 9.1 05/10/2018   CALCIUM 8.8 05/12/2017   CALCIUM 9.4 06/08/2016    Lab Results  Component Value Date   TSH 1.020 03/20/2021   TSH 0.90 05/10/2018   TSH 0.71 05/24/2016   TSH 0.803 04/11/2015   TSH 0.770 12/24/2011    No h/o CKD. Last BUN/Cr: Lab Results  Component Value Date   BUN 14 10/07/2021   CREATININE 0.82 10/07/2021   Pt does have a FH of osteoporosis. In her mother. She denies height loss. She is a survivor of  NHL.    Review of Systems  Constitutional: no weight gain/loss, no fatigue, no subjective hyperthermia, no subjective hypothermia Eyes: no blurry vision, no xerophthalmia ENT: no sore throat, no nodules palpated in throat, no dysphagia/odynophagia, no hoarseness   Objective:    BP 120/78   Pulse 88   Ht '5\' 7"'$  (1.702 m)   Wt 204 lb 3.2 oz (92.6 kg)   BMI 31.98 kg/m   Wt Readings from Last 3 Encounters:  12/31/21 204 lb 3.2 oz (92.6 kg)  10/14/21 202 lb 14.4 oz (92 kg)  09/17/21 207 lb 6.4 oz (94.1 kg)    Physical Exam  Constitutional:  BMI of 31,  not in acute distress, normal state of mind Eyes: PERRLA, EOMI, no exophthalmos ENT: moist mucous membranes, no thyromegaly, no cervical lymphadenopathy   CMP ( most recent) CMP     Component Value Date/Time   NA 137 10/07/2021 1002   K 4.1 10/07/2021 1002   CL 108 10/07/2021 1002   CO2 26 10/07/2021 1002   GLUCOSE 95 10/07/2021 1002   BUN 14 10/07/2021 1002   CREATININE 0.82 10/07/2021 1002   CREATININE 0.85 11/17/2018 0743  CALCIUM 8.5 (L) 10/07/2021 1002   PROT 7.2 10/07/2021 1002   ALBUMIN 3.6 10/07/2021 1002   AST 19 10/07/2021 1002   ALT 16 10/07/2021 1002   ALKPHOS 71 10/07/2021 1002   BILITOT 0.3 10/07/2021 1002   GFRNONAA >60 10/07/2021 1002   GFRNONAA 72 11/17/2018 0743   GFRAA >60 10/05/2019 1427   GFRAA 83 11/17/2018  0743     Diabetic Labs (most recent): Lab Results  Component Value Date   HGBA1C 5.4 11/17/2018   HGBA1C 5.7 (H) 05/12/2017   HGBA1C 5.5 05/24/2016     Lipid Panel ( most recent) Lipid Panel     Component Value Date/Time   CHOL 186 03/20/2021 0835   TRIG 99 03/20/2021 0835   HDL 54 03/20/2021 0835   CHOLHDL 3.4 03/20/2021 0835   CHOLHDL 3.1 03/27/2020 1414   VLDL 22 03/27/2020 1414   LDLCALC 114 (H) 03/20/2021 0835   LDLCALC 122 (H) 11/17/2018 0743   LABVLDL 18 03/20/2021 0835      Lab Results  Component Value Date   TSH 1.020 03/20/2021   TSH 0.90 05/10/2018   TSH 0.71 05/24/2016   TSH 0.803 04/11/2015   TSH 0.770 12/24/2011   TSH 1.766 12/19/2010   TSH 0.933 10/06/2007     DXA  : T- Score -3.0 spine, 04/2021, -2.9 in 03/2018 DXA : Femur -3.0 2022, -3.1 2019       Assessment: 1. Osteoporosis  Plan: 1. Osteoporosis - likely multifactorial vs postmenopausal  -Patient is status post Reclast infusion in April 2023. Her previsit labs show mild hypocalcemia, discussed and increase her calcium supplement to calcium carbonate 600-400 mg to twice a day with meals.  We discussed about increased risk of fracture, depending on the T score, greatly increased when the T score is lower than -2.5. -Her next bone density due December 2024.  - she wishes to avoid regular medications, would like to continue on yearly infusion of Reclast.  Her next infusion will be in April 2024.    She will benefit from IV bisphosphonate, zoledronic acid (iv Reclast) from the point of view of benefit from spine BMD.  - discussed fall precautions    She has normal renal function.  She will return in 8 months with repeat labs to assess renal function and calcium.  And will be the general schedule of her next Reclast infusion.  - I advised patient to maintain close follow up with Fayrene Helper, MD for primary care needs.   I spent 23 minutes in the care of the patient today  including review of labs from Thyroid Function, CMP, and other relevant labs ; imaging/biopsy records (current and previous including abstractions from other facilities); face-to-face time discussing  her lab results and symptoms, medications doses, her options of short and long term treatment based on the latest standards of care / guidelines;   and documenting the encounter.  [name]  participated in the discussions, expressed understanding, and voiced agreement with the above plans.  All questions were answered to her satisfaction. she is encouraged to contact clinic should she have any questions or concerns prior to her return visit.   Follow up plan: Return in about 8 months (around 09/01/2022), or Reclast Infusion in April 2024 and yearly, for F/U with Pre-visit Labs, Refer her for Reclast Infusion.   Glade Lloyd, MD [facility] [address] [place], [place] [address] Phone: [phone]  Fax: [phone]  12/31/2021, 4:48 PM  This note was partially dictated with voice recognition software. Similar sounding words can be transcribed inadequately or may not  be corrected upon review.

## 2022-01-17 ENCOUNTER — Other Ambulatory Visit: Payer: Self-pay | Admitting: Family Medicine

## 2022-01-17 DIAGNOSIS — E785 Hyperlipidemia, unspecified: Secondary | ICD-10-CM

## 2022-01-23 DIAGNOSIS — E785 Hyperlipidemia, unspecified: Secondary | ICD-10-CM

## 2022-01-23 DIAGNOSIS — I10 Essential (primary) hypertension: Secondary | ICD-10-CM | POA: Diagnosis not present

## 2022-01-24 ENCOUNTER — Encounter: Payer: Self-pay | Admitting: Family Medicine

## 2022-01-24 ENCOUNTER — Ambulatory Visit (INDEPENDENT_AMBULATORY_CARE_PROVIDER_SITE_OTHER): Payer: Medicare Other | Admitting: Family Medicine

## 2022-01-24 VITALS — BP 128/84 | HR 68 | Ht 67.0 in | Wt 200.1 lb

## 2022-01-24 DIAGNOSIS — R109 Unspecified abdominal pain: Secondary | ICD-10-CM | POA: Diagnosis not present

## 2022-01-24 DIAGNOSIS — R1011 Right upper quadrant pain: Secondary | ICD-10-CM

## 2022-01-24 LAB — POCT URINALYSIS DIP (CLINITEK)
Bilirubin, UA: NEGATIVE
Blood, UA: NEGATIVE
Glucose, UA: NEGATIVE mg/dL
Ketones, POC UA: NEGATIVE mg/dL
Leukocytes, UA: NEGATIVE
Nitrite, UA: NEGATIVE
POC PROTEIN,UA: NEGATIVE
Spec Grav, UA: <=1.005 — AB (ref 1.010–1.025)
Urobilinogen, UA: 0.2 E.U./dL
pH, UA: 5.5 (ref 5.0–8.0)

## 2022-01-24 MED ORDER — OXYCODONE HCL 5 MG PO TABS: 5.0000 mg | ORAL_TABLET | ORAL | 0 refills | Status: DC | PRN

## 2022-01-24 MED ORDER — KETOROLAC TROMETHAMINE 30 MG/ML IJ SOLN
30.0000 mg | Freq: Once | INTRAMUSCULAR | Status: AC
  Administered 2022-01-24: 30 mg via INTRAMUSCULAR

## 2022-01-24 NOTE — Progress Notes (Unsigned)
Acute Office Visit  Subjective:     Patient ID: [name], female    DOB: 1950-12-27, 71 y.o.   MRN: [id number]  Chief Complaint  Patient presents with   Flank Pain    Pt c/o flank pain on her right side, onset of pain began on 08/29, has been getting worse since then     HPI The patient is in today for flank pain. The onset of symptoms began on 01/21/22. She denies hematuria, nausea, vomiting, urgency, frequency, and a fever. She reports hx of appendectomy. She c/o of sharp pain in the RLQ that radiates to her right side and back: the pain waxes and wanes with relief of symptoms with Motrin. Pain is rated 9/10. Review of Systems  Constitutional:  Negative for chills and fever.  Gastrointestinal:  Positive for abdominal pain. Negative for heartburn, nausea and vomiting.  Genitourinary:  Positive for flank pain. Negative for frequency, hematuria and urgency.        Objective:    BP 128/84   Pulse 68   Ht _0  (1.702 m)   Wt 200 lb 1.9 oz (90.8 kg)   SpO2 96%   BMI 31.34 kg/m    Physical Exam Cardiovascular:     Rate and Rhythm: Normal rate and regular rhythm.     Heart sounds: Normal heart sounds.  Pulmonary:     Effort: Pulmonary effort is normal.     Breath sounds: Normal breath sounds.  Abdominal:     Tenderness: There is abdominal tenderness in the right lower quadrant and suprapubic area. There is no rebound. Negative signs include Murphy's sign, Rovsing's sign, psoas sign and obturator sign.  Neurological:     Mental Status: She is alert.     Results for orders placed or performed in visit on 70/96/28  Basic Metabolic Panel (BMET)  Result Value Ref Range   Glucose 89 70 - 99 mg/dL   BUN 9 8 - 27 mg/dL   Creatinine, Ser 0.94 0.57 - 1.00 mg/dL   eGFR 66 >59 mL/min/1.73   BUN/Creatinine Ratio 10 (L) 12 - 28   Sodium 142 134 - 144 mmol/L   Potassium 4.7 3.5 - 5.2 mmol/L   Chloride 103 96 - 106 mmol/L   CO2 21 20 - 29 mmol/L   Calcium 9.6 8.7 - 10.3  mg/dL  POCT URINALYSIS DIP (CLINITEK)  Result Value Ref Range   Color, UA yellow yellow   Clarity, UA clear clear   Glucose, UA negative negative mg/dL   Bilirubin, UA negative negative   Ketones, POC UA negative negative mg/dL   Spec Grav, UA <=1.005 (A) 1.010 - 1.025   Blood, UA negative negative   pH, UA 5.5 5.0 - 8.0   POC PROTEIN,UA negative negative, trace   Urobilinogen, UA 0.2 0.2 or 1.0 E.U./dL   Nitrite, UA Negative Negative   Leukocytes, UA Negative Negative        Assessment & Plan:   Problem List Items Addressed This Visit       Other   Flank pain, acute - Primary    UA negative for hematuria, leukocytes, and nitrates Symptoms likely of nephrolithiasis given the patient's recent intake of Calcium supplements started by Dr. Dorris Fetch Encouraged to increase her fluid intake Toradol injection given in the clinic  Will provide a short supply of narcotics  Pending BMP CT scan of the abdomen and pelvis ordered      Other Visit Diagnoses  Right upper quadrant abdominal pain       Relevant Medications   ketorolac (TORADOL) 30 MG/ML injection 30 mg (Completed)   oxyCODONE (OXY IR/ROXICODONE) 5 MG immediate release tablet   Other Relevant Orders   CT ABDOMEN PELVIS W WO CONTRAST   Basic Metabolic Panel (BMET) (Completed)   Flank pain       Relevant Orders   POCT URINALYSIS DIP (CLINITEK) (Completed)       Meds ordered this encounter  Medications   ketorolac (TORADOL) 30 MG/ML injection 30 mg   oxyCODONE (OXY IR/ROXICODONE) 5 MG immediate release tablet    Sig: Take 1 tablet (5 mg total) by mouth every 4 (four) hours as needed for severe pain.    Dispense:  10 tablet    Refill:  0    Return if symptoms worsen or fail to improve.  Alvira Monday, FNP

## 2022-01-24 NOTE — Patient Instructions (Addendum)
I appreciate the opportunity to provide care to you today!    Follow up: Dr. Moshe Cipro  Labs: please stop by the lab today to get your blood drawn (BMP)  Please pick up your medication at the pharmacy  Please stop by [facility] anytime to get CT scan of abdomen and pelvis     Please stop by your local pharmacy and get your Tdap and Shingles vaccine   Please continue to a heart-healthy diet and increase your physical activities. Try to exercise for 62mns at least three times a week.      It was a pleasure to see you and I look forward to continuing to work together on your health and well-being. Please do not hesitate to call the office if you need care or have questions about your care.   Have a wonderful day and week. With Gratitude, GAlvira MondayMSN, FNP-BC

## 2022-01-25 LAB — BASIC METABOLIC PANEL
BUN/Creatinine Ratio: 10 — ABNORMAL LOW (ref 12–28)
BUN: 9 mg/dL (ref 8–27)
CO2: 21 mmol/L (ref 20–29)
Calcium: 9.6 mg/dL (ref 8.7–10.3)
Chloride: 103 mmol/L (ref 96–106)
Creatinine, Ser: 0.94 mg/dL (ref 0.57–1.00)
Glucose: 89 mg/dL (ref 70–99)
Potassium: 4.7 mmol/L (ref 3.5–5.2)
Sodium: 142 mmol/L (ref 134–144)
eGFR: 66 mL/min/{1.73_m2} (ref >59)

## 2022-01-27 DIAGNOSIS — R109 Unspecified abdominal pain: Secondary | ICD-10-CM | POA: Insufficient documentation

## 2022-01-27 NOTE — Assessment & Plan Note (Addendum)
UA negative for hematuria, leukocytes, and nitrates Symptoms likely of nephrolithiasis given the patient's recent intake of Calcium supplements started by Dr. Dorris Fetch Encouraged to increase her fluid intake Toradol injection given in the clinic  Will provide a short supply of narcotics  Pending BMP CT scan of the abdomen and pelvis ordered

## 2022-01-28 NOTE — Progress Notes (Signed)
Please inform the patient her kidney function is stable with her recent labs. Her BUN/ Cr ration is slightly low; this could be from inadequate protein intake. I recommend increasing her intake of protein.

## 2022-02-05 ENCOUNTER — Other Ambulatory Visit (HOSPITAL_COMMUNITY): Payer: Medicare Other

## 2022-02-12 ENCOUNTER — Encounter: Payer: Self-pay | Admitting: *Deleted

## 2022-02-12 ENCOUNTER — Ambulatory Visit: Payer: Self-pay | Admitting: *Deleted

## 2022-02-12 NOTE — Patient Outreach (Signed)
  Care Coordination   Initial Visit Note   02/12/2022  Name: [name] MRN: [id number] DOB: 04-15-1951  [name] is a 71 y.o. year old female who sees Moshe Cipro, Norwood Levo, MD for primary care. I spoke with Marinell Blight by phone today.  What matters to the patients health and wellness today?  No Interventions Identified.   SDOH assessments and interventions completed:  Yes.  SDOH Interventions Today    Flowsheet Row Most Recent Value  SDOH Interventions   Food Insecurity Interventions Intervention Not Indicated  Housing Interventions Intervention Not Indicated  Transportation Interventions Intervention Not Indicated  Utilities Interventions Intervention Not Indicated  Alcohol Usage Interventions Intervention Not Indicated (Score <7)  Financial Strain Interventions Intervention Not Indicated  Physical Activity Interventions Patient Refused  Stress Interventions Intervention Not Indicated  Social Connections Interventions Intervention Not Indicated       Care Coordination Interventions Activated:  Yes.    Care Coordination Interventions:  Yes, provided.    Follow up plan: No further intervention required.    Encounter Outcome:  Pt. Visit Completed.    Nat Christen, BSW, MSW, LCSW  Licensed Education officer, [facility]  Mailing [place] [address] [place], [place] [address] Physical Address-[address], [place], [place] [address] Toll Free Main # [phone] Fax # [phone] Cell # [phone] [email or web address]

## 2022-02-12 NOTE — Patient Instructions (Signed)
Visit Information  Thank you for taking time to visit with me today. Please don't hesitate to contact me if I can be of assistance to you.   Please call the care guide team at [phone] if you need to cancel or reschedule your appointment.   If you are experiencing a Mental Health or Behavioral Health Crisis or need someone to talk to, please call the Suicide and Crisis Lifeline: 988 call the [place] National Suicide Prevention Lifeline: 1-800-273-8255 or TTY: 1-800-799-4 TTY (1-800-799-4889) to talk to a trained counselor call 1-800-273-TALK (toll free, 24 hour hotline) go to [facility] [address], [place] ([phone]) call the [place] Crisis Line: 800-939-9988 call 911  Patient verbalizes understanding of instructions and care plan provided today and agrees to view in MyChart. Active MyChart status and patient understanding of how to access instructions and care plan via MyChart confirmed with patient.     No further follow up required.  Greyson Riccardi, BSW, MSW, LCSW  Licensed Clinical Social Worker  [facility]  Mailing Address-[address], [place], [place] [address] Physical Address-[address], , [place] [address] Toll Free Main # [phone] Fax # [phone] Cell # [phone] [email or web address]            

## 2022-03-24 DIAGNOSIS — E559 Vitamin D deficiency, unspecified: Secondary | ICD-10-CM | POA: Diagnosis not present

## 2022-03-24 DIAGNOSIS — I10 Essential (primary) hypertension: Secondary | ICD-10-CM | POA: Diagnosis not present

## 2022-03-24 DIAGNOSIS — E785 Hyperlipidemia, unspecified: Secondary | ICD-10-CM | POA: Diagnosis not present

## 2022-03-25 ENCOUNTER — Other Ambulatory Visit: Payer: Self-pay | Admitting: Family Medicine

## 2022-03-25 ENCOUNTER — Ambulatory Visit (INDEPENDENT_AMBULATORY_CARE_PROVIDER_SITE_OTHER): Payer: Medicare Other | Admitting: Family Medicine

## 2022-03-25 ENCOUNTER — Encounter: Payer: Self-pay | Admitting: Family Medicine

## 2022-03-25 VITALS — BP 128/82 | HR 92 | Ht 67.0 in | Wt 197.1 lb

## 2022-03-25 DIAGNOSIS — H6591 Unspecified nonsuppurative otitis media, right ear: Secondary | ICD-10-CM | POA: Insufficient documentation

## 2022-03-25 DIAGNOSIS — M05769 Rheumatoid arthritis with rheumatoid factor of unspecified knee without organ or systems involvement: Secondary | ICD-10-CM

## 2022-03-25 DIAGNOSIS — E785 Hyperlipidemia, unspecified: Secondary | ICD-10-CM

## 2022-03-25 DIAGNOSIS — Z0000 Encounter for general adult medical examination without abnormal findings: Secondary | ICD-10-CM | POA: Diagnosis not present

## 2022-03-25 DIAGNOSIS — H6501 Acute serous otitis media, right ear: Secondary | ICD-10-CM | POA: Diagnosis not present

## 2022-03-25 LAB — CMP14+EGFR
ALT: 15 IU/L (ref 0–32)
AST: 17 IU/L (ref 0–40)
Albumin/Globulin Ratio: 1.2 (ref 1.2–2.2)
Albumin: 3.8 g/dL — ABNORMAL LOW (ref 3.9–4.9)
Alkaline Phosphatase: 77 IU/L (ref 44–121)
BUN/Creatinine Ratio: 19 (ref 12–28)
BUN: 17 mg/dL (ref 8–27)
Bilirubin Total: 0.3 mg/dL (ref 0.0–1.2)
CO2: 23 mmol/L (ref 20–29)
Calcium: 8.8 mg/dL (ref 8.7–10.3)
Chloride: 105 mmol/L (ref 96–106)
Creatinine, Ser: 0.91 mg/dL (ref 0.57–1.00)
Globulin, Total: 3.1 g/dL (ref 1.5–4.5)
Glucose: 78 mg/dL (ref 70–99)
Potassium: 4.2 mmol/L (ref 3.5–5.2)
Sodium: 140 mmol/L (ref 134–144)
Total Protein: 6.9 g/dL (ref 6.0–8.5)
eGFR: 68 mL/min/{1.73_m2} (ref >59)

## 2022-03-25 LAB — CBC
Hematocrit: 40.8 % (ref 34.0–46.6)
Hemoglobin: 13 g/dL (ref 11.1–15.9)
MCH: 27.3 pg (ref 26.6–33.0)
MCHC: 31.9 g/dL (ref 31.5–35.7)
MCV: 86 fL (ref 79–97)
Platelets: 249 10*3/uL (ref 150–450)
RBC: 4.77 x10E6/uL (ref 3.77–5.28)
RDW: 14.6 % (ref 11.7–15.4)
WBC: 11.4 10*3/uL — ABNORMAL HIGH (ref 3.4–10.8)

## 2022-03-25 LAB — LIPID PANEL
Chol/HDL Ratio: 3.1 ratio (ref 0.0–4.4)
Cholesterol, Total: 169 mg/dL (ref 100–199)
HDL: 55 mg/dL (ref >39)
LDL Chol Calc (NIH): 98 mg/dL (ref 0–99)
Triglycerides: 83 mg/dL (ref 0–149)
VLDL Cholesterol Cal: 16 mg/dL (ref 5–40)

## 2022-03-25 LAB — VITAMIN D 25 HYDROXY (VIT D DEFICIENCY, FRACTURES): Vit D, 25-Hydroxy: 80.2 ng/mL (ref 30.0–100.0)

## 2022-03-25 LAB — TSH: TSH: 0.739 u[IU]/mL (ref 0.450–4.500)

## 2022-03-25 MED ORDER — PREDNISONE 10 MG PO TABS: 10.0000 mg | ORAL_TABLET | Freq: Two times a day (BID) | ORAL | 0 refills | Status: DC

## 2022-03-25 MED ORDER — CHLORPHENIRAMINE MALEATE 4 MG PO TABS
4.0000 mg | ORAL_TABLET | Freq: Two times a day (BID) | ORAL | 0 refills | Status: DC | PRN
Start: 2022-03-25 — End: 2023-06-02

## 2022-03-25 MED ORDER — ALBUTEROL SULFATE HFA 108 (90 BASE) MCG/ACT IN AERS: 2.0000 | INHALATION_SPRAY | Freq: Four times a day (QID) | RESPIRATORY_TRACT | 2 refills | Status: DC | PRN

## 2022-03-25 MED ORDER — LOSARTAN POTASSIUM 50 MG PO TABS: 50.0000 mg | ORAL_TABLET | Freq: Every day | ORAL | 3 refills | Status: DC

## 2022-03-25 MED ORDER — PRAVASTATIN SODIUM 10 MG PO TABS: 10.0000 mg | ORAL_TABLET | Freq: Every day | ORAL | 3 refills | Status: DC

## 2022-03-25 MED ORDER — MONTELUKAST SODIUM 10 MG PO TABS: 10.0000 mg | ORAL_TABLET | Freq: Every day | ORAL | 3 refills | Status: DC

## 2022-03-25 MED ORDER — GABAPENTIN 100 MG PO CAPS: 100.0000 mg | ORAL_CAPSULE | Freq: Every day | ORAL | 3 refills | Status: DC

## 2022-03-25 MED ORDER — EPINEPHRINE 0.3 MG/0.3ML IJ SOAJ: 0.3000 mg | INTRAMUSCULAR | 2 refills | Status: DC | PRN

## 2022-03-25 NOTE — Assessment & Plan Note (Signed)

## 2022-03-25 NOTE — Assessment & Plan Note (Signed)
Current pain flare, short course of prednisone, continu management through Rheumatology

## 2022-03-25 NOTE — Progress Notes (Signed)
    [name]     MRN: [id number]      DOB: 27-Jan-1951  HPI: Patient is in for annual physical exam. Increased generalized joint pain over the past week with weather change and variation in temperatures C/o right ear pain and pressure x 1 week Recent labs,  are reviewed. Immunization is reviewed , and  updated if needed.   PE: BP 128/82   Pulse 92   Ht '5\' 7"'$  (1.702 m)   Wt 197 lb 1.9 oz (89.4 kg)   SpO2 98%   BMI 30.87 kg/m   Pleasant  female, alert and oriented x 3, in no cardio-pulmonary distress. Afebrile. HEENT No facial trauma or asymetry. Sinuses non tender.  Extra occullar muscles intact.. Left TM clear , good light reflex. Right TM mildly dull , reducd light reflex and fluid behind drum External ears normal, . Neck: supple, no adenopathy,JVD or thyromegaly.No bruits.  Chest: Clear to ascultation bilaterally.No crackles or wheezes. Non tender to palpation   Cardiovascular system; Heart sounds normal,  S1 and  S2 ,no S3.  No murmur, or thrill. Apical beat not displaced Peripheral pulses normal.  Abdomen: Soft, non tender, no organomegaly or masses. No bruits. Bowel sounds normal. No guarding, tenderness or rebound.     Musculoskeletal exam: Full ROM of spine, hips , shoulders and knees.  deformity ,and  crepitus noted. No muscle wasting or atrophy.   Neurologic: Cranial nerves 2 to 12 intact. Power, tone ,sensation and reflexes normal throughout. No disturbance in gait. No tremor.  Skin: Intact, no ulceration, erythema , scaling or rash noted. Pigmentation normal throughout  Psych; Normal mood and affect. Judgement and concentration normal   Assessment & Plan:  Annual physical exam Annual exam as documented. Counseling done  re healthy lifestyle involving commitment to 150 minutes exercise per week, heart healthy diet, and attaining healthy weight.The importance of adequate sleep also discussed. Regular seat belt use and home safety, is  also discussed. Changes in health habits are decided on by the patient with goals and time frames  set for achieving them. Immunization and cancer screening needs are specifically addressed at this visit.   Rheumatoid arthritis (HCC) Current pain flare, short course of prednisone, continu management through Rheumatology  Right serous otitis media Short course of steroid anddecongestant

## 2022-03-25 NOTE — Patient Instructions (Signed)
F/U in 6 months, call if  you need me sooner  Short course of prednisone and chlorpheniramine are prescribed for allergies and joint pain  Need RSV and covid vaccines  Labs are excellent  It is important that you exercise regularly at least 30 minutes 5 times a week. If you develop chest pain, have severe difficulty breathing, or feel very tired, stop exercising immediately and seek medical attention    Blood pressure meds, albuterol and epi pen will all be refilled  Thanks for choosing [facility], we consider it a privelige to serve you.]\

## 2022-03-25 NOTE — Assessment & Plan Note (Signed)
Short course of steroid anddecongestant

## 2022-05-02 ENCOUNTER — Encounter: Payer: Self-pay | Admitting: Family Medicine

## 2022-05-22 ENCOUNTER — Ambulatory Visit (HOSPITAL_COMMUNITY)
Admission: RE | Admit: 2022-05-22 | Discharge: 2022-05-22 | Disposition: A | Discharge status: Home / Self Care | Payer: Medicare Other | Source: Ambulatory Visit | Attending: Hematology | Admitting: Hematology

## 2022-05-22 DIAGNOSIS — C8590 Non-Hodgkin lymphoma, unspecified, unspecified site: Secondary | ICD-10-CM | POA: Insufficient documentation

## 2022-05-22 DIAGNOSIS — Z1231 Encounter for screening mammogram for malignant neoplasm of breast: Secondary | ICD-10-CM | POA: Diagnosis not present

## 2022-05-23 ENCOUNTER — Encounter: Payer: Self-pay | Admitting: Family Medicine

## 2022-05-30 ENCOUNTER — Ambulatory Visit (INDEPENDENT_AMBULATORY_CARE_PROVIDER_SITE_OTHER): Payer: Medicare Other

## 2022-05-30 VITALS — BP 121/76 | HR 96 | Ht 67.0 in | Wt 196.2 lb

## 2022-05-30 DIAGNOSIS — Z0000 Encounter for general adult medical examination without abnormal findings: Secondary | ICD-10-CM

## 2022-05-30 NOTE — Patient Instructions (Signed)
[name] , Thank you for taking time to come for your Medicare Wellness Visit. I appreciate your ongoing commitment to your health goals. Please review the following plan we discussed and let me know if I can assist you in the future.   Screening recommendations/referrals: Colonoscopy: Completed Mammogram: Completed Bone Density: Completed Recommended yearly ophthalmology/optometry visit for glaucoma screening and checkup Recommended yearly dental visit for hygiene and checkup  Vaccinations: Influenza vaccine: Completed Pneumococcal vaccine: Completed Tdap vaccine: Completed Shingles vaccine: Get at your pharmacy    Advanced directives: copy requested  Conditions/risks identified: falls, hypertension  Next appointment: 1 year   Preventive Care 4 Years and Older, Female Preventive care refers to lifestyle choices and visits with your health care provider that can promote health and wellness. What does preventive care include? A yearly physical exam. This is also called an annual well check. Dental exams once or twice a year. Routine eye exams. Ask your health care provider how often you should have your eyes checked. Personal lifestyle choices, including: Daily care of your teeth and gums. Regular physical activity. Eating a healthy diet. Avoiding tobacco and drug use. Limiting alcohol use. Practicing safe sex. Taking low-dose aspirin every day. Taking vitamin and mineral supplements as recommended by your health care provider. What happens during an annual well check? The services and screenings done by your health care provider during your annual well check will depend on your age, overall health, lifestyle risk factors, and family history of disease. Counseling  Your health care provider may ask you questions about your: Alcohol use. Tobacco use. Drug use. Emotional well-being. Home and relationship well-being. Sexual activity. Eating habits. History of  falls. Memory and ability to understand (cognition). Work and work Statistician. Reproductive health. Screening  You may have the following tests or measurements: Height, weight, and BMI. Blood pressure. Lipid and cholesterol levels. These may be checked every 5 years, or more frequently if you are over 13 years old. Skin check. Lung cancer screening. You may have this screening every year starting at age 61 if you have a 30-pack-year history of smoking and currently smoke or have quit within the past 15 years. Fecal occult blood test (FOBT) of the stool. You may have this test every year starting at age 31. Flexible sigmoidoscopy or colonoscopy. You may have a sigmoidoscopy every 5 years or a colonoscopy every 10 years starting at age 93. Hepatitis C blood test. Hepatitis B blood test. Sexually transmitted disease (STD) testing. Diabetes screening. This is done by checking your blood sugar (glucose) after you have not eaten for a while (fasting). You may have this done every 1-3 years. Bone density scan. This is done to screen for osteoporosis. You may have this done starting at age 100. Mammogram. This may be done every 1-2 years. Talk to your health care provider about how often you should have regular mammograms. Talk with your health care provider about your test results, treatment options, and if necessary, the need for more tests. Vaccines  Your health care provider may recommend certain vaccines, such as: Influenza vaccine. This is recommended every year. Tetanus, diphtheria, and acellular pertussis (Tdap, Td) vaccine. You may need a Td booster every 10 years. Zoster vaccine. You may need this after age 24. Pneumococcal 13-valent conjugate (PCV13) vaccine. One dose is recommended after age 40. Pneumococcal polysaccharide (PPSV23) vaccine. One dose is recommended after age 41. Talk to your health care provider about which screenings and vaccines you need and how often you  need  them. This information is not intended to replace advice given to you by your health care provider. Make sure you discuss any questions you have with your health care provider. Document Released: 06/08/2015 Document Revised: 01/30/2016 Document Reviewed: 03/13/2015 Elsevier Interactive Patient Education  2017 [employer] Prevention in the Home Falls can cause injuries. They can happen to people of all ages. There are many things you can do to make your home safe and to help prevent falls. What can I do on the outside of my home? Regularly fix the edges of walkways and driveways and fix any cracks. Remove anything that might make you trip as you walk through a door, such as a raised step or threshold. Trim any bushes or trees on the path to your home. Use bright outdoor lighting. Clear any walking paths of anything that might make someone trip, such as rocks or tools. Regularly check to see if handrails are loose or broken. Make sure that both sides of any steps have handrails. Any raised decks and porches should have guardrails on the edges. Have any leaves, snow, or ice cleared regularly. Use sand or salt on walking paths during winter. Clean up any spills in your garage right away. This includes oil or grease spills. What can I do in the bathroom? Use night lights. Install grab bars by the toilet and in the tub and shower. Do not use towel bars as grab bars. Use non-skid mats or decals in the tub or shower. If you need to sit down in the shower, use a plastic, non-slip stool. Keep the floor dry. Clean up any water that spills on the floor as soon as it happens. Remove soap buildup in the tub or shower regularly. Attach bath mats securely with double-sided non-slip rug tape. Do not have throw rugs and other things on the floor that can make you trip. What can I do in the bedroom? Use night lights. Make sure that you have a light by your bed that is easy to reach. Do not use  any sheets or blankets that are too big for your bed. They should not hang down onto the floor. Have a firm chair that has side arms. You can use this for support while you get dressed. Do not have throw rugs and other things on the floor that can make you trip. What can I do in the kitchen? Clean up any spills right away. Avoid walking on wet floors. Keep items that you use a lot in easy-to-reach places. If you need to reach something above you, use a strong step stool that has a grab bar. Keep electrical cords out of the way. Do not use floor polish or wax that makes floors slippery. If you must use wax, use non-skid floor wax. Do not have throw rugs and other things on the floor that can make you trip. What can I do with my stairs? Do not leave any items on the stairs. Make sure that there are handrails on both sides of the stairs and use them. Fix handrails that are broken or loose. Make sure that handrails are as long as the stairways. Check any carpeting to make sure that it is firmly attached to the stairs. Fix any carpet that is loose or worn. Avoid having throw rugs at the top or bottom of the stairs. If you do have throw rugs, attach them to the floor with carpet tape. Make sure that you have a light switch  at the top of the stairs and the bottom of the stairs. If you do not have them, ask someone to add them for you. What else can I do to help prevent falls? Wear shoes that: Do not have high heels. Have rubber bottoms. Are comfortable and fit you well. Are closed at the toe. Do not wear sandals. If you use a stepladder: Make sure that it is fully opened. Do not climb a closed stepladder. Make sure that both sides of the stepladder are locked into place. Ask someone to hold it for you, if possible. Clearly mark and make sure that you can see: Any grab bars or handrails. First and last steps. Where the edge of each step is. Use tools that help you move around (mobility aids)  if they are needed. These include: Canes. Walkers. Scooters. Crutches. Turn on the lights when you go into a dark area. Replace any light bulbs as soon as they burn out. Set up your furniture so you have a clear path. Avoid moving your furniture around. If any of your floors are uneven, fix them. If there are any pets around you, be aware of where they are. Review your medicines with your doctor. Some medicines can make you feel dizzy. This can increase your chance of falling. Ask your doctor what other things that you can do to help prevent falls. This information is not intended to replace advice given to you by your health care provider. Make sure you discuss any questions you have with your health care provider. Document Released: 03/08/2009 Document Revised: 10/18/2015 Document Reviewed: 06/16/2014 Elsevier Interactive Patient Education  2017 [employer].

## 2022-05-30 NOTE — Progress Notes (Signed)
Subjective:   [name] is a 72 y.o. female who presents for Medicare Annual (Subsequent) preventive examination.  Review of Systems     [name] , Thank you for taking time to come for your Medicare Wellness Visit. I appreciate your ongoing commitment to your health goals. Please review the following plan we discussed and let me know if I can assist you in the future.   These are the goals we discussed:  Goals   None     This is a list of the screening recommended for you and due dates:  Health Maintenance  Topic Date Due   Zoster (Shingles) Vaccine (1 of 2) Never done   COVID-19 Vaccine (6 - 2023-24 season) 01/24/2022   Medicare Annual Wellness Visit  05/31/2023   DTaP/Tdap/Td vaccine (3 - Td or Tdap) 03/24/2024   Mammogram  05/22/2024   Colon Cancer Screening  05/30/2030   Pneumonia Vaccine  Completed   Flu Shot  Completed   DEXA scan (bone density measurement)  Completed   Hepatitis C Screening: USPSTF Recommendation to screen - Ages 63-79 yo.  Completed   HPV Vaccine  Aged Out          Objective:    There were no vitals filed for this visit. There is no height or weight on file to calculate BMI.     02/12/2022    3:11 PM 10/14/2021   11:46 AM 05/21/2021    3:44 PM 04/08/2021   10:44 AM 01/29/2021    3:37 PM 10/02/2020    3:21 PM 05/30/2020    9:42 AM  Advanced Directives  Does Patient Have a Medical Advance Directive? No Yes Yes Yes Yes Yes Yes  Type of Social research officer, government;Living will Healthcare Power of [place];Living will [place];Living will [place];Living will  Does patient want to make changes to medical advance directive?  No - Patient declined  No - Patient declined No - Patient declined No - Patient declined   Copy of [place] in Chart?  No - copy requested  No - copy requested No - copy requested No -  copy requested No - copy requested  Would patient like information on creating a medical advance directive? No - Patient declined No - Patient declined    No - Patient declined     Current Medications (verified) Outpatient Encounter Medications as of 05/30/2022  Medication Sig   acetaminophen (TYLENOL) 500 MG tablet Take 1,000 mg by mouth every 6 (six) hours as needed for moderate pain.   albuterol (VENTOLIN HFA) 108 (90 Base) MCG/ACT inhaler Inhale 2 puffs into the lungs every 6 (six) hours as needed for wheezing or shortness of breath.   ALPRAZolam (XANAX) 0.25 MG tablet Take one tablet by mouth once daily, as needed, for anxiety   aspirin 81 MG EC tablet Take 1 tablet (81 mg total) by mouth daily.   Calcium Carbonate-Vitamin D 600-400 MG-UNIT tablet Take 1 tablet by mouth 2 (two) times daily with a meal.     chlorpheniramine (CHLOR-TRIMETON) 4 MG tablet Take 1 tablet (4 mg total) by mouth 2 (two) times daily as needed for allergies.   Cholecalciferol (VITAMIN D3) 5000 units TABS Take 5,000 Units by mouth daily.   Coenzyme Q10 100 MG capsule Take 100 mg by mouth 3 (three) times daily.   EPINEPHrine 0.3 mg/0.3 mL IJ SOAJ injection Inject  0.3 mg into the muscle as needed for anaphylaxis.   EPINEPHrine 0.3 mg/0.3 mL IJ SOAJ injection Inject 0.3 mg into the muscle as needed for anaphylaxis.   fluticasone (FLONASE) 50 MCG/ACT nasal spray Place 2 sprays into both nostrils daily. (Patient taking differently: Place 2 sprays into both nostrils daily as needed for allergies.)   gabapentin (NEURONTIN) 100 MG capsule Take 1 capsule (100 mg total) by mouth at bedtime.   hydroxychloroquine (PLAQUENIL) 200 MG tablet Take 400 mg by mouth daily.    loratadine (CLARITIN) 10 MG tablet Take 10 mg by mouth daily.   losartan (COZAAR) 50 MG tablet Take 1 tablet (50 mg total) by mouth daily.   montelukast (SINGULAIR) 10 MG tablet TAKE 1 TABLET BY MOUTH AT BEDTIME   Multiple Vitamins-Minerals (CENTRUM SILVER PO) Take  1 tablet by mouth daily.   Omega-3 Fatty Acids (FISH OIL) 1000 MG CAPS Take 1 capsule (1,000 mg total) by mouth daily.   omeprazole (PRILOSEC) 20 MG capsule Take 20 mg by mouth at bedtime.   oxyCODONE (OXY IR/ROXICODONE) 5 MG immediate release tablet Take 1 tablet (5 mg total) by mouth every 4 (four) hours as needed for severe pain.   pravastatin (PRAVACHOL) 10 MG tablet TAKE 1 TABLET BY MOUTH ONCE DAILY (  STOP  TAKING  CRESTOR)   pravastatin (PRAVACHOL) 10 MG tablet Take 1 tablet (10 mg total) by mouth daily.   predniSONE (DELTASONE) 10 MG tablet Take 1 tablet (10 mg total) by mouth 2 (two) times daily with a meal.   Turmeric 500 MG CAPS Take 1 capsule by mouth.   vitamin B-12 (CYANOCOBALAMIN) 50 MCG tablet Take 50 mcg by mouth daily.   Zinc 50 MG TABS Take 50 mg by mouth daily.   No facility-administered encounter medications on file as of 05/30/2022.    Allergies (verified) Septra [sulfamethoxazole-trimethoprim], Barbiturates, Chlordiazepoxide, Hydrocodone-acetaminophen, Pentothal [thiopental], Phenytoin, Rofecoxib, Cefuroxime axetil, Other, and Wellbutrin [bupropion]   History: Past Medical History:  Diagnosis Date   Acute left-sided back pain with sciatica 11/27/2018   Allergy    Annual physical exam 04/12/2015   Arthritis    Phreesia 05/01/2020   Arthritis, rheumatoid ([place]) 1993   hx   Cancer ([place])    Phreesia 05/01/2020   Cataract    Collagen vascular disease (HCC)    RA   COPD (chronic obstructive pulmonary disease) (HCC)    GERD (gastroesophageal reflux disease)    Hyperlipidemia    Hypertension    Kidney stone 1999   Non Hodgkin's lymphoma ([place])    Obesity    Osteoporosis 2019   intolerant of fosamax due to jaw pain   Porphyria ([place])    hx    Past Surgical History:  Procedure Laterality Date   APPENDECTOMY  1983   BREAST BIOPSY Left    diagnosid with non hogdkins lymphoma   COLONOSCOPY WITH PROPOFOL N/A 05/30/2020   Procedure: COLONOSCOPY WITH PROPOFOL;   Surgeon: Rogene Houston, MD;  Location: [facility];  Service: Endoscopy;  Laterality: N/A;  10:30   LITHOTRIPSY  2000   rt renal stone    POLYPECTOMY  05/30/2020   Procedure: POLYPECTOMY;  Surgeon: Rogene Houston, MD;  Location: [facility];  Service: Endoscopy;;   Family History  Problem Relation Age of Onset   Arthritis Mother    Hypertension Mother    Hypertension Sister    Arthritis Sister    Hypertension Sister    Arthritis Sister    Ulcerative colitis  Sister    Hypertension Sister    Arthritis Sister    Heart disease Sister    Varicose Veins Sister    Stroke Maternal Grandmother    Arthritis Maternal Grandfather    Hearing loss Maternal Grandfather    Arthritis Paternal Grandmother    Stroke Paternal Grandfather    Hyperlipidemia Sister    Hypertension Sister    Social History   Socioeconomic History   Marital status: Single    Spouse name: Not on file   Number of children: 0   Years of education: 12+   Highest education level: Doctorate  Occupational History   Occupation: Retired; Teacher part time currently  Tobacco Use   Smoking status: Some Days    Packs/day: 0.25    Years: 15.00    Total pack years: 3.75    Types: Cigarettes    Last attempt to quit: 05/25/2020    Years since quitting: 2.0    Passive exposure: Current   Smokeless tobacco: Never   Tobacco comments:    Smoke when stressed  Vaping Use   Vaping Use: Never used  Substance and Sexual Activity   Alcohol use: No   Drug use: No   Sexual activity: Not Currently    Birth control/protection: Abstinence, Post-menopausal  Other Topics Concern   Not on file  Social History Narrative   Lives alone    Social Determinants of Health   Financial Resource Strain: Low Risk  (05/29/2022)   Overall Financial Resource Strain (CARDIA)    Difficulty of Paying Living Expenses: Not hard at all  Food Insecurity: No Food Insecurity (05/29/2022)   Hunger Vital Sign    Worried About Running Out of  Food in the Last Year: Never true    Ran Out of Food in the Last Year: Never true  Transportation Needs: No Transportation Needs (05/29/2022)   PRAPARE - Hydrologist (Medical): No    Lack of Transportation (Non-Medical): No  Physical Activity: Insufficiently Active (05/29/2022)   Exercise Vital Sign    Days of Exercise per Week: 1 day    Minutes of Exercise per Session: 20 min  Stress: No Stress Concern Present (05/29/2022)   [place]    Feeling of Stress : Not at all  Social Connections: Moderately Integrated (05/29/2022)   Social Connection and Isolation Panel [NHANES]    Frequency of Communication with Friends and Family: More than three times a week    Frequency of Social Gatherings with Friends and Family: More than three times a week    Attends Religious Services: More than 4 times per year    Active Member of [employer] or Organizations: Yes    Attends Music therapist: More than 4 times per year    Marital Status: Never married    Tobacco Counseling Ready to quit: Yes Counseling given: Not Answered Tobacco comments: Smoke when stressed   Clinical Intake:              How often do you need to have someone help you when you read instructions, pamphlets, or other written materials from your doctor or pharmacy?: 1 - Never  Diabetic? No         Activities of Daily Living    05/29/2022    3:56 PM 02/12/2022    3:10 PM  In your present state of health, do you have any difficulty performing the following activities:  Hearing? 0  0  Vision? 0 0  Difficulty concentrating or making decisions? 0 0  Walking or climbing stairs? 0 0  Dressing or bathing? 0 0  Doing errands, shopping? 0 0  Preparing Food and eating ? N N  Using the Toilet? N N  In the past six months, have you accidently leaked urine? Y N  Do you have problems with loss of bowel control? N N  Managing  your Medications? N N  Managing your Finances? N N  Housekeeping or managing your Housekeeping? N N    Patient Care Team: Fayrene Helper, MD as PCP - General Gavin Pound, MD as Consulting Physician (Rheumatology) Donetta Potts, RN as Oncology Nurse Navigator (Oncology) Derek Jack, MD as Medical Oncologist (Oncology)  Indicate any recent Medical Services you may have received from other than [facility] providers in the past year (date may be approximate).     Assessment:   This is a routine wellness examination for [facility]  Hearing/Vision screen No results found.  Dietary issues and exercise activities discussed:     Goals Addressed   None   Depression Screen    03/25/2022   10:17 AM 02/12/2022    3:07 PM 01/24/2022   10:06 AM 12/10/2021    8:46 AM 05/21/2021    3:45 PM 03/22/2021   10:01 AM 01/29/2021    3:13 PM  PHQ 2/9 Scores  PHQ - 2 Score 0 0 0 0 0 0 0  PHQ- 9 Score     0 0     Fall Risk    05/29/2022    3:56 PM 03/25/2022   10:17 AM 02/12/2022    3:10 PM 02/12/2022    3:01 PM 01/24/2022   10:06 AM  [place] in the past year? 1 0 0 0 0  Number falls in past yr: 0 0 0 0 0  Injury with Fall? 0 0 0 0 0  Risk for fall due to :  No Fall Risks No Fall Risks No Fall Risks No Fall Risks  Follow up  Falls evaluation completed  Falls evaluation completed;Education provided;Falls prevention discussed Falls evaluation completed    FALL RISK PREVENTION PERTAINING TO THE HOME:  Any stairs in or around the home? Yes  If so, are there any without handrails? No  Home free of loose throw rugs in walkways, pet beds, electrical cords, etc? Yes  Adequate lighting in your home to reduce risk of falls? Yes   ASSISTIVE DEVICES UTILIZED TO PREVENT FALLS:  Life alert? No  Use of a cane, walker or w/c? No  Grab bars in the bathroom? No  Shower chair or bench in shower? No  Elevated toilet seat or a handicapped toilet? No   TIMED UP AND GO:  Was the test  performed? Yes .  Length of time to ambulate 10 feet: 20 sec.   Gait steady and fast without use of assistive device  Cognitive Function:        05/21/2021    3:47 PM 05/02/2020   10:06 AM 05/02/2019    9:58 AM 04/28/2018    9:22 AM  6CIT Screen  What Year? 0 points 0 points 0 points 0 points  What month? 0 points 0 points 0 points 0 points  What time? 0 points 0 points 0 points 0 points  Count back from 20 0 points 0 points 0 points 0 points  Months in reverse 0 points 0 points 0 points 0 points  Repeat phrase 2 points 0 points 0 points 0 points  Total Score 2 points 0 points 0 points 0 points    Immunizations Immunization History  Administered Date(s) Administered   Fluad Quad(high Dose 65+) 03/07/2019, 02/14/2021, 03/22/2022   Influenza Inj Mdck Quad Pf 03/18/2017   Influenza Split 02/04/2020   Influenza Whole 06/16/2006, 05/17/2009   Influenza,inj,Quad PF,6+ Mos 02/12/2018, 02/04/2020   Influenza-Unspecified 02/12/2018   MODERNA COVID-19 SARS-COV-2 PEDS BIVALENT BOOSTER 6Y-11Y 02/28/2021   Moderna Sars-Covid-2 Vaccination 06/30/2019, 07/29/2019, 03/10/2020, 09/03/2020   Pneumococcal Conjugate-13 12/21/2017   Pneumococcal Polysaccharide-23 06/07/2019   Td 06/16/2006   Td (Adult), 2 Lf Tetanus Toxid, Preservative Free 06/16/2006   Tdap 03/24/2014   Zoster, Live 09/28/2014    TDAP status: Up to date  Flu Vaccine status: Up to date  Pneumococcal vaccine status: Up to date  Covid-19 vaccine status: Completed vaccines  Qualifies for Shingles Vaccine? Yes   Zostavax completed No   Shingrix Completed?: No.    Education has been provided regarding the importance of this vaccine. Patient has been advised to call insurance company to determine out of pocket expense if they have not yet received this vaccine. Advised may also receive vaccine at local pharmacy or Health Dept. Verbalized acceptance and understanding.  Screening Tests Health Maintenance  Topic Date Due    Zoster Vaccines- Shingrix (1 of 2) Never done   COVID-19 Vaccine (6 - 2023-24 season) 01/24/2022   Medicare Annual Wellness (AWV)  05/31/2023   DTaP/Tdap/Td (3 - Td or Tdap) 03/24/2024   MAMMOGRAM  05/22/2024   COLONOSCOPY (Pts 45-12yr Insurance coverage will need to be confirmed)  05/30/2030   Pneumonia Vaccine 70 Years old  Completed   INFLUENZA VACCINE  Completed   DEXA SCAN  Completed   Hepatitis C Screening  Completed   HPV VACCINES  Aged Out    Health Maintenance  Health Maintenance Due  Topic Date Due   Zoster Vaccines- Shingrix (1 of 2) Never done   COVID-19 Vaccine (6 - 2023-24 season) 01/24/2022    Colorectal cancer screening: Type of screening: Colonoscopy. Completed 2022. Repeat every 10 years  Mammogram status: Completed 05/22/2022. Repeat every year  Bone Density status: Completed 05/17/2021. Results reflect: Bone density results: OSTEOPOROSIS. Repeat every 3 years.  Lung Cancer Screening: (Low Dose CT Chest recommended if Age 70-80years, 30 pack-year currently smoking OR have quit w/in 15years.) does qualify.   Lung Cancer Screening Referral:   Additional Screening:  Hepatitis C Screening: does qualify; Completed 06/07/2019  Vision Screening: Recommended annual ophthalmology exams for early detection of glaucoma and other disorders of the eye. Is the patient up to date with their annual eye exam?  Yes  Who is the provider or what is the name of the office in which the patient attends annual eye exams? Dr GKaty Fitch If pt is not established with a provider, would they like to be referred to a provider to establish care? .   Dental Screening: Recommended annual dental exams for proper oral hygiene  Community Resource Referral / Chronic Care Management: CRR required this visit?  No   CCM required this visit?  No      Plan:     I have personally reviewed and noted the following in the patient's chart:   Medical and social history Use of alcohol,  tobacco or illicit drugs  Current medications and supplements including opioid prescriptions. Patient is currently taking opioid prescriptions. Information provided to patient regarding non-opioid alternatives. Patient advised  to discuss non-opioid treatment plan with their provider. Functional ability and status Nutritional status Physical activity Advanced directives List of other physicians Hospitalizations, surgeries, and ER visits in previous 12 months Vitals Screenings to include cognitive, depression, and falls Referrals and appointments  In addition, I have reviewed and discussed with patient certain preventive protocols, quality metrics, and best practice recommendations. A written personalized care plan for preventive services as well as general preventive health recommendations were provided to patient.     Orson Ape, [place]   05/30/2022   Nurse Notes:  Ms. Buell , Thank you for taking time to come for your Medicare Wellness Visit. I appreciate your ongoing commitment to your health goals. Please review the following plan we discussed and let me know if I can assist you in the future.   These are the goals we discussed:  Goals   None     This is a list of the screening recommended for you and due dates:  Health Maintenance  Topic Date Due   Zoster (Shingles) Vaccine (1 of 2) Never done   COVID-19 Vaccine (6 - 2023-24 season) 01/24/2022   Medicare Annual Wellness Visit  05/31/2023   DTaP/Tdap/Td vaccine (3 - Td or Tdap) 03/24/2024   Mammogram  05/22/2024   Colon Cancer Screening  05/30/2030   Pneumonia Vaccine  Completed   Flu Shot  Completed   DEXA scan (bone density measurement)  Completed   Hepatitis C Screening: USPSTF Recommendation to screen - Ages 60-79 yo.  Completed   HPV Vaccine  Aged Out

## 2022-06-10 ENCOUNTER — Encounter: Payer: Self-pay | Admitting: Family Medicine

## 2022-07-01 DIAGNOSIS — H209 Unspecified iridocyclitis: Secondary | ICD-10-CM | POA: Diagnosis not present

## 2022-07-01 DIAGNOSIS — M1991 Primary osteoarthritis, unspecified site: Secondary | ICD-10-CM | POA: Diagnosis not present

## 2022-07-01 DIAGNOSIS — Z79899 Other long term (current) drug therapy: Secondary | ICD-10-CM | POA: Diagnosis not present

## 2022-07-01 DIAGNOSIS — M0579 Rheumatoid arthritis with rheumatoid factor of multiple sites without organ or systems involvement: Secondary | ICD-10-CM | POA: Diagnosis not present

## 2022-08-26 DIAGNOSIS — M818 Other osteoporosis without current pathological fracture: Secondary | ICD-10-CM | POA: Diagnosis not present

## 2022-08-27 ENCOUNTER — Other Ambulatory Visit: Payer: Self-pay

## 2022-08-27 ENCOUNTER — Ambulatory Visit (INDEPENDENT_AMBULATORY_CARE_PROVIDER_SITE_OTHER): Payer: Medicare Other | Admitting: "Endocrinology

## 2022-08-27 ENCOUNTER — Encounter: Payer: Self-pay | Admitting: "Endocrinology

## 2022-08-27 ENCOUNTER — Telehealth: Payer: Self-pay | Admitting: "Endocrinology

## 2022-08-27 VITALS — BP 124/74 | HR 84 | Ht 67.0 in | Wt 195.8 lb

## 2022-08-27 DIAGNOSIS — M818 Other osteoporosis without current pathological fracture: Secondary | ICD-10-CM | POA: Diagnosis not present

## 2022-08-27 LAB — COMPREHENSIVE METABOLIC PANEL
ALT: 17 IU/L (ref 0–32)
AST: 20 IU/L (ref 0–40)
Albumin/Globulin Ratio: 1.4 (ref 1.2–2.2)
Albumin: 3.8 g/dL — ABNORMAL LOW (ref 3.9–4.9)
Alkaline Phosphatase: 80 IU/L (ref 44–121)
BUN/Creatinine Ratio: 16 (ref 12–28)
BUN: 15 mg/dL (ref 8–27)
Bilirubin Total: 0.5 mg/dL (ref 0.0–1.2)
CO2: 22 mmol/L (ref 20–29)
Calcium: 9.2 mg/dL (ref 8.7–10.3)
Chloride: 104 mmol/L (ref 96–106)
Creatinine, Ser: 0.95 mg/dL (ref 0.57–1.00)
Globulin, Total: 2.8 g/dL (ref 1.5–4.5)
Glucose: 84 mg/dL (ref 70–99)
Potassium: 4.4 mmol/L (ref 3.5–5.2)
Sodium: 141 mmol/L (ref 134–144)
Total Protein: 6.6 g/dL (ref 6.0–8.5)
eGFR: 65 mL/min/{1.73_m2} (ref >59)

## 2022-08-27 NOTE — Telephone Encounter (Signed)
Submitted documentation to [employer] and [employer] for PA if required. Turn around time is 72 hrs.

## 2022-08-27 NOTE — Telephone Encounter (Signed)
Pt states she would like to have infusion performed at [facility]. Advised her we are waiting on insurance to advise if she needs a PA. Pt voiced understanding.

## 2022-08-27 NOTE — Progress Notes (Signed)
08/27/2022     Endocrinology follow-up note     Past Medical History:  Diagnosis Date   Acute left-sided back pain with sciatica 11/27/2018   Allergy    Annual physical exam 04/12/2015   Arthritis    Phreesia 05/01/2020   Arthritis, rheumatoid 1993   hx   Cancer    Phreesia 05/01/2020   Cataract    Collagen vascular disease    RA   COPD (chronic obstructive pulmonary disease)    GERD (gastroesophageal reflux disease)    Hyperlipidemia    Hypertension    Kidney stone 1999   Non Hodgkin's lymphoma    Obesity    Osteoporosis 2019   intolerant of fosamax due to jaw pain   Porphyria    hx    Past Surgical History:  Procedure Laterality Date   APPENDECTOMY  1983   BREAST BIOPSY Left    diagnosid with non hogdkins lymphoma   COLONOSCOPY WITH PROPOFOL N/A 05/30/2020   Procedure: COLONOSCOPY WITH PROPOFOL;  Surgeon: Rogene Houston, MD;  Location: [facility];  Service: Endoscopy;  Laterality: N/A;  10:30   LITHOTRIPSY  2000   rt renal stone    POLYPECTOMY  05/30/2020   Procedure: POLYPECTOMY;  Surgeon: Rogene Houston, MD;  Location: [facility];  Service: Endoscopy;;   Social History   Socioeconomic History   Marital status: Single    Spouse name: Not on file   Number of children: 0   Years of education: 12+   Highest education level: Doctorate  Occupational History   Occupation: Retired; Teacher part time currently  Tobacco Use   Smoking status: Some Days    Packs/day: 0.25    Years: 15.00    Additional pack years: 0.00    Total pack years: 3.75    Types: Cigarettes    Last attempt to quit: 05/25/2020    Years since quitting: 2.2    Passive exposure: Current   Smokeless tobacco: Never   Tobacco comments:    Smoke when stressed  Vaping Use   Vaping Use: Never used  Substance and Sexual Activity   Alcohol use: No   Drug use: No   Sexual activity: Not Currently    Birth  control/protection: Abstinence, Post-menopausal  Other Topics Concern   Not on file  Social History Narrative   Lives alone    Social Determinants of Health   Financial Resource Strain: Low Risk  (05/30/2022)   Overall Financial Resource Strain (CARDIA)    Difficulty of Paying Living Expenses: Not very hard  Food Insecurity: No Food Insecurity (05/30/2022)   Hunger Vital Sign    Worried About Running Out of Food in the Last Year: Never true    Ran Out of Food in the Last Year: Never true  Transportation Needs: No Transportation Needs (05/30/2022)   PRAPARE - Hydrologist (Medical): No    Lack of Transportation (Non-Medical): No  Physical Activity: Insufficiently Active (05/30/2022)   Exercise Vital Sign    Days of Exercise per Week: 1 day    Minutes of Exercise per Session: 20 min  Stress: No Stress  Concern Present (05/30/2022)   [place]    Feeling of Stress : Not at all  Social Connections: Moderately Integrated (05/30/2022)   Social Connection and Isolation Panel [NHANES]    Frequency of Communication with Friends and Family: More than three times a week    Frequency of Social Gatherings with Friends and Family: More than three times a week    Attends Religious Services: More than 4 times per year    Active Member of [employer] or Organizations: Yes    Attends Archivist Meetings: More than 4 times per year    Marital Status: Never married   Outpatient Encounter Medications as of 08/27/2022  Medication Sig   Collagen Hydrolysate POWD by Does not apply route 2 (two) times daily.   acetaminophen (TYLENOL) 500 MG tablet Take 1,000 mg by mouth every 6 (six) hours as needed for moderate pain.   albuterol (VENTOLIN HFA) 108 (90 Base) MCG/ACT inhaler Inhale 2 puffs into the lungs every 6 (six) hours as needed for wheezing or shortness of breath.   ALPRAZolam (XANAX) 0.25 MG tablet Take one  tablet by mouth once daily, as needed, for anxiety   aspirin 81 MG EC tablet Take 1 tablet (81 mg total) by mouth daily.   Calcium Carbonate-Vitamin D 600-400 MG-UNIT tablet Take 1 tablet by mouth 2 (two) times daily with a meal.     chlorpheniramine (CHLOR-TRIMETON) 4 MG tablet Take 1 tablet (4 mg total) by mouth 2 (two) times daily as needed for allergies.   Cholecalciferol (VITAMIN D3) 5000 units TABS Take 5,000 Units by mouth daily.   Coenzyme Q10 100 MG capsule Take 100 mg by mouth 3 (three) times daily.   EPINEPHrine 0.3 mg/0.3 mL IJ SOAJ injection Inject 0.3 mg into the muscle as needed for anaphylaxis.   EPINEPHrine 0.3 mg/0.3 mL IJ SOAJ injection Inject 0.3 mg into the muscle as needed for anaphylaxis.   fluticasone (FLONASE) 50 MCG/ACT nasal spray Place 2 sprays into both nostrils daily. (Patient taking differently: Place 2 sprays into both nostrils daily as needed for allergies.)   gabapentin (NEURONTIN) 100 MG capsule Take 1 capsule (100 mg total) by mouth at bedtime.   hydroxychloroquine (PLAQUENIL) 200 MG tablet Take 400 mg by mouth daily.    loratadine (CLARITIN) 10 MG tablet Take 10 mg by mouth daily.   losartan (COZAAR) 50 MG tablet Take 1 tablet (50 mg total) by mouth daily.   montelukast (SINGULAIR) 10 MG tablet TAKE 1 TABLET BY MOUTH AT BEDTIME   Multiple Vitamins-Minerals (CENTRUM SILVER PO) Take 1 tablet by mouth daily.   Omega-3 Fatty Acids (FISH OIL) 1000 MG CAPS Take 1 capsule (1,000 mg total) by mouth daily.   omeprazole (PRILOSEC) 20 MG capsule Take 20 mg by mouth at bedtime.   pravastatin (PRAVACHOL) 10 MG tablet TAKE 1 TABLET BY MOUTH ONCE DAILY (  STOP  TAKING  CRESTOR)   pravastatin (PRAVACHOL) 10 MG tablet Take 1 tablet (10 mg total) by mouth daily.   Turmeric 500 MG CAPS Take 1 capsule by mouth.   vitamin B-12 (CYANOCOBALAMIN) 50 MCG tablet Take 50 mcg by mouth daily.   Zinc 50 MG TABS Take 50 mg by mouth daily.   [DISCONTINUED] oxyCODONE (OXY IR/ROXICODONE) 5  MG immediate release tablet Take 1 tablet (5 mg total) by mouth every 4 (four) hours as needed for severe pain.   [DISCONTINUED] predniSONE (DELTASONE) 10 MG tablet Take 1 tablet (10  mg total) by mouth 2 (two) times daily with a meal.   No facility-administered encounter medications on file as of 08/27/2022.   ALLERGIES: Allergies  Allergen Reactions   Septra [Sulfamethoxazole-Trimethoprim] Dermatitis   Barbiturates Other (See Comments)    Prophyria    Chlordiazepoxide Other (See Comments)    unknown   Hydrocodone-Acetaminophen Hives and Other (See Comments)   Pentothal [Thiopental]     Hives?   Phenytoin Other (See Comments)    Prophyria    Rofecoxib Other (See Comments)    Prophyria    Cefuroxime Axetil Rash   Other    Wellbutrin [Bupropion] Palpitations    VACCINATION STATUS: Immunization History  Administered Date(s) Administered   Fluad Quad(high Dose 65+) 03/07/2019, 02/14/2021, 03/22/2022   Influenza Inj Mdck Quad Pf 03/18/2017   Influenza Split 02/04/2020   Influenza Whole 06/16/2006, 05/17/2009   Influenza,inj,Quad PF,6+ Mos 02/12/2018, 02/04/2020   Influenza-Unspecified 02/12/2018   MODERNA COVID-19 SARS-COV-2 PEDS BIVALENT BOOSTER 6Y-11Y 02/28/2021   Moderna Sars-Covid-2 Vaccination 06/30/2019, 07/29/2019, 03/10/2020, 09/03/2020   Pneumococcal Conjugate-13 12/21/2017   Pneumococcal Polysaccharide-23 06/07/2019   Td 06/16/2006   Td (Adult), 2 Lf Tetanus Toxid, Preservative Free 06/16/2006   Tdap 03/24/2014   Zoster, Live 09/28/2014     HPI   [name] is 72 y.o. female who presents today with a medical history as above. she is being seen in follow-up after she was seen in consultation for  Osteoporosis requested by Fayrene Helper, MD.  Patient was diagnosed with osteoporosis  approximately  3 years ago. She denies fractures or falls. No dizziness/vertigo/orthostasis. She is status post Reclast infusion in April 2023.  She did report some discomfort  after injection, however generally tolerated the medication and she is due for her next infusion before the end of April 2024.    I reviewed pt's DEXA scans: from 2019 and 2022 she has a 28-30% bone loss , worse at spine. Her next DEXA scan is not due until December 2024. She previously took Fosamax 70 mg p.o. weekly, intermittently. She took 12/24 months roughly, prior to her last visit.  she is on regular calcium and vitamin D supplement.  Her previsit labs show calcium at 9.2, -25-hydroxy vitamin D at 80.2. She also eats dairy and green, leafy, vegetables.   No weight bearing exercises.   She does not take high vitamin A doses No h/o hyper/hypocalcemia. No h/o hyperparathyroidism. No h/o kidney stones.  Pt does have a FH of osteoporosis. In her mother. She denies height loss. She is a survivor of  NHL.    Review of Systems  Constitutional: no weight gain/loss, no fatigue, no subjective hyperthermia, no subjective hypothermia Eyes: no blurry vision, no xerophthalmia ENT: no sore throat, no nodules palpated in throat, no dysphagia/odynophagia, no hoarseness   Objective:    BP 124/74   Pulse 84   Ht 5\' 7"  (1.702 m)   Wt 195 lb 12.8 oz (88.8 kg)   BMI 30.67 kg/m   Wt Readings from Last 3 Encounters:  08/27/22 195 lb 12.8 oz (88.8 kg)  05/30/22 196 lb 3.2 oz (89 kg)  03/25/22 197 lb 1.9 oz (89.4 kg)    Physical Exam  Constitutional:  BMI of 31,  not in acute distress, normal state of mind Eyes: PERRLA, EOMI, no exophthalmos ENT: moist mucous membranes, no thyromegaly, no cervical lymphadenopathy   CMP ( most recent) CMP     Component Value Date/Time   NA 141 08/26/2022 0951  K 4.4 08/26/2022 0951   CL 104 08/26/2022 0951   CO2 22 08/26/2022 0951   GLUCOSE 84 08/26/2022 0951   GLUCOSE 95 10/07/2021 1002   BUN 15 08/26/2022 0951   CREATININE 0.95 08/26/2022 0951   CREATININE 0.85 11/17/2018 0743   CALCIUM 9.2 08/26/2022 0951   PROT 6.6 08/26/2022 0951    ALBUMIN 3.8 (L) 08/26/2022 0951   AST 20 08/26/2022 0951   ALT 17 08/26/2022 0951   ALKPHOS 80 08/26/2022 0951   BILITOT 0.5 08/26/2022 0951   GFRNONAA >60 10/07/2021 1002   GFRNONAA 72 11/17/2018 0743   GFRAA >60 10/05/2019 1427   GFRAA 83 11/17/2018 0743     Diabetic Labs (most recent): Lab Results  Component Value Date   HGBA1C 5.4 11/17/2018   HGBA1C 5.7 (H) 05/12/2017   HGBA1C 5.5 05/24/2016     Lipid Panel ( most recent) Lipid Panel     Component Value Date/Time   CHOL 169 03/24/2022 0808   TRIG 83 03/24/2022 0808   HDL 55 03/24/2022 0808   CHOLHDL 3.1 03/24/2022 0808   CHOLHDL 3.1 03/27/2020 1414   VLDL 22 03/27/2020 1414   LDLCALC 98 03/24/2022 0808   LDLCALC 122 (H) 11/17/2018 0743   LABVLDL 16 03/24/2022 0808      Lab Results  Component Value Date   TSH 0.739 03/24/2022   TSH 1.020 03/20/2021   TSH 0.90 05/10/2018   TSH 0.71 05/24/2016   TSH 0.803 04/11/2015   TSH 0.770 12/24/2011   TSH 1.766 12/19/2010   TSH 0.933 10/06/2007     DXA  : T- Score -3.0 spine, 04/2021, -2.9 in 03/2018 DXA : Femur -3.0 2022, -3.1 2019       Assessment: 1. Osteoporosis  Plan: 1. Osteoporosis - likely multifactorial vs postmenopausal  -Patient is status post Reclast infusion in April 2023. Her previsit labs show normalization of her calcium and vitamin D, advised to continue her calcium supplement  calcium carbonate 600-400 mg to twice a day with meals.  We discussed about increased risk of fracture, depending on the T score, greatly increased when the T score is lower than -2.5. -Her next bone density due December 2024.  - she wishes to avoid oral OP  medications, would like to continue on yearly infusion of Reclast.  Her next infusion will be in April 2024.   She has received her first Reclast infusion in April 2023, due for her next infusion before the end of April 2024.    - discussed fall precautions    She has normal renal function.  She will return in 8  months with repeat labs and repeat DEXA scan to assess renal function and calcium.   - I advised patient to maintain close follow up with Fayrene Helper, MD for primary care needs.   I spent  21  minutes in the care of the patient today including review of labs from Thyroid Function, CMP, and other relevant labs ; imaging/biopsy records (current and previous including abstractions from other facilities); face-to-face time discussing  her lab results and symptoms, medications doses, her options of short and long term treatment based on the latest standards of care / guidelines;   and documenting the encounter.  Sadeem Collaso Tillison  participated in the discussions, expressed understanding, and voiced agreement with the above plans.  All questions were answered to her satisfaction. she is encouraged to contact clinic should she have any questions or concerns prior to her return visit.  Follow up plan: Return in about 9 months (around 05/29/2023) for F/U with Pre-visit Labs, DXA Scan B4 NV.   Glade Lloyd, MD [facility] [address] [place], [place] [address] Phone: [phone]  Fax: [phone]     08/27/2022, 4:04 PM  This note was partially dictated with voice recognition software. Similar sounding words can be transcribed inadequately or may not  be corrected upon review.

## 2022-08-27 NOTE — Telephone Encounter (Signed)
Left a message requesting pt return call to the office. 

## 2022-08-27 NOTE — Telephone Encounter (Signed)
Pt stated [name] did not do reclast last time

## 2022-08-29 ENCOUNTER — Telehealth: Payer: Self-pay

## 2022-08-29 NOTE — Telephone Encounter (Signed)
Pharmacy Patient Advocate Encounter  Prior Authorization for Reclast has been approved by [employer]ins).    PA # [id number] RS Effective dates: 08/27/22 through 08/27/23

## 2022-09-10 ENCOUNTER — Other Ambulatory Visit: Payer: Self-pay

## 2022-09-12 ENCOUNTER — Telehealth: Payer: Self-pay | Admitting: Pharmacy Technician

## 2022-09-12 NOTE — Telephone Encounter (Signed)
Auth Submission: NO AUTH NEEDED Site of care: Site of care: [facility] Payer: [employer] MEDICARE Medication & CPT/J Code(s) submitted: Reclast (Zolendronic acid) [id number] Route of submission (phone, fax, portal):  Phone # Fax # Auth type: Buy/Bill Units/visits requested: 1 Reference number:  Approval from: 09/12/22 to 05/14/23

## 2022-09-22 ENCOUNTER — Encounter (HOSPITAL_COMMUNITY)
Admission: RE | Admit: 2022-09-22 | Discharge: 2022-09-22 | Disposition: A | Discharge status: Home / Self Care | Payer: Medicare Other | Source: Ambulatory Visit | Attending: "Endocrinology | Admitting: "Endocrinology

## 2022-09-22 VITALS — BP 110/67 | HR 75 | Temp 98.4°F | Resp 16

## 2022-09-22 DIAGNOSIS — M810 Age-related osteoporosis without current pathological fracture: Secondary | ICD-10-CM | POA: Diagnosis not present

## 2022-09-22 DIAGNOSIS — M818 Other osteoporosis without current pathological fracture: Secondary | ICD-10-CM

## 2022-09-22 MED ORDER — ZOLEDRONIC ACID 5 MG/100ML IV SOLN
5.0000 mg | Freq: Once | INTRAVENOUS | Status: AC
  Administered 2022-09-22: 5 mg via INTRAVENOUS

## 2022-09-22 NOTE — Progress Notes (Signed)
Diagnosis: Osteoporosis  Provider:  Danella Deis, MD  Procedure: IV Infusion  IV Type: Peripheral, IV Location: L Hand  Reclast (Zolendronic Acid), Dose: 5 mg  Infusion Start Time: 1312  Infusion Stop Time: 1341  Post Infusion IV Care: Peripheral IV Discontinued  Discharge: Condition: Good, Destination: Home . AVS Declined  Performed by:  Marlow Baars [place]kington-Burchett, RN

## 2022-09-22 NOTE — Addendum Note (Signed)
Encounter addended by: Cashis Rill E, RN on: 09/22/2022 3:53 PM  Actions taken: Therapy plan modified

## 2022-09-23 ENCOUNTER — Ambulatory Visit: Payer: Medicare Other | Admitting: Family Medicine

## 2022-09-23 DIAGNOSIS — H2513 Age-related nuclear cataract, bilateral: Secondary | ICD-10-CM | POA: Diagnosis not present

## 2022-09-23 DIAGNOSIS — H18413 Arcus senilis, bilateral: Secondary | ICD-10-CM | POA: Diagnosis not present

## 2022-09-23 DIAGNOSIS — M069 Rheumatoid arthritis, unspecified: Secondary | ICD-10-CM | POA: Diagnosis not present

## 2022-09-23 DIAGNOSIS — Z79899 Other long term (current) drug therapy: Secondary | ICD-10-CM | POA: Diagnosis not present

## 2022-09-23 DIAGNOSIS — H35372 Puckering of macula, left eye: Secondary | ICD-10-CM | POA: Diagnosis not present

## 2022-09-24 ENCOUNTER — Ambulatory Visit (INDEPENDENT_AMBULATORY_CARE_PROVIDER_SITE_OTHER): Payer: Medicare Other | Admitting: Family Medicine

## 2022-09-24 ENCOUNTER — Encounter: Payer: Self-pay | Admitting: Family Medicine

## 2022-09-24 VITALS — BP 124/82 | HR 99 | Ht 67.0 in | Wt 198.1 lb

## 2022-09-24 DIAGNOSIS — M05769 Rheumatoid arthritis with rheumatoid factor of unspecified knee without organ or systems involvement: Secondary | ICD-10-CM

## 2022-09-24 DIAGNOSIS — I10 Essential (primary) hypertension: Secondary | ICD-10-CM

## 2022-09-24 DIAGNOSIS — R7303 Prediabetes: Secondary | ICD-10-CM

## 2022-09-24 DIAGNOSIS — E785 Hyperlipidemia, unspecified: Secondary | ICD-10-CM

## 2022-09-24 DIAGNOSIS — E669 Obesity, unspecified: Secondary | ICD-10-CM

## 2022-09-24 DIAGNOSIS — J309 Allergic rhinitis, unspecified: Secondary | ICD-10-CM

## 2022-09-24 MED ORDER — PREDNISONE 5 MG PO TABS: 5.0000 mg | ORAL_TABLET | Freq: Two times a day (BID) | ORAL | 0 refills | Status: AC

## 2022-09-24 MED ORDER — LORATADINE 10 MG PO TABS: ORAL_TABLET | ORAL | 5 refills | Status: AC

## 2022-09-24 MED ORDER — MONTELUKAST SODIUM 10 MG PO TABS: 10.0000 mg | ORAL_TABLET | Freq: Every day | ORAL | 3 refills | Status: DC

## 2022-09-24 NOTE — Patient Instructions (Addendum)
Annual exam first week in Novemebr, call if you need me sooner ' Fasting lipid panel  with next lab draw  Congrats on improved health habits  It is important that you exercise regularly at least 30 minutes 5 times a week. If you develop chest pain, have severe difficulty breathing, or feel very tired, stop exercising immediately and seek medical attention     No med changes  For allergies increase claritin to  twice daily, continue daily singulair Short course of prednisone is prescribed   Thanks for choosing [facility], we consider it a privelige to serve you.

## 2022-09-25 ENCOUNTER — Encounter: Payer: Self-pay | Admitting: Family Medicine

## 2022-09-25 NOTE — Assessment & Plan Note (Signed)
Patient educated about the importance of limiting  Carbohydrate intake , the need to commit to daily physical activity for a minimum of 30 minutes , and to commit weight loss. The fact that changes in all these areas will reduce or eliminate all together the development of diabetes is stressed.      Latest Ref Rng & Units 08/26/2022    9:51 AM 03/24/2022    8:08 AM 01/24/2022   11:14 AM 10/07/2021   10:02 AM 04/04/2021    8:41 AM  Diabetic Labs  Chol 100 - 199 mg/dL  161      HDL >09 mg/dL  55      Calc LDL 0 - 99 mg/dL  98      Triglycerides 0 - 149 mg/dL  83      Creatinine 6.04 - 1.00 mg/dL 5.40  9.81  1.91  4.78  0.88       09/24/2022   10:46 AM 09/24/2022   10:00 AM 09/24/2022    9:59 AM 09/22/2022    1:41 PM 09/22/2022   12:55 PM 08/27/2022   11:18 AM 05/30/2022    1:17 PM  BP/Weight  Systolic BP 124 125 147 110 122 124 121  Diastolic BP 82 85 81 67 75 74 76  Wt. (Lbs)   198.12   195.8 196.2  BMI   31.03 kg/m2   30.67 kg/m2 30.73 kg/m2       No data to display          Updated lab needed at/ before next visit.

## 2022-09-25 NOTE — Assessment & Plan Note (Signed)
Uncontrolled , short course of prednisone prescribed 

## 2022-09-25 NOTE — Assessment & Plan Note (Signed)
Managed by Rheumatology , on methotrexate

## 2022-09-25 NOTE — Assessment & Plan Note (Signed)
  Patient re-educated about  the importance of commitment to a  minimum of 150 minutes of exercise per week as able.  The importance of healthy food choices with portion control discussed, as well as eating regularly and within a 12 hour window most days. The need to choose "clean , green" food 50 to 75% of the time is discussed, as well as to make water the primary drink and set a goal of 64 ounces water daily.       09/24/2022    9:59 AM 08/27/2022   11:18 AM 05/30/2022    1:17 PM  Weight /BMI  Weight 198 lb 1.9 oz 195 lb 12.8 oz 196 lb 3.2 oz  Height 5\' 7"  (1.702 m) 5\' 7"  (1.702 m) 5\' 7"  (1.702 m)  BMI 31.03 kg/m2 30.67 kg/m2 30.73 kg/m2

## 2022-09-25 NOTE — Assessment & Plan Note (Signed)
Hyperlipidemia:Low fat diet discussed and encouraged.   Lipid Panel  Lab Results  Component Value Date   CHOL 169 03/24/2022   HDL 55 03/24/2022   LDLCALC 98 03/24/2022   TRIG 83 03/24/2022   CHOLHDL 3.1 03/24/2022     Updated lab needed at/ before next visit.

## 2022-09-25 NOTE — Progress Notes (Signed)
[name]     MRN: [id number]      DOB: 1950-10-29  Chief Complaint  Patient presents with   Follow-up    Follow up allergies    HPI [name] is here for follow up and re-evaluation of chronic medical conditions, medication management and review of any available recent lab and radiology data.  Preventive health is updated, specifically  Cancer screening and Immunization.   Questions or concerns regarding consultations or procedures which the PT has had in the interim are  addressed. The PT denies any adverse reactions to current medications since the last visit.  2 week h/o increased and uncontrolled allergy symptoms   ROS Denies recent fever or chills. Denies sinus pressure, , ear pain or sore throat. Denies chest congestion, productive cough or wheezing. Denies chest pains, palpitations and leg swelling Denies abdominal pain, nausea, vomiting,diarrhea or constipation.   Denies dysuria, frequency, hesitancy or incontinence. Denies  uncontrolled joint pain, swelling and limitation in mobility. Denies headaches, seizures, numbness, or tingling. Denies depression, anxiety or insomnia. Denies skin break down or rash.   PE  BP 124/82   Pulse 99   Ht 5\' 7"  (1.702 m)   Wt 198 lb 1.9 oz (89.9 kg)   SpO2 96%   BMI 31.03 kg/m   Patient alert and oriented and in no cardiopulmonary distress.  HEENT: No facial asymmetry, EOMI,     Neck supple .Nasal congestion, no sinus tenderness, TM clear  Chest: Clear to auscultation bilaterally.  CVS: S1, S2 no murmurs, no S3.Regular rate.  ABD: Soft non tender.   Ext: No edema  MS: Adequate ROM spine, shoulders, hips and knees.  Skin: Intact, no ulcerations or rash noted.  Psych: Good eye contact, normal affect. Memory intact not anxious or depressed appearing.  CNS: CN 2-12 intact, power,  normal throughout.no focal deficits noted.   Assessment & Plan  Allergic rhinitis Uncontrolled, short course of prednisone  prescribed  Essential hypertension Controlled, no change in medication DASH diet and commitment to daily physical activity for a minimum of 30 minutes discussed and encouraged, as a part of hypertension management. The importance of attaining a healthy weight is also discussed.     09/24/2022   10:46 AM 09/24/2022   10:00 AM 09/24/2022    9:59 AM 09/22/2022    1:41 PM 09/22/2022   12:55 PM 08/27/2022   11:18 AM 05/30/2022    1:17 PM  BP/Weight  Systolic BP 124 125 147 110 122 124 121  Diastolic BP 82 85 81 67 75 74 76  Wt. (Lbs)   198.12   195.8 196.2  BMI   31.03 kg/m2   30.67 kg/m2 30.73 kg/m2       Hyperlipidemia LDL goal <100 Hyperlipidemia:Low fat diet discussed and encouraged.   Lipid Panel  Lab Results  Component Value Date   CHOL 169 03/24/2022   HDL 55 03/24/2022   LDLCALC 98 03/24/2022   TRIG 83 03/24/2022   CHOLHDL 3.1 03/24/2022     Updated lab needed at/ before next visit.   Obesity (BMI 30-39.9)  Patient re-educated about  the importance of commitment to a  minimum of 150 minutes of exercise per week as able.  The importance of healthy food choices with portion control discussed, as well as eating regularly and within a 12 hour window most days. The need to choose "clean , green" food 50 to 75% of the time is discussed, as well as to make water the  primary drink and set a goal of 64 ounces water daily.       09/24/2022    9:59 AM 08/27/2022   11:18 AM 05/30/2022    1:17 PM  Weight /BMI  Weight 198 lb 1.9 oz 195 lb 12.8 oz 196 lb 3.2 oz  Height 5\' 7"  (1.702 m) 5\' 7"  (1.702 m) 5\' 7"  (1.702 m)  BMI 31.03 kg/m2 30.67 kg/m2 30.73 kg/m2      Prediabetes Patient educated about the importance of limiting  Carbohydrate intake , the need to commit to daily physical activity for a minimum of 30 minutes , and to commit weight loss. The fact that changes in all these areas will reduce or eliminate all together the development of diabetes is stressed.      Latest  Ref Rng & Units 08/26/2022    9:51 AM 03/24/2022    8:08 AM 01/24/2022   11:14 AM 10/07/2021   10:02 AM 04/04/2021    8:41 AM  Diabetic Labs  Chol 100 - 199 mg/dL  161      HDL >09 mg/dL  55      Calc LDL 0 - 99 mg/dL  98      Triglycerides 0 - 149 mg/dL  83      Creatinine 6.04 - 1.00 mg/dL 5.40  9.81  1.91  4.78  0.88       09/24/2022   10:46 AM 09/24/2022   10:00 AM 09/24/2022    9:59 AM 09/22/2022    1:41 PM 09/22/2022   12:55 PM 08/27/2022   11:18 AM 05/30/2022    1:17 PM  BP/Weight  Systolic BP 124 125 147 110 122 124 121  Diastolic BP 82 85 81 67 75 74 76  Wt. (Lbs)   198.12   195.8 196.2  BMI   31.03 kg/m2   30.67 kg/m2 30.73 kg/m2       No data to display          Updated lab needed at/ before next visit.   Rheumatoid arthritis (HCC) Managed by Rheumatology , on methotrexate

## 2022-09-25 NOTE — Assessment & Plan Note (Signed)
Controlled, no change in medication DASH diet and commitment to daily physical activity for a minimum of 30 minutes discussed and encouraged, as a part of hypertension management. The importance of attaining a healthy weight is also discussed.     09/24/2022   10:46 AM 09/24/2022   10:00 AM 09/24/2022    9:59 AM 09/22/2022    1:41 PM 09/22/2022   12:55 PM 08/27/2022   11:18 AM 05/30/2022    1:17 PM  BP/Weight  Systolic BP 124 125 147 110 122 124 121  Diastolic BP 82 85 81 67 75 74 76  Wt. (Lbs)   198.12   195.8 196.2  BMI   31.03 kg/m2   30.67 kg/m2 30.73 kg/m2

## 2022-10-06 ENCOUNTER — Inpatient Hospital Stay: Payer: Medicare Other

## 2022-10-08 ENCOUNTER — Inpatient Hospital Stay: Payer: Medicare Other | Attending: Hematology

## 2022-10-08 DIAGNOSIS — C884 Extranodal marginal zone B-cell lymphoma of mucosa-associated lymphoid tissue [MALT-lymphoma]: Secondary | ICD-10-CM | POA: Diagnosis not present

## 2022-10-08 DIAGNOSIS — M069 Rheumatoid arthritis, unspecified: Secondary | ICD-10-CM | POA: Insufficient documentation

## 2022-10-08 DIAGNOSIS — E785 Hyperlipidemia, unspecified: Secondary | ICD-10-CM | POA: Diagnosis not present

## 2022-10-08 DIAGNOSIS — C8590 Non-Hodgkin lymphoma, unspecified, unspecified site: Secondary | ICD-10-CM

## 2022-10-08 LAB — CBC WITH DIFFERENTIAL/PLATELET
Abs Immature Granulocytes: 0.02 10*3/uL (ref 0.00–0.07)
Basophils Absolute: 0.1 10*3/uL (ref 0.0–0.1)
Basophils Relative: 1 %
Eosinophils Absolute: 0.2 10*3/uL (ref 0.0–0.5)
Eosinophils Relative: 2 %
HCT: 40.4 % (ref 36.0–46.0)
Hemoglobin: 13.1 g/dL (ref 12.0–15.0)
Immature Granulocytes: 0 %
Lymphocytes Relative: 31 %
Lymphs Abs: 2.5 10*3/uL (ref 0.7–4.0)
MCH: 27.9 pg (ref 26.0–34.0)
MCHC: 32.4 g/dL (ref 30.0–36.0)
MCV: 86 fL (ref 80.0–100.0)
Monocytes Absolute: 0.5 10*3/uL (ref 0.1–1.0)
Monocytes Relative: 6 %
Neutro Abs: 4.9 10*3/uL (ref 1.7–7.7)
Neutrophils Relative %: 60 %
Platelets: 274 10*3/uL (ref 150–400)
RBC: 4.7 MIL/uL (ref 3.87–5.11)
RDW: 15.1 % (ref 11.5–15.5)
WBC: 8.2 10*3/uL (ref 4.0–10.5)
nRBC: 0 % (ref 0.0–0.2)

## 2022-10-08 LAB — LIPID PANEL
Cholesterol: 191 mg/dL (ref 0–200)
HDL: 63 mg/dL (ref >40)
LDL Cholesterol: 111 mg/dL — ABNORMAL HIGH (ref 0–99)
Total CHOL/HDL Ratio: 3 RATIO
Triglycerides: 84 mg/dL (ref <150)
VLDL: 17 mg/dL (ref 0–40)

## 2022-10-08 LAB — COMPREHENSIVE METABOLIC PANEL
ALT: 16 U/L (ref 0–44)
AST: 19 U/L (ref 15–41)
Albumin: 3.4 g/dL — ABNORMAL LOW (ref 3.5–5.0)
Alkaline Phosphatase: 77 U/L (ref 38–126)
Anion gap: 8 (ref 5–15)
BUN: 12 mg/dL (ref 8–23)
CO2: 26 mmol/L (ref 22–32)
Calcium: 8.7 mg/dL — ABNORMAL LOW (ref 8.9–10.3)
Chloride: 105 mmol/L (ref 98–111)
Creatinine, Ser: 0.9 mg/dL (ref 0.44–1.00)
GFR, Estimated: >60 mL/min (ref >60)
Glucose, Bld: 97 mg/dL (ref 70–99)
Potassium: 3.8 mmol/L (ref 3.5–5.1)
Sodium: 139 mmol/L (ref 135–145)
Total Bilirubin: 0.5 mg/dL (ref 0.3–1.2)
Total Protein: 7.2 g/dL (ref 6.5–8.1)

## 2022-10-08 LAB — LACTATE DEHYDROGENASE: LDH: 134 U/L (ref 98–192)

## 2022-10-13 ENCOUNTER — Other Ambulatory Visit: Payer: Self-pay | Admitting: *Deleted

## 2022-10-13 ENCOUNTER — Inpatient Hospital Stay (HOSPITAL_BASED_OUTPATIENT_CLINIC_OR_DEPARTMENT_OTHER): Payer: Medicare Other | Admitting: Hematology

## 2022-10-13 VITALS — BP 135/89 | HR 90 | Temp 98.2°F | Resp 16 | Wt 198.9 lb

## 2022-10-13 DIAGNOSIS — Z1231 Encounter for screening mammogram for malignant neoplasm of breast: Secondary | ICD-10-CM | POA: Diagnosis not present

## 2022-10-13 DIAGNOSIS — C884 Extranodal marginal zone B-cell lymphoma of mucosa-associated lymphoid tissue [MALT-lymphoma]: Secondary | ICD-10-CM | POA: Diagnosis not present

## 2022-10-13 DIAGNOSIS — M069 Rheumatoid arthritis, unspecified: Secondary | ICD-10-CM | POA: Diagnosis not present

## 2022-10-13 DIAGNOSIS — E785 Hyperlipidemia, unspecified: Secondary | ICD-10-CM | POA: Diagnosis not present

## 2022-10-13 DIAGNOSIS — C8590 Non-Hodgkin lymphoma, unspecified, unspecified site: Secondary | ICD-10-CM

## 2022-10-13 NOTE — Progress Notes (Signed)
[name] [address] [place] [address]    Clinic Day:  10/13/2022  Referring physician: Kerri Perches, MD  Patient Care Team: Kerri Perches, MD as PCP - General Zenovia Jordan, MD as Consulting Physician (Rheumatology) Mickie Bail, RN as Oncology Nurse Navigator (Oncology) Doreatha Massed, MD as Medical Oncologist (Oncology)   ASSESSMENT & PLAN:   Assessment: 1.  Clinical stage III MALT lymphoma: -Mammogram on May 12, 2019 showed left breast mass. -Biopsy consistent with non-Hodgkin's lymphoma, specifically extranodal marginal zone lymphoma of MALT with plasmacytic differentiation. -She did not have any B symptoms. PET scan on 06/17/2019 showed mild hypermetabolic area in the left breast at the site of known lymphoma. Intense FDG uptake within the retrobulbar and periorbital soft tissues without signs of visible soft tissue mass or lesion. Mildly hypermetabolic lymph nodes in the external iliac chains bilaterally. Asymmetry of the tonsillar tissue with increased activity on the left side. MRI of the orbits did not show any mass. - XRT to the left breast mass end of March 2021. -PET CT scan on 03/06/2020 showed improvement at site of prior biopsy in the left upper breast, SUV 1.2, previously 4.4.  Stable mildly prominent bilateral external iliac lymph nodes with no new adenopathy.   2. Rheumatoid arthritis: -She was diagnosed in her 52s. Currently on Plaquenil.    Plan: 1.  Clinical stage III MALT lymphoma: - She denies any fevers, night sweats or weight loss. - No palpable adenopathy or splenomegaly today. - Denies any infections in the last 1 year. - Labs from 10/08/2022: Normal LFTs and creatinine.  CBC was normal.  LDH is normal. - Mammogram on 05/22/2022: BI-RADS Category 1. - RTC 1 year for follow-up.  We will plan to arrange mammogram in December.   2. Rheumatoid arthritis: - Continue Plaquenil.    Orders Placed This  Encounter  Procedures   MM 3D SCREENING MAMMOGRAM BILATERAL BREAST    Per office: No implants NAS No devices    Standing Status:   Future    Standing Expiration Date:   10/13/2023    Order Specific Question:   Reason for Exam (SYMPTOM  OR DIAGNOSIS REQUIRED)    Answer:   breast cancer screening    Order Specific Question:   Preferred imaging location?    Answer:   [facility]      I,Katie Daubenspeck,acting as a scribe for Doreatha Massed, MD.,have documented all relevant documentation on the behalf of Doreatha Massed, MD,as directed by  Doreatha Massed, MD while in the presence of Doreatha Massed, MD.   I, Doreatha Massed MD, have reviewed the above documentation for accuracy and completeness, and I agree with the above.   Doreatha Massed, MD   5/20/202412:27 PM  CHIEF COMPLAINT:   Diagnosis: MALT lymphoma    Cancer Staging  No matching staging information was found for the patient.   Prior Therapy: XRT to left breast, completed 07/2019  Current Therapy:  surveillance   HISTORY OF PRESENT ILLNESS:   Oncology History   No history exists.     INTERVAL HISTORY:   [name] is a 72 y.o. female presenting to clinic today for follow up of MALT lymphoma. She was last seen by me on 10/14/21.  Annual screening mammogram from 05/22/22 was negative.  Today, she states that she is doing well overall. Her appetite level is at 100%. Her energy level is at 100%.  PAST MEDICAL HISTORY:   Past Medical History:  Past Medical History:  Diagnosis Date   Acute left-sided back pain with sciatica 11/27/2018   Allergy    Annual physical exam 04/12/2015   Arthritis    Phreesia 05/01/2020   Arthritis, rheumatoid (HCC) 1993   hx   Cancer (HCC)    Phreesia 05/01/2020   Cataract    Collagen vascular disease (HCC)    RA   COPD (chronic obstructive pulmonary disease) (HCC)    GERD (gastroesophageal reflux disease)    Hyperlipidemia    Hypertension     Kidney stone 1999   Non Hodgkin's lymphoma (HCC)    Obesity    Osteoporosis 2019   intolerant of fosamax due to jaw pain   Porphyria (HCC)    hx     Surgical History: Past Surgical History:  Procedure Laterality Date   APPENDECTOMY  1983   BREAST BIOPSY Left    diagnosid with non hogdkins lymphoma   COLONOSCOPY WITH PROPOFOL N/A 05/30/2020   Procedure: COLONOSCOPY WITH PROPOFOL;  Surgeon: Malissa Hippo, MD;  [facility]: [facility];  Service: Endoscopy;  Laterality: N/A;  10:30   LITHOTRIPSY  2000   rt renal stone    POLYPECTOMY  05/30/2020   Procedure: POLYPECTOMY;  Surgeon: Malissa Hippo, MD;  Location: [facility];  Service: Endoscopy;;    Social History: Social History   Socioeconomic History   Marital status: Single    Spouse name: Not on file   Number of children: 0   Years of education: 12+   Highest education level: Doctorate  Occupational History   Occupation: Retired; Teacher part time currently  Tobacco Use   Smoking status: Some Days    Packs/day: 0.25    Years: 15.00    Additional pack years: 0.00    Total pack years: 3.75    Types: Cigarettes    Last attempt to quit: 05/25/2020    Years since quitting: 2.3    Passive exposure: Current   Smokeless tobacco: Never   Tobacco comments:    Smoke when stressed  Vaping Use   Vaping Use: Never used  Substance and Sexual Activity   Alcohol use: No   Drug use: No   Sexual activity: Not Currently    Birth control/protection: Abstinence, Post-menopausal  Other Topics Concern   Not on file  Social History Narrative   Lives alone    Social Determinants of Health   Financial Resource Strain: Low Risk  (09/22/2022)   Overall Financial Resource Strain (CARDIA)    Difficulty of Paying Living Expenses: Not hard at all  Food Insecurity: No Food Insecurity (09/22/2022)   Hunger Vital Sign    Worried About Running Out of Food in the Last Year: Never true    Ran Out of Food in the Last Year: Never true   Transportation Needs: No Transportation Needs (09/22/2022)   PRAPARE - Administrator, Civil Service (Medical): No    Lack of Transportation (Non-Medical): No  Physical Activity: Insufficiently Active (09/22/2022)   Exercise Vital Sign    Days of Exercise per Week: 1 day    Minutes of Exercise per Session: 10 min  Stress: No Stress Concern Present (09/22/2022)   [employer] of Occupational Health - Occupational Stress Questionnaire    Feeling of Stress : Not at all  Social Connections: Moderately Integrated (09/22/2022)   Social Connection and Isolation Panel [NHANES]    Frequency of Communication with Friends and Family: More than three times a week  Frequency of Social Gatherings with Friends and Family: Twice a week    Attends Religious Services: More than 4 times per year    Active Member of [employer] or Organizations: Yes    Attends Engineer, structural: More than 4 times per year    Marital Status: Never married  Intimate Partner Violence: Not At Risk (05/30/2022)   Humiliation, Afraid, Rape, and Kick questionnaire    Fear of Current or Ex-Partner: No    Emotionally Abused: No    Physically Abused: No    Sexually Abused: No    Family History: Family History  Problem Relation Age of Onset   Arthritis Mother    Hypertension Mother    Hypertension Sister    Arthritis Sister    Hypertension Sister    Arthritis Sister    Ulcerative colitis Sister    Hypertension Sister    Arthritis Sister    Heart disease Sister    Varicose Veins Sister    Stroke Maternal Grandmother    Arthritis Maternal Grandfather    Hearing loss Maternal Grandfather    Arthritis Paternal Grandmother    Stroke Paternal Grandfather    Hyperlipidemia Sister    Hypertension Sister     Current Medications:  Current Outpatient Medications:    acetaminophen (TYLENOL) 500 MG tablet, Take 1,000 mg by mouth every 6 (six) hours as needed for moderate pain., Disp: , Rfl:    albuterol  (VENTOLIN HFA) 108 (90 Base) MCG/ACT inhaler, Inhale 2 puffs into the lungs every 6 (six) hours as needed for wheezing or shortness of breath., Disp: 8 g, Rfl: 2   ALPRAZolam (XANAX) 0.25 MG tablet, Take one tablet by mouth once daily, as needed, for anxiety, Disp: 10 tablet, Rfl: 0   aspirin 81 MG EC tablet, Take 1 tablet (81 mg total) by mouth daily., Disp: 30 tablet, Rfl: 12   Calcium Carbonate-Vitamin D 600-400 MG-UNIT tablet, Take 1 tablet by mouth 2 (two) times daily with a meal.  , Disp: , Rfl:    chlorpheniramine (CHLOR-TRIMETON) 4 MG tablet, Take 1 tablet (4 mg total) by mouth 2 (two) times daily as needed for allergies., Disp: 14 tablet, Rfl: 0   Cholecalciferol (VITAMIN D3) 5000 units TABS, Take 5,000 Units by mouth daily., Disp: , Rfl:    Coenzyme Q10 100 MG capsule, Take 100 mg by mouth 3 (three) times daily., Disp: , Rfl:    Collagen Hydrolysate POWD, by Does not apply route 2 (two) times daily., Disp: , Rfl:    EPINEPHrine 0.3 mg/0.3 mL IJ SOAJ injection, Inject 0.3 mg into the muscle as needed for anaphylaxis., Disp: 1 each, Rfl: 2   EPINEPHrine 0.3 mg/0.3 mL IJ SOAJ injection, Inject 0.3 mg into the muscle as needed for anaphylaxis., Disp: 1 each, Rfl: 2   fluticasone (FLONASE) 50 MCG/ACT nasal spray, Place 2 sprays into both nostrils daily. (Patient taking differently: Place 2 sprays into both nostrils daily as needed for allergies.), Disp: 48 g, Rfl: 1   gabapentin (NEURONTIN) 100 MG capsule, Take 1 capsule (100 mg total) by mouth at bedtime., Disp: 90 capsule, Rfl: 3   hydroxychloroquine (PLAQUENIL) 200 MG tablet, Take 400 mg by mouth daily. , Disp: , Rfl:    loratadine (CLARITIN) 10 MG tablet, Take one tablet by mouth one to  two times daily u, Disp: 60 tablet, Rfl: 5   losartan (COZAAR) 50 MG tablet, Take 1 tablet (50 mg total) by mouth daily., Disp: 90 tablet, Rfl: 3  montelukast (SINGULAIR) 10 MG tablet, Take 1 tablet (10 mg total) by mouth at bedtime., Disp: 90 tablet, Rfl:  3   Multiple Vitamins-Minerals (CENTRUM SILVER PO), Take 1 tablet by mouth daily., Disp: , Rfl:    Omega-3 Fatty Acids (FISH OIL) 1000 MG CAPS, Take 1 capsule (1,000 mg total) by mouth daily., Disp: , Rfl: 0   omeprazole (PRILOSEC) 20 MG capsule, Take 20 mg by mouth at bedtime., Disp: , Rfl:    pravastatin (PRAVACHOL) 10 MG tablet, Take 1 tablet (10 mg total) by mouth daily., Disp: 90 tablet, Rfl: 3   Turmeric 500 MG CAPS, Take 1 capsule by mouth., Disp: , Rfl:    vitamin B-12 (CYANOCOBALAMIN) 50 MCG tablet, Take 50 mcg by mouth daily., Disp: , Rfl:    Zinc 50 MG TABS, Take 50 mg by mouth daily., Disp: , Rfl:    Allergies: Allergies  Allergen Reactions   Septra [Sulfamethoxazole-Trimethoprim] Dermatitis   Barbiturates Other (See Comments)    Prophyria    Chlordiazepoxide Other (See Comments)    unknown   Hydrocodone-Acetaminophen Hives and Other (See Comments)   Pentothal [Thiopental]     Hives?   Phenytoin Other (See Comments)    Prophyria    Rofecoxib Other (See Comments)    Prophyria    Cefuroxime Axetil Rash   Other    Wellbutrin [Bupropion] Palpitations    REVIEW OF SYSTEMS:   Review of Systems  Constitutional:  Negative for chills, fatigue and fever.  HENT:   Negative for lump/mass, mouth sores, nosebleeds, sore throat and trouble swallowing.   Eyes:  Negative for eye problems.  Respiratory:  Positive for cough. Negative for shortness of breath.   Cardiovascular:  Negative for chest pain, leg swelling and palpitations.  Gastrointestinal:  Negative for abdominal pain, constipation, diarrhea, nausea and vomiting.  Genitourinary:  Negative for bladder incontinence, difficulty urinating, dysuria, frequency, hematuria and nocturia.   Musculoskeletal:  Negative for arthralgias, back pain, flank pain, myalgias and neck pain.  Skin:  Negative for itching and rash.  Neurological:  Positive for numbness. Negative for dizziness and headaches.  Hematological:  Does not  bruise/bleed easily.  Psychiatric/Behavioral:  Negative for depression, sleep disturbance and suicidal ideas. The patient is not nervous/anxious.   All other systems reviewed and are negative.    VITALS:   Blood pressure 135/89, pulse 90, temperature 98.2 F (36.8 C), temperature source Oral, resp. rate 16, weight 198 lb 13.7 oz (90.2 kg), SpO2 99 %.  Wt Readings from Last 3 Encounters:  10/13/22 198 lb 13.7 oz (90.2 kg)  09/24/22 198 lb 1.9 oz (89.9 kg)  08/27/22 195 lb 12.8 oz (88.8 kg)    Body mass index is 31.15 kg/m.  Performance status (ECOG): 1 - Symptomatic but completely ambulatory  PHYSICAL EXAM:   Physical Exam Vitals and nursing note reviewed. Exam conducted with a chaperone present.  Constitutional:      Appearance: Normal appearance.  Cardiovascular:     Rate and Rhythm: Normal rate and regular rhythm.     Pulses: Normal pulses.     Heart sounds: Normal heart sounds.  Pulmonary:     Effort: Pulmonary effort is normal.     Breath sounds: Normal breath sounds.  Abdominal:     Palpations: Abdomen is soft. There is no hepatomegaly, splenomegaly or mass.     Tenderness: There is no abdominal tenderness.  Musculoskeletal:     Right lower leg: No edema.     Left lower leg: No  edema.  Lymphadenopathy:     Cervical: No cervical adenopathy.     Right cervical: No superficial, deep or posterior cervical adenopathy.    Left cervical: No superficial, deep or posterior cervical adenopathy.     Upper Body:     Right upper body: No supraclavicular or axillary adenopathy.     Left upper body: No supraclavicular or axillary adenopathy.  Neurological:     General: No focal deficit present.     Mental Status: She is alert and oriented to person, place, and time.  Psychiatric:        Mood and Affect: Mood normal.        Behavior: Behavior normal.     LABS:      Latest Ref Rng & Units 10/08/2022    7:59 AM 03/24/2022    8:08 AM 10/07/2021   10:02 AM  CBC  WBC 4.0 -  10.5 K/uL 8.2  11.4  8.6   Hemoglobin 12.0 - 15.0 g/dL 16.1  09.6  04.5   Hematocrit 36.0 - 46.0 % 40.4  40.8  38.7   Platelets 150 - 400 K/uL 274  249  269       Latest Ref Rng & Units 10/08/2022    7:59 AM 08/26/2022    9:51 AM 03/24/2022    8:08 AM  CMP  Glucose 70 - 99 mg/dL 97  84  78   BUN 8 - 23 mg/dL 12  15  17    Creatinine 0.44 - 1.00 mg/dL 4.09  8.11  9.14   Sodium 135 - 145 mmol/L 139  141  140   Potassium 3.5 - 5.1 mmol/L 3.8  4.4  4.2   Chloride 98 - 111 mmol/L 105  104  105   CO2 22 - 32 mmol/L 26  22  23    Calcium 8.9 - 10.3 mg/dL 8.7  9.2  8.8   Total Protein 6.5 - 8.1 g/dL 7.2  6.6  6.9   Total Bilirubin 0.3 - 1.2 mg/dL 0.5  0.5  0.3   Alkaline Phos 38 - 126 U/L 77  80  77   AST 15 - 41 U/L 19  20  17    ALT 0 - 44 U/L 16  17  15       No results found for: "CEA1", "CEA" / No results found for: "CEA1", "CEA" No results found for: "PSA1" No results found for: "NWG956" No results found for: "CAN125"  Lab Results  Component Value Date   TOTALPROTELP 7.7 06/07/2019   No results found for: "TIBC", "FERRITIN", "IRONPCTSAT" Lab Results  Component Value Date   LDH 134 10/08/2022   LDH 141 10/07/2021   LDH 128 04/04/2021     STUDIES:   No results found.

## 2022-10-13 NOTE — Patient Instructions (Signed)
[facility] at [facility] Discharge Instructions   You were seen and examined today by Dr. Ellin Saba.  He reviewed the results of your lab work which are normal.   We will see you back in one year. We will repeat a mammogram and blood work prior to your next visit.    Thank you for choosing [facility] at [facility] to provide your oncology and hematology care.  To afford each patient quality time with our provider, please arrive at least 15 minutes before your scheduled appointment time.   If you have a lab appointment with the Cancer Center please come in thru the Main Entrance and check in at the main information desk.  You need to re-schedule your appointment should you arrive 10 or more minutes late.  We strive to give you quality time with our providers, and arriving late affects you and other patients whose appointments are after yours.  Also, if you no show three or more times for appointments you may be dismissed from the clinic at the providers discretion.     Again, thank you for choosing [facility].  Our hope is that these requests will decrease the amount of time that you wait before being seen by our physicians.       _____________________________________________________________  Should you have questions after your visit to [facility], please contact our office at [phone] and follow the prompts.  Our office hours are 8:00 a.m. and 4:30 p.m. Monday - Friday.  Please note that voicemails left after 4:00 p.m. may not be returned until the following business day.  We are closed weekends and major holidays.  You do have access to a nurse 24-7, just call the main number to the clinic [phone] and do not press any options, hold on the line and a nurse will answer the phone.    For prescription refill requests, have your pharmacy contact our office and allow 72 hours.    Due to Covid, you will need to  wear a mask upon entering the hospital. If you do not have a mask, a mask will be given to you at the Main Entrance upon arrival. For doctor visits, patients may have 1 support person age 1 or older with them. For treatment visits, patients can not have anyone with them due to social distancing guidelines and our immunocompromised population.

## 2022-12-10 ENCOUNTER — Other Ambulatory Visit: Payer: Self-pay

## 2022-12-25 DIAGNOSIS — I739 Peripheral vascular disease, unspecified: Secondary | ICD-10-CM

## 2022-12-30 DIAGNOSIS — Z79899 Other long term (current) drug therapy: Secondary | ICD-10-CM | POA: Diagnosis not present

## 2022-12-30 DIAGNOSIS — M0579 Rheumatoid arthritis with rheumatoid factor of multiple sites without organ or systems involvement: Secondary | ICD-10-CM | POA: Diagnosis not present

## 2022-12-30 DIAGNOSIS — H209 Unspecified iridocyclitis: Secondary | ICD-10-CM | POA: Diagnosis not present

## 2022-12-30 DIAGNOSIS — M1991 Primary osteoarthritis, unspecified site: Secondary | ICD-10-CM | POA: Diagnosis not present

## 2022-12-30 DIAGNOSIS — M25512 Pain in left shoulder: Secondary | ICD-10-CM | POA: Diagnosis not present

## 2023-01-05 ENCOUNTER — Encounter: Payer: Self-pay | Admitting: Family Medicine

## 2023-01-13 ENCOUNTER — Ambulatory Visit: Payer: Medicare Other | Admitting: Cardiovascular Disease

## 2023-02-03 ENCOUNTER — Ambulatory Visit: Payer: Medicare Other | Attending: Cardiovascular Disease | Admitting: Cardiovascular Disease

## 2023-02-03 ENCOUNTER — Encounter: Payer: Self-pay | Admitting: Cardiovascular Disease

## 2023-02-03 VITALS — BP 130/82 | Ht 67.0 in | Wt 203.8 lb

## 2023-02-03 DIAGNOSIS — E785 Hyperlipidemia, unspecified: Secondary | ICD-10-CM | POA: Diagnosis not present

## 2023-02-03 DIAGNOSIS — Z720 Tobacco use: Secondary | ICD-10-CM

## 2023-02-03 DIAGNOSIS — I10 Essential (primary) hypertension: Secondary | ICD-10-CM | POA: Diagnosis not present

## 2023-02-03 DIAGNOSIS — I739 Peripheral vascular disease, unspecified: Secondary | ICD-10-CM | POA: Diagnosis not present

## 2023-02-03 NOTE — Patient Instructions (Signed)
Medication Instructions:  No changes *If you need a refill on your cardiac medications before your next appointment, please call your pharmacy*   Lab Work: None ordered If you have labs (blood work) drawn today and your tests are completely normal, you will receive your results only by: MyChart Message (if you have MyChart) OR A paper copy in the mail If you have any lab test that is abnormal or we need to change your treatment, we will call you to review the results.   Testing/Procedures: Your physician has requested that you have a lower extremity segmental doppler. This will take place at [address], [address].    Follow-Up: At [facility], you and your health needs are our priority.  As part of our continuing mission to provide you with exceptional heart care, we have created designated Provider Care Teams.  These Care Teams include your primary Cardiologist (physician) and Advanced Practice Providers (APPs -  Physician Assistants and Nurse Practitioners) who all work together to provide you with the care you need, when you need it.  We recommend signing up for the patient portal called "MyChart".  Sign up information is provided on this After Visit Summary.  MyChart is used to connect with patients for Virtual Visits (Telemedicine).  Patients are able to view lab/test results, encounter notes, upcoming appointments, etc.  Non-urgent messages can be sent to your provider as well.   To learn more about what you can do with MyChart, go to [email or web address].    Your next appointment:   As needed with Dr. Kirke Corin pending results  Other Instructions Managing the Challenge of Quitting Smoking Quitting smoking is a physical and mental challenge. You may have cravings, withdrawal symptoms, and temptation to smoke. Before quitting, work with your health care provider to make a plan that can help you manage quitting. Making a plan before you quit may keep you from smoking  when you have the urge to smoke while trying to quit. How to manage lifestyle changes Managing stress Stress can make you want to smoke, and wanting to smoke may cause stress. It is important to find ways to manage your stress. You could try some of the following: Practice relaxation techniques. Breathe slowly and deeply, in through your nose and out through your mouth. Listen to music. Soak in a bath or take a shower. Imagine a peaceful place or vacation. Get some support. Talk with family or friends about your stress. Join a support group. Talk with a counselor or therapist. Get some physical activity. Go for a walk, run, or bike ride. Play a favorite sport. Practice yoga.  Medicines Talk with your health care provider about medicines that might help you deal with cravings and make quitting easier for you. Relationships Social situations can be difficult when you are quitting smoking. To manage this, you can: Avoid parties and other social situations where people might be smoking. Avoid alcohol. Leave right away if you have the urge to smoke. Explain to your family and friends that you are quitting smoking. Ask for support and let them know you might be a bit grumpy. Plan activities where smoking is not an option. General instructions Be aware that many people gain weight after they quit smoking. However, not everyone does. To keep from gaining weight, have a plan in place before you quit, and stick to the plan after you quit. Your plan should include: Eating healthy snacks. When you have a craving, it may help to: Eat  popcorn, or try carrots, celery, or other cut vegetables. Chew sugar-free gum. Changing how you eat. Eat small portion sizes at meals. Eat 4-6 small meals throughout the day instead of 1-2 large meals a day. Be mindful when you eat. You should avoid watching television or doing other things that might distract you as you eat. Exercising regularly. Make time to  exercise each day. If you do not have time for a long workout, do short bouts of exercise for 5-10 minutes several times a day. Do some form of strengthening exercise, such as weight lifting. Do some exercise that gets your heart beating and causes you to breathe deeply, such as walking fast, running, swimming, or biking. This is very important. Drinking plenty of water or other low-calorie or no-calorie drinks. Drink enough fluid to keep your urine pale yellow.  How to recognize withdrawal symptoms Your body and mind may experience discomfort as you try to get used to not having nicotine in your system. These effects are called withdrawal symptoms. They may include: Feeling hungrier than normal. Having trouble concentrating. Feeling irritable or restless. Having trouble sleeping. Feeling depressed. Craving a cigarette. These symptoms may surprise you, but they are normal to have when quitting smoking. To manage withdrawal symptoms: Avoid places, people, and activities that trigger your cravings. Remember why you want to quit. Get plenty of sleep. Avoid coffee and other drinks that contain caffeine. These may worsen some of your symptoms. How to manage cravings Come up with a plan for how to deal with your cravings. The plan should include the following: A definition of the specific situation you want to deal with. An activity or action you will take to replace smoking. A clear idea for how this action will help. The name of someone who could help you with this. Cravings usually last for 5-10 minutes. Consider taking the following actions to help you with your plan to deal with cravings: Keep your mouth busy. Chew sugar-free gum. Suck on hard candies or a straw. Brush your teeth. Keep your hands and body busy. Change to a different activity right away. Squeeze or play with a ball. Do an activity or a hobby, such as making bead jewelry, practicing needlepoint, or working with  wood. Mix up your normal routine. Take a short exercise break. Go for a quick walk, or run up and down stairs. Focus on doing something kind or helpful for someone else. Call a friend or family member to talk during a craving. Join a support group. Contact a quitline. Where to find support To get help or find a support group: Call the National Cancer Institute's Smoking Quitline: 1-800-QUIT-NOW 319-401-5783) Text QUIT to SmokefreeTXT: 454098 Where to find more information Visit these websites to find more information on quitting smoking: U.S. Department of Health and Human Services: www.smokefree.gov American Lung Association: www.freedomfromsmoking.org Centers for Disease Control and Prevention (CDC): FootballExhibition.com.br American Heart Association: www.heart.org Contact a health care provider if: You want to change your plan for quitting. The medicines you are taking are not helping. Your eating feels out of control or you cannot sleep. You feel depressed or become very anxious. Summary Quitting smoking is a physical and mental challenge. You will face cravings, withdrawal symptoms, and temptation to smoke again. Preparation can help you as you go through these challenges. Try different techniques to manage stress, handle social situations, and prevent weight gain. You can deal with cravings by keeping your mouth busy (such as by chewing gum), keeping your hands  and body busy, calling family or friends, or contacting a quitline for people who want to quit smoking. You can deal with withdrawal symptoms by avoiding places where people smoke, getting plenty of rest, and avoiding drinks that contain caffeine. This information is not intended to replace advice given to you by your health care provider. Make sure you discuss any questions you have with your health care provider. Document Revised: 05/03/2021 Document Reviewed: 05/03/2021 Elsevier Patient Education  2024 [employer].

## 2023-02-03 NOTE — Progress Notes (Signed)
Cardiology Office Note   Date:  02/03/2023   ID:  [name], [name] Oct 21, 1950, MRN [id number]  PCP:  Kerri Perches, MD  Cardiologist:   Lorine Bears, MD   No chief complaint on file.     History of Present Illness: [name] is a 72 y.o. female who was referred by Dr. Lodema Hong for evaluation and management of peripheral arterial disease. She has known history of rheumatoid arthritis, essential hypertension, hyperlipidemia, tobacco use and non-Hodgkin's lymphoma.  She was treated for non-Hodgkin's lymphoma in 2021 with radiation therapy and was cured.  She is not diabetic.  She reports chronic cramping in both feet especially on the left side due to an abnormal foot arch.  She underwent screening ABI in July which was highly abnormal at 0.21 on the left and 0.89 on the right.  She denies calf or thigh claudication.  No chest pain or shortness of breath.  No lower extremity ulceration.   Past Medical History:  Diagnosis Date   Acute left-sided back pain with sciatica 11/27/2018   Allergy    Annual physical exam 04/12/2015   Arthritis    Phreesia 05/01/2020   Arthritis, rheumatoid (HCC) 1993   hx   Cancer (HCC)    Phreesia 05/01/2020   Cataract    Collagen vascular disease (HCC)    RA   COPD (chronic obstructive pulmonary disease) (HCC)    GERD (gastroesophageal reflux disease)    Hyperlipidemia    Hypertension    Kidney stone 1999   Non Hodgkin's lymphoma (HCC)    Obesity    Osteoporosis 2019   intolerant of fosamax due to jaw pain   Porphyria (HCC)    hx     Past Surgical History:  Procedure Laterality Date   APPENDECTOMY  1983   BREAST BIOPSY Left    diagnosid with non hogdkins lymphoma   COLONOSCOPY WITH PROPOFOL N/A 05/30/2020   Procedure: COLONOSCOPY WITH PROPOFOL;  Surgeon: Malissa Hippo, MD;  Location: [facility];  Service: Endoscopy;  Laterality: N/A;  10:30   LITHOTRIPSY  2000   rt renal stone    POLYPECTOMY  05/30/2020    Procedure: POLYPECTOMY;  Surgeon: Malissa Hippo, MD;  Location: [facility];  Service: Endoscopy;;     Current Outpatient Medications  Medication Sig Dispense Refill   acetaminophen (TYLENOL) 500 MG tablet Take 1,000 mg by mouth every 6 (six) hours as needed for moderate pain.     albuterol (VENTOLIN HFA) 108 (90 Base) MCG/ACT inhaler Inhale 2 puffs into the lungs every 6 (six) hours as needed for wheezing or shortness of breath. 8 g 2   ALPRAZolam (XANAX) 0.25 MG tablet Take one tablet by mouth once daily, as needed, for anxiety 10 tablet 0   aspirin 81 MG EC tablet Take 1 tablet (81 mg total) by mouth daily. 30 tablet 12   Calcium Carbonate-Vitamin D 600-400 MG-UNIT tablet Take 1 tablet by mouth 2 (two) times daily with a meal.       chlorpheniramine (CHLOR-TRIMETON) 4 MG tablet Take 1 tablet (4 mg total) by mouth 2 (two) times daily as needed for allergies. 14 tablet 0   Cholecalciferol (VITAMIN D3) 5000 units TABS Take 5,000 Units by mouth daily.     Coenzyme Q10 100 MG capsule Take 100 mg by mouth 3 (three) times daily.     Collagen Hydrolysate POWD by Does not apply route 2 (two) times daily.     EPINEPHrine  0.3 mg/0.3 mL IJ SOAJ injection Inject 0.3 mg into the muscle as needed for anaphylaxis. 1 each 2   EPINEPHrine 0.3 mg/0.3 mL IJ SOAJ injection Inject 0.3 mg into the muscle as needed for anaphylaxis. 1 each 2   fluticasone (FLONASE) 50 MCG/ACT nasal spray Place 2 sprays into both nostrils daily. (Patient taking differently: Place 2 sprays into both nostrils daily as needed for allergies.) 48 g 1   gabapentin (NEURONTIN) 100 MG capsule Take 1 capsule (100 mg total) by mouth at bedtime. 90 capsule 3   hydroxychloroquine (PLAQUENIL) 200 MG tablet Take 400 mg by mouth daily.      loratadine (CLARITIN) 10 MG tablet Take one tablet by mouth one to  two times daily u 60 tablet 5   losartan (COZAAR) 50 MG tablet Take 1 tablet (50 mg total) by mouth daily. 90 tablet 3   montelukast  (SINGULAIR) 10 MG tablet Take 1 tablet (10 mg total) by mouth at bedtime. 90 tablet 3   Multiple Vitamins-Minerals (CENTRUM SILVER PO) Take 1 tablet by mouth daily.     Omega-3 Fatty Acids (FISH OIL) 1000 MG CAPS Take 1 capsule (1,000 mg total) by mouth daily.  0   omeprazole (PRILOSEC) 20 MG capsule Take 20 mg by mouth at bedtime.     pravastatin (PRAVACHOL) 10 MG tablet Take 1 tablet (10 mg total) by mouth daily. 90 tablet 3   Turmeric 500 MG CAPS Take 1 capsule by mouth.     vitamin B-12 (CYANOCOBALAMIN) 50 MCG tablet Take 50 mcg by mouth daily.     Zinc 50 MG TABS Take 50 mg by mouth daily.     No current facility-administered medications for this visit.    Allergies:   Septra [sulfamethoxazole-trimethoprim], Barbiturates, Chlordiazepoxide, Hydrocodone-acetaminophen, Pentothal [thiopental], Phenytoin, Rofecoxib, Cefuroxime axetil, Other, and Wellbutrin [bupropion]    Social History:  The patient  reports that she has been smoking cigarettes. She started smoking about 17 years ago. She has a 3.8 pack-year smoking history. She has been exposed to tobacco smoke. She has never used smokeless tobacco. She reports that she does not drink alcohol and does not use drugs.   Family History:  The patient's family history includes Arthritis in her maternal grandfather, mother, paternal grandmother, sister, sister, and sister; Hearing loss in her maternal grandfather; Heart disease in her sister; Hyperlipidemia in her sister; Hypertension in her mother, sister, sister, sister, and sister; Stroke in her maternal grandmother and paternal grandfather; Ulcerative colitis in her sister; Varicose Veins in her sister.    ROS:  Please see the history of present illness.   Otherwise, review of systems are positive for none.   All other systems are reviewed and negative.    PHYSICAL EXAM: VS:  BP 130/82 (BP Location: Right Arm, Patient Position: Sitting, Cuff Size: Normal)   Ht 5\' 7"  (1.702 m)   Wt 203 lb  12.8 oz (92.4 kg)   BMI 31.92 kg/m  , BMI Body mass index is 31.92 kg/m. GEN: Well nourished, well developed, in no acute distress  HEENT: normal  Neck: no JVD, carotid bruits, or masses Cardiac: RRR; no , rubs, or gallops,no edema .  1 out of 6 systolic murmur in the aortic area. Respiratory:  clear to auscultation bilaterally, normal work of breathing GI: soft, nontender, nondistended, + BS MS: no deformity or atrophy  Skin: warm and dry, no rash Neuro:  Strength and sensation are intact Psych: euthymic mood, full affect Vascular: Femoral pulses normal  bilaterally.  Posterior tibial and dorsalis pedis is palpable bilaterally.   EKG:  EKG is ordered today. The ekg ordered today demonstrates : Normal sinus rhythm Normal ECG       Recent Labs: 03/24/2022: TSH 0.739 10/08/2022: ALT 16; BUN 12; Creatinine, Ser 0.90; Hemoglobin 13.1; Platelets 274; Potassium 3.8; Sodium 139    Lipid Panel    Component Value Date/Time   CHOL 191 10/08/2022 0804   CHOL 169 03/24/2022 0808   TRIG 84 10/08/2022 0804   HDL 63 10/08/2022 0804   HDL 55 03/24/2022 0808   CHOLHDL 3.0 10/08/2022 0804   VLDL 17 10/08/2022 0804   LDLCALC 111 (H) 10/08/2022 0804   LDLCALC 98 03/24/2022 0808   LDLCALC 122 (H) 11/17/2018 0743      Wt Readings from Last 3 Encounters:  02/03/23 203 lb 12.8 oz (92.4 kg)  10/13/22 198 lb 13.7 oz (90.2 kg)  09/24/22 198 lb 1.9 oz (89.9 kg)            No data to display            ASSESSMENT AND PLAN:  1.  Possible peripheral arterial disease: Screening ABI was highly abnormal especially on the left side at 0.21.  However, I do not think that is an accurate value.  She does have palpable distal pulses which means that her ABIs is at least 0.8.  In addition, she has no convincing symptoms of claudication and has no lower extremity ulceration.  I requested lower extremity arterial segmental Doppler.  2.  Tobacco use: I discussed with her the importance of  smoking cessation.  3.  Essential hypertension: Blood pressure is controlled.  4.  Hyperlipidemia: Currently on pravastatin 10 mg daily.    Disposition:   FU with as needed if arterial Doppler is abnormal.  Signed,  Lorine Bears, MD  02/03/2023 9:07 AM    [facility]

## 2023-02-06 ENCOUNTER — Ambulatory Visit (HOSPITAL_COMMUNITY)
Admission: RE | Admit: 2023-02-06 | Discharge: 2023-02-06 | Disposition: A | Discharge status: Home / Self Care | Payer: Medicare Other | Source: Ambulatory Visit | Attending: Cardiology | Admitting: Cardiology

## 2023-02-06 DIAGNOSIS — I739 Peripheral vascular disease, unspecified: Secondary | ICD-10-CM

## 2023-02-08 LAB — VAS US LOWER EXT ART SEG MULTI (SEGMENTALS & LE RAYNAUDS)
Left ABI: 1.21
Right ABI: 1.25

## 2023-03-20 ENCOUNTER — Encounter: Payer: Self-pay | Admitting: Family Medicine

## 2023-03-21 ENCOUNTER — Other Ambulatory Visit: Payer: Self-pay | Admitting: Family Medicine

## 2023-03-22 ENCOUNTER — Other Ambulatory Visit: Payer: Self-pay | Admitting: Family Medicine

## 2023-03-22 DIAGNOSIS — R7302 Impaired glucose tolerance (oral): Secondary | ICD-10-CM

## 2023-03-22 DIAGNOSIS — E559 Vitamin D deficiency, unspecified: Secondary | ICD-10-CM

## 2023-03-22 DIAGNOSIS — E66811 Obesity, class 1: Secondary | ICD-10-CM

## 2023-03-22 DIAGNOSIS — E7849 Other hyperlipidemia: Secondary | ICD-10-CM

## 2023-03-22 DIAGNOSIS — I10 Essential (primary) hypertension: Secondary | ICD-10-CM

## 2023-03-22 MED ORDER — LOSARTAN POTASSIUM 50 MG PO TABS: 50.0000 mg | ORAL_TABLET | Freq: Every day | ORAL | 3 refills | Status: DC

## 2023-03-27 DIAGNOSIS — E559 Vitamin D deficiency, unspecified: Secondary | ICD-10-CM | POA: Diagnosis not present

## 2023-03-27 DIAGNOSIS — R7302 Impaired glucose tolerance (oral): Secondary | ICD-10-CM | POA: Diagnosis not present

## 2023-03-27 DIAGNOSIS — I10 Essential (primary) hypertension: Secondary | ICD-10-CM | POA: Diagnosis not present

## 2023-03-27 DIAGNOSIS — E7849 Other hyperlipidemia: Secondary | ICD-10-CM | POA: Diagnosis not present

## 2023-03-28 LAB — TSH: TSH: 1.06 u[IU]/mL (ref 0.450–4.500)

## 2023-03-28 LAB — CMP14+EGFR
ALT: 13 [IU]/L (ref 0–32)
AST: 20 [IU]/L (ref 0–40)
Albumin: 3.8 g/dL — ABNORMAL LOW (ref 3.9–4.9)
Alkaline Phosphatase: 71 [IU]/L (ref 44–121)
BUN/Creatinine Ratio: 11 — ABNORMAL LOW (ref 12–28)
BUN: 11 mg/dL (ref 8–27)
Bilirubin Total: 0.4 mg/dL (ref 0.0–1.2)
CO2: 25 mmol/L (ref 20–29)
Calcium: 9.3 mg/dL (ref 8.7–10.3)
Chloride: 104 mmol/L (ref 96–106)
Creatinine, Ser: 0.99 mg/dL (ref 0.57–1.00)
Globulin, Total: 3 g/dL (ref 1.5–4.5)
Glucose: 88 mg/dL (ref 70–99)
Potassium: 4.4 mmol/L (ref 3.5–5.2)
Sodium: 142 mmol/L (ref 134–144)
Total Protein: 6.8 g/dL (ref 6.0–8.5)
eGFR: 61 mL/min/{1.73_m2} (ref >59)

## 2023-03-28 LAB — LIPID PANEL
Chol/HDL Ratio: 3 ratio (ref 0.0–4.4)
Cholesterol, Total: 186 mg/dL (ref 100–199)
HDL: 61 mg/dL (ref >39)
LDL Chol Calc (NIH): 110 mg/dL — ABNORMAL HIGH (ref 0–99)
Triglycerides: 80 mg/dL (ref 0–149)
VLDL Cholesterol Cal: 15 mg/dL (ref 5–40)

## 2023-03-28 LAB — HEMOGLOBIN A1C
Est. average glucose Bld gHb Est-mCnc: 117 mg/dL
Hgb A1c MFr Bld: 5.7 % — ABNORMAL HIGH (ref 4.8–5.6)

## 2023-03-28 LAB — VITAMIN D 25 HYDROXY (VIT D DEFICIENCY, FRACTURES): Vit D, 25-Hydroxy: 72.8 ng/mL (ref 30.0–100.0)

## 2023-04-01 ENCOUNTER — Ambulatory Visit (INDEPENDENT_AMBULATORY_CARE_PROVIDER_SITE_OTHER): Payer: Medicare Other | Admitting: Family Medicine

## 2023-04-01 ENCOUNTER — Encounter: Payer: Self-pay | Admitting: Family Medicine

## 2023-04-01 VITALS — BP 135/85 | HR 81 | Ht 67.0 in | Wt 206.0 lb

## 2023-04-01 DIAGNOSIS — E559 Vitamin D deficiency, unspecified: Secondary | ICD-10-CM

## 2023-04-01 DIAGNOSIS — I10 Essential (primary) hypertension: Secondary | ICD-10-CM | POA: Diagnosis not present

## 2023-04-01 DIAGNOSIS — E7849 Other hyperlipidemia: Secondary | ICD-10-CM | POA: Diagnosis not present

## 2023-04-01 DIAGNOSIS — Z0000 Encounter for general adult medical examination without abnormal findings: Secondary | ICD-10-CM

## 2023-04-01 DIAGNOSIS — R7302 Impaired glucose tolerance (oral): Secondary | ICD-10-CM | POA: Diagnosis not present

## 2023-04-01 NOTE — Progress Notes (Signed)
    [name]     MRN: [id number]      DOB: [birth year]  Chief Complaint  Patient presents with   Annual Exam    HPI: Patient is in for annual physical exam.  Recent labs,  are reviewed. Immunization is reviewed , and  updated if needed.   PE: BP 135/85 (BP Location: Right Arm, Patient Position: Sitting, Cuff Size: Large)   Pulse 81   Ht 5\' 7"  (1.702 m)   Wt 206 lb 0.6 oz (93.5 kg)   SpO2 95%   BMI 32.27 kg/m   Pleasant  female, alert and oriented x 3, in no cardio-pulmonary distress. Afebrile. HEENT No facial trauma or asymetry. Sinuses non tender.  Extra occullar muscles intact.. External ears normal, . Neck: supple, no adenopathy,JVD or thyromegaly.No bruits.  Chest: Clear to ascultation bilaterally.No crackles or wheezes. Non tender to palpation  Breast: Not examined, asymptomatic and  mammogram scheduled for 05/2023  Cardiovascular system; Heart sounds normal,  S1 and  S2 ,no S3.  No murmur, or thrill. Apical beat not displaced Peripheral pulses normal.  Abdomen: Soft, non tender, no organomegaly or masses. No bruits. Bowel sounds normal. No guarding, tenderness or rebound.   .   Musculoskeletal exam: Full ROM of spine, hips , shoulders and knees. No deformity ,swelling , crepitus noted.in right knee No muscle wasting or atrophy.   Neurologic: Cranial nerves 2 to 12 intact. Power, tone ,sensation and reflexes normal throughout. No disturbance in gait. No tremor.  Skin: Intact, no ulceration, erythema , scaling or rash noted. Pigmentation normal throughout  Psych; Normal mood and affect. Judgement and concentration normal   Assessment & Plan:  Annual physical exam Annual exam as documented. Counseling done  re healthy lifestyle involving commitment to 150 minutes exercise per week, heart healthy diet, and attaining healthy weight.The importance of adequate sleep also discussed. Changes in health habits are decided on by the patient  with goals and time frames  set for achieving them. Immunization and cancer screening needs are specifically addressed at this visit.

## 2023-04-01 NOTE — Assessment & Plan Note (Signed)
Annual exam as documented. Counseling done  re healthy lifestyle involving commitment to 150 minutes exercise per week, heart healthy diet, and attaining healthy weight.The importance of adequate sleep also discussed. Changes in health habits are decided on by the patient with goals and time frames  set for achieving them. Immunization and cancer screening needs are specifically addressed at this visit. 

## 2023-04-01 NOTE — Patient Instructions (Addendum)
F/U in 6 months, call if you need me sooner  Great that you have quit smoking, call [email or web address] for support 24/7  Work on dedicated exercise as discussed, reducing sugar and butter, and aim for 10 pound weight loss    Best for Season and 2025! Fasting lipid,cmp and EGFR and hBA1C and cBC 3 to 7 days before next visit  Thanks for choosing [facility], we consider it a privelige to serve you.

## 2023-04-02 ENCOUNTER — Ambulatory Visit: Payer: Medicare Other | Admitting: Family Medicine

## 2023-04-24 ENCOUNTER — Encounter: Payer: Self-pay | Admitting: Family Medicine

## 2023-04-27 NOTE — Telephone Encounter (Signed)
scheduled

## 2023-04-28 ENCOUNTER — Encounter: Payer: Self-pay | Admitting: Family Medicine

## 2023-04-28 ENCOUNTER — Ambulatory Visit (INDEPENDENT_AMBULATORY_CARE_PROVIDER_SITE_OTHER): Payer: Medicare Other | Admitting: Family Medicine

## 2023-04-28 VITALS — BP 138/89 | HR 106 | Ht 67.0 in | Wt 201.1 lb

## 2023-04-28 DIAGNOSIS — J0100 Acute maxillary sinusitis, unspecified: Secondary | ICD-10-CM | POA: Insufficient documentation

## 2023-04-28 MED ORDER — PROMETHAZINE-DM 6.25-15 MG/5ML PO SYRP
5.0000 mL | ORAL_SOLUTION | Freq: Four times a day (QID) | ORAL | 0 refills | Status: DC | PRN
Start: 2023-04-28 — End: 2023-07-03

## 2023-04-28 MED ORDER — AZITHROMYCIN 250 MG PO TABS
ORAL_TABLET | ORAL | 0 refills | Status: AC
Start: 2023-04-28 — End: 2023-05-03

## 2023-04-28 NOTE — Assessment & Plan Note (Signed)
Encouraged to start taking Promethazine DM for cough and cold symptoms. I recommend not taking loratadine while on your current treatment. Encouraged to complete the full course of the prescribed antibiotic, which has been prescribed for anaerobic coverage. Continue using Flonase nasal spray for congestion. Take medications as prescribed. Increase fluid intake and allow for plenty of rest. Use Tylenol as needed for pain, fever, or general discomfort. Gargle with warm salt water 3-4 times daily to help with throat pain or discomfort. Use a humidifier at bedtime to help with cough and nasal congestion. Follow up if your symptoms do not improve.

## 2023-04-28 NOTE — Patient Instructions (Addendum)
I appreciate the opportunity to provide care to you today!   Sinusitis  Please start taking Promethazine DM for cough and cold symptoms. I recommend not taking loratadine while on your current treatment. Please complete the full course of the prescribed antibiotic, which has been prescribed for anaerobic coverage. Continue using Flonase nasal spray for congestion. Take medications as prescribed. Increase fluid intake and allow for plenty of rest. Use Tylenol as needed for pain, fever, or general discomfort. Gargle with warm salt water 3-4 times daily to help with throat pain or discomfort. Use a humidifier at bedtime to help with cough and nasal congestion. Follow up if your symptoms do not improve.    Please continue to a heart-healthy diet and increase your physical activities. Try to exercise for at least five days a week.    It was a pleasure to see you and I look forward to continuing to work together on your health and well-being. Please do not hesitate to call the office if you need care or have questions about your care.  In case of emergency, please visit the Emergency Department for urgent care, or contact our clinic at [phone] to schedule an appointment. We're here to help you!   Have a wonderful day and week. With Gratitude, Gilmore Laroche MSN, FNP-BC

## 2023-04-28 NOTE — Progress Notes (Signed)
Acute Office Visit  Subjective:    Patient ID: [name], female    DOB: [birth year], 72 y.o.   MRN: [id number]  Chief Complaint  Patient presents with   Nasal Congestion    Pt reports sinus problems, coughing and drainage, also has right ear pain. Reports sx started on Friday (4 days ago) , worsened on Saturday and has been coughing non stop , making her ribs hurt. Has used Robitussin and allergy meds.    HPI The patient presents with complaints of facial pressure and pain, cough, postnasal drainage, and a  right earache for the past 4 days. She reports excessive coughing due to postnasal drainage and has been using over-the-counter Robitussin and loratadine twice daily with some symptom relief. The patient notes the onset of fever 2 days ago, but it has not been present today. She tested negative for COVID at home today and denies any recent sick contacts.   Past Medical History:  Diagnosis Date   Acute left-sided back pain with sciatica 11/27/2018   Allergy    Annual physical exam 04/12/2015   Arthritis    Phreesia 05/01/2020   Arthritis, rheumatoid (HCC) 1993   hx   Cancer (HCC)    Phreesia 05/01/2020   Cataract    Collagen vascular disease (HCC)    RA   COPD (chronic obstructive pulmonary disease) (HCC)    GERD (gastroesophageal reflux disease)    Hyperlipidemia    Hypertension    Kidney stone 1999   Non Hodgkin's lymphoma (HCC)    Obesity    Osteoporosis 2019   intolerant of fosamax due to jaw pain   Porphyria (HCC)    hx     Past Surgical History:  Procedure Laterality Date   APPENDECTOMY  1983   BREAST BIOPSY Left    diagnosid with non hogdkins lymphoma   COLONOSCOPY WITH PROPOFOL N/A 05/30/2020   Procedure: COLONOSCOPY WITH PROPOFOL;  Surgeon: Malissa Hippo, MD;  Location: [facility];  Service: Endoscopy;  Laterality: N/A;  10:30   LITHOTRIPSY  2000   rt renal stone    POLYPECTOMY  05/30/2020   Procedure: POLYPECTOMY;  Surgeon: Malissa Hippo, MD;  Location: [facility];  Service: Endoscopy;;    Family History  Problem Relation Age of Onset   Arthritis Mother    Hypertension Mother    Hypertension Sister    Arthritis Sister    Hypertension Sister    Arthritis Sister    Ulcerative colitis Sister    Hypertension Sister    Arthritis Sister    Heart disease Sister    Varicose Veins Sister    Stroke Maternal Grandmother    Arthritis Maternal Grandfather    Hearing loss Maternal Grandfather    Arthritis Paternal Grandmother    Stroke Paternal Grandfather    Hyperlipidemia Sister    Hypertension Sister     Social History   Socioeconomic History   Marital status: Single    Spouse name: Not on file   Number of children: 0   Years of education: 12+   Highest education level: Doctorate  Occupational History   Occupation: Retired; Teacher part time currently  Tobacco Use   Smoking status: Some Days    Current packs/day: 0.00    Average packs/day: 0.3 packs/day for 15.0 years (3.8 ttl pk-yrs)    Types: Cigarettes    Start date: 05/25/2005    Last attempt to quit: 05/25/2020    Years since quitting: 2.9  Passive exposure: Current   Smokeless tobacco: Never   Tobacco comments:    Smoke when stressed  Vaping Use   Vaping status: Never Used  Substance and Sexual Activity   Alcohol use: No   Drug use: No   Sexual activity: Not Currently    Birth control/protection: Abstinence, Post-menopausal  Other Topics Concern   Not on file  Social History Narrative   Lives alone    Social Determinants of Health   Financial Resource Strain: Low Risk  (03/31/2023)   Overall Financial Resource Strain (CARDIA)    Difficulty of Paying Living Expenses: Not hard at all  Food Insecurity: No Food Insecurity (03/31/2023)   Hunger Vital Sign    Worried About Running Out of Food in the Last Year: Never true    Ran Out of Food in the Last Year: Never true  Transportation Needs: No Transportation Needs (03/31/2023)   PRAPARE -  Administrator, Civil Service (Medical): No    Lack of Transportation (Non-Medical): No  Physical Activity: Insufficiently Active (03/31/2023)   Exercise Vital Sign    Days of Exercise per Week: 1 day    Minutes of Exercise per Session: 20 min  Stress: No Stress Concern Present (03/31/2023)   [employer] of Occupational Health - Occupational Stress Questionnaire    Feeling of Stress : Not at all  Social Connections: Moderately Integrated (03/31/2023)   Social Connection and Isolation Panel [NHANES]    Frequency of Communication with Friends and Family: More than three times a week    Frequency of Social Gatherings with Friends and Family: Once a week    Attends Religious Services: More than 4 times per year    Active Member of [employer] or Organizations: Yes    Attends Engineer, structural: More than 4 times per year    Marital Status: Never married  Intimate Partner Violence: Not At Risk (05/30/2022)   Humiliation, Afraid, Rape, and Kick questionnaire    Fear of Current or Ex-Partner: No    Emotionally Abused: No    Physically Abused: No    Sexually Abused: No    Outpatient Medications Prior to Visit  Medication Sig Dispense Refill   acetaminophen (TYLENOL) 500 MG tablet Take 1,000 mg by mouth every 6 (six) hours as needed for moderate pain.     albuterol (VENTOLIN HFA) 108 (90 Base) MCG/ACT inhaler Inhale 2 puffs into the lungs every 6 (six) hours as needed for wheezing or shortness of breath. 8 g 2   ALPRAZolam (XANAX) 0.25 MG tablet Take one tablet by mouth once daily, as needed, for anxiety 10 tablet 0   aspirin 81 MG EC tablet Take 1 tablet (81 mg total) by mouth daily. 30 tablet 12   Calcium Carbonate-Vitamin D 600-400 MG-UNIT tablet Take 1 tablet by mouth 2 (two) times daily with a meal.       chlorpheniramine (CHLOR-TRIMETON) 4 MG tablet Take 1 tablet (4 mg total) by mouth 2 (two) times daily as needed for allergies. 14 tablet 0   Cholecalciferol (VITAMIN  D3) 5000 units TABS Take 5,000 Units by mouth daily.     Coenzyme Q10 100 MG capsule Take 100 mg by mouth 3 (three) times daily.     Collagen Hydrolysate POWD by Does not apply route 2 (two) times daily.     EPINEPHrine 0.3 mg/0.3 mL IJ SOAJ injection Inject 0.3 mg into the muscle as needed for anaphylaxis. 1 each 2   fluticasone (FLONASE) 50  MCG/ACT nasal spray Place 2 sprays into both nostrils daily. (Patient taking differently: Place 2 sprays into both nostrils daily as needed for allergies.) 48 g 1   gabapentin (NEURONTIN) 100 MG capsule Take 1 capsule (100 mg total) by mouth at bedtime. 90 capsule 3   hydroxychloroquine (PLAQUENIL) 200 MG tablet Take 400 mg by mouth daily.      loratadine (CLARITIN) 10 MG tablet Take one tablet by mouth one to  two times daily u 60 tablet 5   losartan (COZAAR) 50 MG tablet Take 1 tablet (50 mg total) by mouth daily. 90 tablet 3   montelukast (SINGULAIR) 10 MG tablet Take 1 tablet (10 mg total) by mouth at bedtime. 90 tablet 3   Multiple Vitamins-Minerals (CENTRUM SILVER PO) Take 1 tablet by mouth daily.     nicotine (NICODERM CQ) 21 mg/24hr patch Place 1 patch (21 mg total) onto the skin daily.     Omega-3 Fatty Acids (FISH OIL) 1000 MG CAPS Take 1 capsule (1,000 mg total) by mouth daily.  0   omeprazole (PRILOSEC) 20 MG capsule Take 20 mg by mouth at bedtime.     pravastatin (PRAVACHOL) 10 MG tablet Take 1 tablet (10 mg total) by mouth daily. 90 tablet 3   Turmeric 500 MG CAPS Take 1 capsule by mouth.     vitamin B-12 (CYANOCOBALAMIN) 50 MCG tablet Take 50 mcg by mouth daily.     Zinc 50 MG TABS Take 50 mg by mouth daily.     No facility-administered medications prior to visit.    Allergies  Allergen Reactions   Septra [Sulfamethoxazole-Trimethoprim] Dermatitis   Barbiturates Other (See Comments)    Prophyria    Chlordiazepoxide Other (See Comments)    unknown   Hydrocodone-Acetaminophen Hives and Other (See Comments)   Pentothal [Thiopental]      Hives?   Phenytoin Other (See Comments)    Prophyria    Rofecoxib Other (See Comments)    Prophyria    Cefuroxime Axetil Rash   Other    Wellbutrin [Bupropion] Palpitations    Review of Systems  Constitutional:  Negative for chills and fever.  HENT:  Positive for congestion, postnasal drip, rhinorrhea, sinus pressure and sinus pain. Negative for sore throat.   Eyes:  Negative for visual disturbance.  Respiratory:  Positive for cough. Negative for chest tightness and shortness of breath.   Neurological:  Negative for dizziness and headaches.       Objective:    Physical Exam HENT:     Head: Normocephalic.     Nose:     Right Sinus: Maxillary sinus tenderness present. No frontal sinus tenderness.     Left Sinus: No maxillary sinus tenderness.     Mouth/Throat:     Mouth: Mucous membranes are moist.  Cardiovascular:     Rate and Rhythm: Normal rate.     Heart sounds: Normal heart sounds.  Pulmonary:     Effort: Pulmonary effort is normal.     Breath sounds: Normal breath sounds.  Neurological:     Mental Status: She is alert.     BP 138/89   Pulse (!) 106   Ht 5\' 7"  (1.702 m)   Wt 201 lb 1.9 oz (91.2 kg)   SpO2 96%   BMI 31.50 kg/m  Wt Readings from Last 3 Encounters:  04/28/23 201 lb 1.9 oz (91.2 kg)  04/01/23 206 lb 0.6 oz (93.5 kg)  02/03/23 203 lb 12.8 oz (92.4 kg)  Assessment & Plan:  Acute non-recurrent maxillary sinusitis Assessment & Plan: Encouraged to start taking Promethazine DM for cough and cold symptoms. I recommend not taking loratadine while on your current treatment. Encouraged to complete the full course of the prescribed antibiotic, which has been prescribed for anaerobic coverage. Continue using Flonase nasal spray for congestion. Take medications as prescribed. Increase fluid intake and allow for plenty of rest. Use Tylenol as needed for pain, fever, or general discomfort. Gargle with warm salt water 3-4 times daily to help with  throat pain or discomfort. Use a humidifier at bedtime to help with cough and nasal congestion. Follow up if your symptoms do not improve.  Orders: -     Promethazine-DM; Take 5 mLs by mouth 4 (four) times daily as needed.  Dispense: 118 mL; Refill: 0 -     Azithromycin; Take 2 tablets on day 1, then 1 tablet daily on days 2 through 5  Dispense: 6 tablet; Refill: 0  Note: This chart has been completed using Engineer, civil (consulting) software, and while attempts have been made to ensure accuracy, certain words and phrases may not be transcribed as intended.    Gilmore Laroche, FNP

## 2023-05-29 ENCOUNTER — Ambulatory Visit (HOSPITAL_COMMUNITY)
Admission: RE | Admit: 2023-05-29 | Discharge: 2023-05-29 | Disposition: A | Discharge status: Home / Self Care | Payer: Medicare Other | Source: Ambulatory Visit | Attending: "Endocrinology | Admitting: "Endocrinology

## 2023-05-29 ENCOUNTER — Encounter (HOSPITAL_COMMUNITY): Payer: Self-pay

## 2023-05-29 ENCOUNTER — Ambulatory Visit (HOSPITAL_COMMUNITY)
Admission: RE | Admit: 2023-05-29 | Discharge: 2023-05-29 | Disposition: A | Discharge status: Home / Self Care | Payer: Medicare Other | Source: Ambulatory Visit | Attending: Hematology | Admitting: Hematology

## 2023-05-29 DIAGNOSIS — Z853 Personal history of malignant neoplasm of breast: Secondary | ICD-10-CM | POA: Insufficient documentation

## 2023-05-29 DIAGNOSIS — M810 Age-related osteoporosis without current pathological fracture: Secondary | ICD-10-CM | POA: Diagnosis not present

## 2023-05-29 DIAGNOSIS — Z1231 Encounter for screening mammogram for malignant neoplasm of breast: Secondary | ICD-10-CM | POA: Diagnosis not present

## 2023-05-29 DIAGNOSIS — M069 Rheumatoid arthritis, unspecified: Secondary | ICD-10-CM | POA: Insufficient documentation

## 2023-05-29 DIAGNOSIS — Z13820 Encounter for screening for osteoporosis: Secondary | ICD-10-CM | POA: Diagnosis not present

## 2023-05-29 DIAGNOSIS — Z780 Asymptomatic menopausal state: Secondary | ICD-10-CM | POA: Insufficient documentation

## 2023-06-02 ENCOUNTER — Encounter: Payer: Self-pay | Admitting: "Endocrinology

## 2023-06-02 ENCOUNTER — Ambulatory Visit: Payer: Medicare Other | Admitting: "Endocrinology

## 2023-06-02 VITALS — BP 136/76 | HR 80 | Ht 67.0 in | Wt 203.2 lb

## 2023-06-02 DIAGNOSIS — R7303 Prediabetes: Secondary | ICD-10-CM

## 2023-06-02 DIAGNOSIS — M818 Other osteoporosis without current pathological fracture: Secondary | ICD-10-CM | POA: Diagnosis not present

## 2023-06-02 NOTE — Progress Notes (Signed)
 06/02/2023     Endocrinology follow-up note     Past Medical History:  Diagnosis Date   Acute left-sided back pain with sciatica 11/27/2018   Allergy    Annual physical exam 04/12/2015   Arthritis    Phreesia 05/01/2020   Arthritis, rheumatoid (HCC) 1993   hx   Cancer (HCC)    Phreesia 05/01/2020   Cataract    Collagen vascular disease (HCC)    RA   COPD (chronic obstructive pulmonary disease) (HCC)    GERD (gastroesophageal reflux disease)    Hyperlipidemia    Hypertension    Kidney stone 1999   Non Hodgkin's lymphoma (HCC)    Obesity    Osteoporosis 2019   intolerant of fosamax  due to jaw pain   Porphyria (HCC)    hx    Past Surgical History:  Procedure Laterality Date   APPENDECTOMY  1983   BREAST BIOPSY Left    diagnosed with non hogdkins lymphoma   COLONOSCOPY WITH PROPOFOL  N/A 05/30/2020   Procedure: COLONOSCOPY WITH PROPOFOL ;  Surgeon: Golda Claudis PENNER, MD;  Location: [facility];  Service: Endoscopy;  Laterality: N/A;  10:30   LITHOTRIPSY  2000   rt renal stone    POLYPECTOMY  05/30/2020   Procedure: POLYPECTOMY;  Surgeon: Golda Claudis PENNER, MD;  Location: [facility];  Service: Endoscopy;;   Social History   Socioeconomic History   Marital status: Single    Spouse name: Not on file   Number of children: 0   Years of education: 12+   Highest education level: Doctorate  Occupational History   Occupation: Retired; Teacher part time currently  Tobacco Use   Smoking status: Some Days    Current packs/day: 0.00    Average packs/day: 0.3 packs/day for 15.0 years (3.8 ttl pk-yrs)    Types: Cigarettes    Start date: 05/25/2005    Last attempt to quit: 05/25/2020    Years since quitting: 3.0    Passive exposure: Current   Smokeless tobacco: Never   Tobacco comments:    Smoke when stressed  Vaping Use   Vaping status: Never Used  Substance and Sexual Activity   Alcohol use: No   Drug  use: No   Sexual activity: Not Currently    Birth control/protection: Abstinence, Post-menopausal  Other Topics Concern   Not on file  Social History Narrative   Lives alone    Social Drivers of Health   Financial Resource Strain: Low Risk  (03/31/2023)   Overall Financial Resource Strain (CARDIA)    Difficulty of Paying Living Expenses: Not hard at all  Food Insecurity: No Food Insecurity (03/31/2023)   Hunger Vital Sign    Worried About Running Out of Food in the Last Year: Never true    Ran Out of Food in the Last Year: Never true  Transportation Needs: No Transportation Needs (03/31/2023)   PRAPARE - Administrator, Civil Service (Medical): No    Lack of Transportation (Non-Medical): No  Physical Activity: Insufficiently Active (03/31/2023)   Exercise Vital Sign    Days of Exercise per Week: 1 day    Minutes of Exercise  per Session: 20 min  Stress: No Stress Concern Present (03/31/2023)   [employer] of Occupational Health - Occupational Stress Questionnaire    Feeling of Stress : Not at all  Social Connections: Moderately Integrated (03/31/2023)   Social Connection and Isolation Panel [NHANES]    Frequency of Communication with Friends and Family: More than three times a week    Frequency of Social Gatherings with Friends and Family: Once a week    Attends Religious Services: More than 4 times per year    Active Member of [employer] or Organizations: Yes    Attends Banker Meetings: More than 4 times per year    Marital Status: Never married   Outpatient Encounter Medications as of 06/02/2023  Medication Sig   acetaminophen  (TYLENOL ) 500 MG tablet Take 1,000 mg by mouth every 6 (six) hours as needed for moderate pain.   albuterol  (VENTOLIN  HFA) 108 (90 Base) MCG/ACT inhaler Inhale 2 puffs into the lungs every 6 (six) hours as needed for wheezing or shortness of breath.   ALPRAZolam  (XANAX ) 0.25 MG tablet Take one tablet by mouth once daily, as needed,  for anxiety   aspirin 81 MG EC tablet Take 1 tablet (81 mg total) by mouth daily.   Calcium  Carbonate-Vitamin D  600-400 MG-UNIT tablet Take 1 tablet by mouth 2 (two) times daily with a meal.     Cholecalciferol (VITAMIN D3) 5000 units TABS Take 5,000 Units by mouth every other day.   Coenzyme Q10 100 MG capsule Take 100 mg by mouth 3 (three) times daily.   Collagen Hydrolysate POWD by Does not apply route 2 (two) times daily.   EPINEPHrine  0.3 mg/0.3 mL IJ SOAJ injection Inject 0.3 mg into the muscle as needed for anaphylaxis.   fluticasone  (FLONASE ) 50 MCG/ACT nasal spray Place 2 sprays into both nostrils daily. (Patient taking differently: Place 2 sprays into both nostrils daily as needed for allergies.)   gabapentin  (NEURONTIN ) 100 MG capsule Take 1 capsule (100 mg total) by mouth at bedtime.   hydroxychloroquine (PLAQUENIL) 200 MG tablet Take 400 mg by mouth daily.    loratadine  (CLARITIN ) 10 MG tablet Take one tablet by mouth one to  two times daily u   losartan  (COZAAR ) 50 MG tablet Take 1 tablet (50 mg total) by mouth daily.   montelukast  (SINGULAIR ) 10 MG tablet Take 1 tablet (10 mg total) by mouth at bedtime.   Multiple Vitamins-Minerals (CENTRUM SILVER PO) Take 1 tablet by mouth daily.   nicotine (NICODERM CQ) 21 mg/24hr patch Place 1 patch (21 mg total) onto the skin daily.   Omega-3 Fatty Acids (FISH OIL ) 1000 MG CAPS Take 1 capsule (1,000 mg total) by mouth daily.   omeprazole (PRILOSEC) 20 MG capsule Take 20 mg by mouth at bedtime.   pravastatin  (PRAVACHOL ) 10 MG tablet Take 1 tablet (10 mg total) by mouth daily.   promethazine -dextromethorphan (PROMETHAZINE -DM) 6.25-15 MG/5ML syrup Take 5 mLs by mouth 4 (four) times daily as needed.   Turmeric 500 MG CAPS Take 1 capsule by mouth.   vitamin B-12 (CYANOCOBALAMIN) 50 MCG tablet Take 50 mcg by mouth daily.   Zinc 50 MG TABS Take 50 mg by mouth daily.   [DISCONTINUED] chlorpheniramine  (CHLOR-TRIMETON ) 4 MG tablet Take 1 tablet (4 mg  total) by mouth 2 (two) times daily as needed for allergies.   No facility-administered encounter medications on file as of 06/02/2023.   ALLERGIES: Allergies  Allergen Reactions   Septra [Sulfamethoxazole-Trimethoprim] Dermatitis   Barbiturates Other (  See Comments)    Prophyria    Chlordiazepoxide Other (See Comments)    unknown   Hydrocodone -Acetaminophen  Hives and Other (See Comments)   Pentothal [Thiopental]     Hives?   Phenytoin Other (See Comments)    Prophyria    Rofecoxib Other (See Comments)    Prophyria    Cefuroxime Axetil Rash   Other    Wellbutrin  [Bupropion ] Palpitations    VACCINATION STATUS: Immunization History  Administered Date(s) Administered   Fluad Quad(high Dose 65+) 03/07/2019, 02/14/2021, 03/22/2022, 03/18/2023   Influenza Inj Mdck Quad Pf 03/18/2017   Influenza Split 02/04/2020   Influenza Whole 06/16/2006, 05/17/2009   Influenza,inj,Quad PF,6+ Mos 02/12/2018, 02/04/2020   Influenza-Unspecified 02/12/2018   MODERNA COVID-19 SARS-COV-2 PEDS BIVALENT BOOSTER 9yr-77yr 02/28/2021   Moderna Covid-19 Vaccine Bivalent Booster 45yrs & up 03/31/2023   Moderna Sars-Covid-2 Vaccination 06/30/2019, 07/29/2019, 03/10/2020, 09/03/2020   Pneumococcal Conjugate-13 12/21/2017   Pneumococcal Polysaccharide-23 06/07/2019   Td 06/16/2006   Td (Adult), 2 Lf Tetanus Toxid, Preservative Free 06/16/2006   Tdap 03/24/2014   Zoster, Live 09/28/2014     HPI   [name] is 73 y.o. female who presents today with a medical history as above. she is being seen in follow-up after she was seen in consultation for  Osteoporosis requested by Antonetta Rollene BRAVO, MD.  Patient was diagnosed with osteoporosis  approximately  3 years ago. She denies fractures or falls. No dizziness/vertigo/orthostasis. She is status post her second Reclast  infusion in April 2024.   She tolerated the medication very well.  She underwent previsit bone density which showed slight improvement,  however no statistically significant change compared to her last bone density from 2022.  She previously took Fosamax  70 mg p.o. weekly, intermittently. She took 12/24 months roughly, prior to her last visit.  she is on regular calcium  and vitamin D  supplement.  Her previsit labs show calcium  at 9.2, -25-hydroxy vitamin D  at 72.8 . She also eats dairy and green, leafy, vegetables.   No weight bearing exercises.   No h/o hyper/hypocalcemia. No h/o hyperparathyroidism. No h/o kidney stones.  Pt does have a FH of osteoporosis. In her mother. She denies height loss. She is a survivor of  NHL.    Review of Systems  Constitutional: no weight gain/loss, no fatigue, no subjective hyperthermia, no subjective hypothermia Eyes: no blurry vision, no xerophthalmia ENT: no sore throat, no nodules palpated in throat, no dysphagia/odynophagia, no hoarseness   Objective:    BP 136/76   Pulse 80   Ht 5' 7 (1.702 m)   Wt 203 lb 3.2 oz (92.2 kg)   BMI 31.83 kg/m   Wt Readings from Last 3 Encounters:  06/02/23 203 lb 3.2 oz (92.2 kg)  04/28/23 201 lb 1.9 oz (91.2 kg)  04/01/23 206 lb 0.6 oz (93.5 kg)    Physical Exam  Constitutional:  BMI of 31,  not in acute distress, normal state of mind Eyes: PERRLA, EOMI, no exophthalmos ENT: moist mucous membranes, no thyromegaly, no cervical lymphadenopathy   CMP ( most recent) CMP     Component Value Date/Time   NA 142 03/27/2023 0819   K 4.4 03/27/2023 0819   CL 104 03/27/2023 0819   CO2 25 03/27/2023 0819   GLUCOSE 88 03/27/2023 0819   GLUCOSE 97 10/08/2022 0759   BUN 11 03/27/2023 0819   CREATININE 0.99 03/27/2023 0819   CREATININE 0.85 11/17/2018 0743   CALCIUM  9.3 03/27/2023 0819   PROT 6.8 03/27/2023 0819  ALBUMIN 3.8 (L) 03/27/2023 0819   AST 20 03/27/2023 0819   ALT 13 03/27/2023 0819   ALKPHOS 71 03/27/2023 0819   BILITOT 0.4 03/27/2023 0819   GFRNONAA >60 10/08/2022 0759   GFRNONAA 72 11/17/2018 0743   GFRAA >60  10/05/2019 1427   GFRAA 83 11/17/2018 0743     Diabetic Labs (most recent): Lab Results  Component Value Date   HGBA1C 5.7 (H) 03/27/2023   HGBA1C 5.4 11/17/2018   HGBA1C 5.7 (H) 05/12/2017     Lipid Panel ( most recent) Lipid Panel     Component Value Date/Time   CHOL 186 03/27/2023 0819   TRIG 80 03/27/2023 0819   HDL 61 03/27/2023 0819   CHOLHDL 3.0 03/27/2023 0819   CHOLHDL 3.0 10/08/2022 0804   VLDL 17 10/08/2022 0804   LDLCALC 110 (H) 03/27/2023 0819   LDLCALC 122 (H) 11/17/2018 0743   LABVLDL 15 03/27/2023 0819      Lab Results  Component Value Date   TSH 1.060 03/27/2023   TSH 0.739 03/24/2022   TSH 1.020 03/20/2021   TSH 0.90 05/10/2018   TSH 0.71 05/24/2016   TSH 0.803 04/11/2015   TSH 0.770 12/24/2011   TSH 1.766 12/19/2010   TSH 0.933 10/06/2007     DXA  : T- Score -3.0 spine, 04/2021, -2.9 in 03/2018 DXA : Femur -3.0 2022, -3.1 2019     Bone density on May 29, 2023  [facility] Spine L1-L3 05/29/2023 70.4 Osteoporosis -2.8 0.830 g/cm2 1.8% - AP Spine L1-L3 05/17/2021 68.4 Osteoporosis -3.0 0.815 g/cm2 0.1% - AP Spine L1-L3 03/31/2018 65.3 Osteoporosis -3.0 0.814 g/cm2 - -   DualFemur Total Right 05/29/2023 70.4 Osteoporosis -3.0 0.628 g/cm2 0.5% - DualFemur Total Right 05/17/2021 68.4 Osteoporosis -3.0 0.625 g/cm2 0.8% - DualFemur Total Right 03/31/2018 65.3 Osteoporosis -3.1 0.620 g/cm2 - -   DualFemur Total Mean 05/29/2023 70.4 Osteoporosis -2.7 0.662 g/cm2 -1.0% - DualFemur Total Mean 05/17/2021 68.4 Osteoporosis -2.7 0.669 g/cm2 2.5% - DualFemur Total Mean 03/31/2018 65.3 Osteoporosis -2.8 0.653 g/cm2 - - ASSESSMENT: The BMD measured at Femur Total Right is 0.628 g/cm2 with a T-score of -3.0. This patient is considered osteoporotic according to World Health Organization [facility]) criteria. The scan quality is good. Compared with the prior study on 05/17/21, the BMD of the total mean shows no statistically significant change. L4 was excluded  due to advanced degenerative changes.  Assessment: 1. Osteoporosis  2.  Prediabetes  Plan: 1. Osteoporosis - likely multifactorial vs postmenopausal  -Patient is status post Reclast  infusion in April 2023. Her previsit labs show normalization of her calcium  and vitamin D , advised to continue her calcium  supplement  calcium  carbonate 600-400 mg to twice a day with meals.  We discussed about increased risk of fracture, depending on the T score, greatly increased when the T score is lower than -2.5. She has tolerated Reclast  treatment, status post heart second treatment.  She is due for her third Reclast  infusion in April 2025.  Her interval labs are favorable.  Her previously documented mild hypocalcemia has resolved.  - discussed fall precautions    She has normal renal function.  -She is advised to lower her vitamin D3 to 5000 units every other day. Regarding her prediabetes, - she acknowledges that there is a room for improvement in her food and drink choices. - Suggestion is made for her to avoid simple carbohydrates  from her diet including Cakes, Sweet Desserts, Ice Cream, Soda (diet and regular), Sweet Tea, Candies, Chips,  Cookies, Store Bought Juices, Alcohol , Artificial Sweeteners,  Coffee Creamer, and Sugar-free Products, Lemonade. This will help patient to have more stable blood glucose profile and potentially avoid unintended weight gain.  - I advised patient to maintain close follow up with Antonetta Rollene BRAVO, MD for primary care needs.    I spent  22  minutes in the care of the patient today including review of labs from Thyroid  Function, CMP, and other relevant labs ; imaging/biopsy records (current and previous including abstractions from other facilities); face-to-face time discussing  her lab results and symptoms, medications doses, her options of short and long term treatment based on the latest standards of care / guidelines;   and documenting the encounter.  Ilianna Bown  Luera  participated in the discussions, expressed understanding, and voiced agreement with the above plans.  All questions were answered to her satisfaction. she is encouraged to contact clinic should she have any questions or concerns prior to her return visit.    Follow up plan: Return in about 6 months (around 11/30/2023), or Reclast  in April 2025, for Refer her for Reclast  Infusion.   Ranny Earl, MD [facility] [address] [place], [place] [address] Phone: [phone]  Fax: [phone]     06/02/2023, 2:28 PM  This note was partially dictated with voice recognition software. Similar sounding words can be transcribed inadequately or may not  be corrected upon review.

## 2023-06-02 NOTE — Patient Instructions (Signed)

## 2023-07-01 DIAGNOSIS — Z79899 Other long term (current) drug therapy: Secondary | ICD-10-CM | POA: Diagnosis not present

## 2023-07-01 DIAGNOSIS — H209 Unspecified iridocyclitis: Secondary | ICD-10-CM | POA: Diagnosis not present

## 2023-07-01 DIAGNOSIS — M1991 Primary osteoarthritis, unspecified site: Secondary | ICD-10-CM | POA: Diagnosis not present

## 2023-07-01 DIAGNOSIS — M0579 Rheumatoid arthritis with rheumatoid factor of multiple sites without organ or systems involvement: Secondary | ICD-10-CM | POA: Diagnosis not present

## 2023-07-03 ENCOUNTER — Encounter: Payer: Self-pay | Admitting: Family Medicine

## 2023-07-03 ENCOUNTER — Telehealth: Payer: Medicare Other | Admitting: Family Medicine

## 2023-07-03 ENCOUNTER — Telehealth (INDEPENDENT_AMBULATORY_CARE_PROVIDER_SITE_OTHER): Payer: Medicare Other | Admitting: Family Medicine

## 2023-07-03 VITALS — BP 120/80 | HR 92 | Wt 198.0 lb

## 2023-07-03 DIAGNOSIS — I10 Essential (primary) hypertension: Secondary | ICD-10-CM

## 2023-07-03 DIAGNOSIS — R059 Cough, unspecified: Secondary | ICD-10-CM | POA: Insufficient documentation

## 2023-07-03 DIAGNOSIS — R051 Acute cough: Secondary | ICD-10-CM

## 2023-07-03 DIAGNOSIS — J0100 Acute maxillary sinusitis, unspecified: Secondary | ICD-10-CM | POA: Diagnosis not present

## 2023-07-03 MED ORDER — BENZONATATE 100 MG PO CAPS: 100.0000 mg | ORAL_CAPSULE | Freq: Two times a day (BID) | ORAL | 1 refills | Status: DC | PRN

## 2023-07-03 MED ORDER — PREDNISONE 10 MG PO TABS
10.0000 mg | ORAL_TABLET | Freq: Two times a day (BID) | ORAL | 0 refills | Status: DC
Start: 2023-07-03 — End: 2023-07-29

## 2023-07-03 MED ORDER — PROMETHAZINE-DM 6.25-15 MG/5ML PO SYRP
ORAL_SOLUTION | ORAL | 0 refills | Status: DC
Start: 2023-07-03 — End: 2023-09-29

## 2023-07-03 MED ORDER — FLUCONAZOLE 150 MG PO TABS: 150.0000 mg | ORAL_TABLET | Freq: Once | ORAL | 0 refills | Status: AC

## 2023-07-03 MED ORDER — DOXYCYCLINE HYCLATE 100 MG PO TABS: 100.0000 mg | ORAL_TABLET | Freq: Two times a day (BID) | ORAL | 0 refills | Status: DC

## 2023-07-03 NOTE — Patient Instructions (Addendum)
 F/u in May as before, call if you need me sooner  You are treated for acute right maxillary sinusitis, and acute cough. Also of note , in December you had similar symptoms and reported right ear pain with drainage. If symptoms persist and do not resolve entirely pls send a message I will order an x ray of your sinuses  Doxycycline , tessalon  perles, prednisone  , phenergan  DM and fluconazole  are prescribed as discussed  Fluids, rest , good nutrition and taking medication as prescribed especially completing antibiotic course are  vital for full recovery  Thanks for choosing [facility], we consider it a privelige to serve you.

## 2023-07-03 NOTE — Assessment & Plan Note (Signed)
Doxycycline x 10 days

## 2023-07-03 NOTE — Progress Notes (Signed)
 Virtual Visit via Video Note  I connected with RYLEIGH ESQUEDA on 07/03/23 at  2:40 PM EST by a video enabled telemedicine application and verified that I am speaking with the correct person using two identifiers.  Location: Patient: home Provider: office   I discussed the limitations of evaluation and management by telemedicine and the availability of in person appointments. The patient expressed understanding and agreed to proceed.  History of Present Illness: 2 week h/o sinus pressure, right ear pain and cough , sick exposure in family members for approx 3 weeks prior, significantly worsened in past 5 days, was in the ED with sister this  past Monday Complaints as recoirdered by Nurse   Observations/Objective: BP 120/80   Pulse 92   Wt 198 lb (89.8 kg)   BMI 31.01 kg/m  Good communication with no confusion and intact memory.Ill appearing Alert and oriented x 3 Nasal congestion and cough noted   Assessment and Plan:  Maxillary sinusitis, acute Doxycycline  x 10 days  Cough Pednisone x 5 days, tessalon  perles and phenergan  dm  Acute non-recurrent maxillary sinusitis Doxycycline  prescribed , if symptoms persist  or worsen , needs X ray of siniuses as had similar symptoms in 04/2023  Essential hypertension Controlled, no change in medication   Follow Up Instructions:    I discussed the assessment and treatment plan with the patient. The patient was provided an opportunity to ask questions and all were answered. The patient agreed with the plan and demonstrated an understanding of the instructions.   The patient was advised to call back or seek an in-person evaluation if the symptoms worsen or if the condition fails to improve as anticipated.  I provided 14 minutes of non-face-to-face time during this encounter.   Rollene Pesa, MD

## 2023-07-03 NOTE — Assessment & Plan Note (Signed)
 Pednisone x 5 days, tessalon  perles and phenergan  dm

## 2023-07-05 ENCOUNTER — Encounter: Payer: Self-pay | Admitting: Family Medicine

## 2023-07-05 NOTE — Assessment & Plan Note (Signed)
 Doxycycline  prescribed , if symptoms persist  or worsen , needs X ray of siniuses as had similar symptoms in 04/2023

## 2023-07-05 NOTE — Assessment & Plan Note (Signed)
 Controlled, no change in medication

## 2023-07-29 ENCOUNTER — Ambulatory Visit (INDEPENDENT_AMBULATORY_CARE_PROVIDER_SITE_OTHER): Payer: Medicare Other

## 2023-07-29 VITALS — Ht 67.0 in | Wt 198.0 lb

## 2023-07-29 DIAGNOSIS — Z0000 Encounter for general adult medical examination without abnormal findings: Secondary | ICD-10-CM | POA: Diagnosis not present

## 2023-07-29 NOTE — Progress Notes (Signed)
 Because this visit was a virtual/telehealth visit,  certain criteria was not obtained, such a blood pressure, CBG if applicable, and timed get up and go. Any medications not marked as "taking" were not mentioned during the medication reconciliation part of the visit. Any vitals not documented were not able to be obtained due to this being a telehealth visit or patient was unable to self-report a recent blood pressure reading due to a lack of equipment at home via telehealth. Vitals that have been documented are verbally provided by the patient.   Subjective:   [name] is a 73 y.o. who presents for a Medicare Wellness preventive visit.  Visit Complete: Virtual I connected with  [name] on 07/29/23 by a audio enabled telemedicine application and verified that I am speaking with the correct person using two identifiers.  Patient Location: Home  Provider Location: Home Office  I discussed the limitations of evaluation and management by telemedicine. The patient expressed understanding and agreed to proceed.  Vital Signs: Because this visit was a virtual/telehealth visit, some criteria may be missing or patient reported. Any vitals not documented were not able to be obtained and vitals that have been documented are patient reported.  VideoError- Librarian, academic were attempted between this provider and patient, however failed, due to patient having technical difficulties OR patient did not have access to video capability.  We continued and completed visit with audio only.   AWV Questionnaire: Yes: Patient Medicare AWV questionnaire was completed by the patient on 07/29/2023; I have confirmed that all information answered by patient is correct and no changes since this date.  Cardiac Risk Factors include: advanced age (>61men, >8 women);dyslipidemia;hypertension;obesity (BMI >30kg/m2);smoking/ tobacco exposure;sedentary lifestyle     Objective:     Today's Vitals   07/29/23 1121  Weight: 198 lb (89.8 kg)  Height: 5\' 7"  (1.702 m)   Body mass index is 31.01 kg/m.     07/29/2023   11:24 AM 10/13/2022   11:44 AM 05/30/2022    1:23 PM 02/12/2022    3:11 PM 10/14/2021   11:46 AM 05/21/2021    3:44 PM 04/08/2021   10:44 AM  Advanced Directives  Does Patient Have a Medical Advance Directive? No No Yes No Yes Yes Yes  Type of Agricultural consultant;Living will Healthcare Power of [employer] Power of Attorney  Does patient want to make changes to medical advance directive?   No - Patient declined  No - Patient declined  No - Patient declined  Copy of Healthcare Power of Attorney in Chart?     No - copy requested  No - copy requested  Would patient like information on creating a medical advance directive? No - Patient declined No - Patient declined  No - Patient declined No - Patient declined      Current Medications (verified) Outpatient Encounter Medications as of 07/29/2023  Medication Sig   acetaminophen (TYLENOL) 500 MG tablet Take 1,000 mg by mouth every 6 (six) hours as needed for moderate pain.   albuterol (VENTOLIN HFA) 108 (90 Base) MCG/ACT inhaler Inhale 2 puffs into the lungs every 6 (six) hours as needed for wheezing or shortness of breath.   ALPRAZolam (XANAX) 0.25 MG tablet Take one tablet by mouth once daily, as needed, for anxiety   aspirin 81 MG EC tablet Take 1 tablet (81 mg total) by mouth daily.   benzonatate (TESSALON PERLES) 100 MG capsule Take  1 capsule (100 mg total) by mouth 2 (two) times daily as needed for cough.   Calcium Carbonate-Vitamin D 600-400 MG-UNIT tablet Take 1 tablet by mouth 2 (two) times daily with a meal.     Cholecalciferol (VITAMIN D3) 5000 units TABS Take 5,000 Units by mouth every other day.   Coenzyme Q10 100 MG capsule Take 100 mg by mouth 3 (three) times daily.   Collagen Hydrolysate POWD by Does not apply route 2 (two) times daily.   EPINEPHrine 0.3  mg/0.3 mL IJ SOAJ injection Inject 0.3 mg into the muscle as needed for anaphylaxis.   fluticasone (FLONASE) 50 MCG/ACT nasal spray Place 2 sprays into both nostrils daily. (Patient taking differently: Place 2 sprays into both nostrils daily as needed for allergies.)   gabapentin (NEURONTIN) 100 MG capsule Take 1 capsule (100 mg total) by mouth at bedtime.   hydroxychloroquine (PLAQUENIL) 200 MG tablet Take 400 mg by mouth daily.    loratadine (CLARITIN) 10 MG tablet Take one tablet by mouth one to  two times daily u   losartan (COZAAR) 50 MG tablet Take 1 tablet (50 mg total) by mouth daily.   montelukast (SINGULAIR) 10 MG tablet Take 1 tablet (10 mg total) by mouth at bedtime.   Multiple Vitamins-Minerals (CENTRUM SILVER PO) Take 1 tablet by mouth daily.   nicotine (NICODERM CQ) 21 mg/24hr patch Place 1 patch (21 mg total) onto the skin daily.   Omega-3 Fatty Acids (FISH OIL) 1000 MG CAPS Take 1 capsule (1,000 mg total) by mouth daily.   omeprazole (PRILOSEC) 20 MG capsule Take 20 mg by mouth at bedtime.   pravastatin (PRAVACHOL) 10 MG tablet Take 1 tablet (10 mg total) by mouth daily.   promethazine-dextromethorphan (PROMETHAZINE-DM) 6.25-15 MG/5ML syrup Half to one teaspoons by mouth every 8 hours as needed , for excessive coiugh   Turmeric 500 MG CAPS Take 1 capsule by mouth.   vitamin B-12 (CYANOCOBALAMIN) 50 MCG tablet Take 50 mcg by mouth daily.   Zinc 50 MG TABS Take 50 mg by mouth daily.   [DISCONTINUED] doxycycline (VIBRA-TABS) 100 MG tablet Take 1 tablet (100 mg total) by mouth 2 (two) times daily.   [DISCONTINUED] predniSONE (DELTASONE) 10 MG tablet Take 1 tablet (10 mg total) by mouth 2 (two) times daily with a meal.   No facility-administered encounter medications on file as of 07/29/2023.    Allergies (verified) Septra [sulfamethoxazole-trimethoprim], Barbiturates, Chlordiazepoxide, Hydrocodone-acetaminophen, Pentothal [thiopental], Phenytoin, Rofecoxib, Cefuroxime axetil, Other,  and Wellbutrin [bupropion]   History: Past Medical History:  Diagnosis Date   Acute left-sided back pain with sciatica 11/27/2018   Allergy    Annual physical exam 04/12/2015   Arthritis    Phreesia 05/01/2020   Arthritis, rheumatoid (HCC) 1993   hx   Cancer (HCC)    Phreesia 05/01/2020   Cataract    Collagen vascular disease (HCC)    RA   COPD (chronic obstructive pulmonary disease) (HCC)    GERD (gastroesophageal reflux disease)    Hyperlipidemia    Hypertension    Kidney stone 1999   Non Hodgkin's lymphoma (HCC)    Obesity    Osteoporosis 2019   intolerant of fosamax due to jaw pain   Porphyria (HCC)    hx    Past Surgical History:  Procedure Laterality Date   APPENDECTOMY  1983   BREAST BIOPSY Left    diagnosed with non hogdkins lymphoma   COLONOSCOPY WITH PROPOFOL N/A 05/30/2020   Procedure: COLONOSCOPY WITH PROPOFOL;  Surgeon: Malissa Hippo, MD;  Location: [facility];  Service: Endoscopy;  Laterality: N/A;  10:30   LITHOTRIPSY  2000   rt renal stone    POLYPECTOMY  05/30/2020   Procedure: POLYPECTOMY;  Surgeon: Malissa Hippo, MD;  Location: [facility];  Service: Endoscopy;;   Family History  Problem Relation Age of Onset   Arthritis Mother    Hypertension Mother    Hypertension Sister    Arthritis Sister    Hypertension Sister    Arthritis Sister    Ulcerative colitis Sister    Hypertension Sister    Arthritis Sister    Heart disease Sister    Varicose Veins Sister    Stroke Maternal Grandmother    Arthritis Maternal Grandfather    Hearing loss Maternal Grandfather    Arthritis Paternal Grandmother    Stroke Paternal Grandfather    Hyperlipidemia Sister    Hypertension Sister    Social History   Socioeconomic History   Marital status: Single    Spouse name: Not on file   Number of children: 0   Years of education: 12+   Highest education level: Doctorate  Occupational History   Occupation: Retired; Teacher part time currently   Tobacco Use   Smoking status: Former    Current packs/day: 0.00    Average packs/day: 0.3 packs/day for 29.8 years (7.5 ttl pk-yrs)    Types: Cigarettes    Start date: 05/25/2005    Quit date: 08/25/2022    Years since quitting: 0.9    Passive exposure: Current   Smokeless tobacco: Never   Tobacco comments:    Smoke when stressed  Vaping Use   Vaping status: Never Used  Substance and Sexual Activity   Alcohol use: No   Drug use: No   Sexual activity: Not Currently    Birth control/protection: Post-menopausal    Comment: for approximately 12 yrs  Other Topics Concern   Not on file  Social History Narrative   Lives alone    Social Drivers of Health   Financial Resource Strain: Low Risk  (07/29/2023)   Overall Financial Resource Strain (CARDIA)    Difficulty of Paying Living Expenses: Not hard at all  Food Insecurity: No Food Insecurity (07/29/2023)   Hunger Vital Sign    Worried About Running Out of Food in the Last Year: Never true    Ran Out of Food in the Last Year: Never true  Transportation Needs: No Transportation Needs (07/29/2023)   PRAPARE - Administrator, Civil Service (Medical): No    Lack of Transportation (Non-Medical): No  Physical Activity: Inactive (07/29/2023)   Exercise Vital Sign    Days of Exercise per Week: 0 days    Minutes of Exercise per Session: 0 min  Stress: No Stress Concern Present (07/29/2023)   [employer] of Occupational Health - Occupational Stress Questionnaire    Feeling of Stress : Not at all  Social Connections: Moderately Integrated (07/29/2023)   Social Connection and Isolation Panel [NHANES]    Frequency of Communication with Friends and Family: More than three times a week    Frequency of Social Gatherings with Friends and Family: More than three times a week    Attends Religious Services: More than 4 times per year    Active Member of [employer] or Organizations: Yes    Attends Engineer, structural: More than 4 times  per year    Marital Status: Never married  Tobacco Counseling Counseling given: Yes Tobacco comments: Smoke when stressed    Clinical Intake:  Pre-visit preparation completed: Yes  Pain : No/denies pain     BMI - recorded: 31.01 Nutritional Risks: None Diabetes: No  How often do you need to have someone help you when you read instructions, pamphlets, or other written materials from your doctor or pharmacy?: 1 - Never  Interpreter Needed?: No  Information entered by :: A Daric Koren, CMA   Activities of Daily Living     07/29/2023   12:50 AM  In your present state of health, do you have any difficulty performing the following activities:  Hearing? 0  Vision? 0  Difficulty concentrating or making decisions? 0  Walking or climbing stairs? 0  Dressing or bathing? 0  Doing errands, shopping? 0  Preparing Food and eating ? N  Using the Toilet? N  In the past six months, have you accidently leaked urine? N  Do you have problems with loss of bowel control? N  Managing your Medications? N  Managing your Finances? N  Housekeeping or managing your Housekeeping? N    Patient Care Team: Kerri Perches, MD as PCP - General Zenovia Jordan, MD as Consulting Physician (Rheumatology) Mickie Bail, RN as Oncology Nurse Navigator (Oncology) Doreatha Massed, MD as Medical Oncologist (Oncology) Zenovia Jordan, MD as Consulting Physician (Rheumatology) Roma Kayser, MD as Consulting Physician (Endocrinology) Iran Ouch, MD as Consulting Physician (Cardiology) Doreatha Massed, MD as Consulting Physician (Hematology) Saporito, Fanny Dance, LCSW as VBCI Care Management (Licensed Clinical Social Worker) Dione Booze, Bertram Millard, MD as Consulting Physician (Ophthalmology)  Indicate any recent Medical Services you may have received from other than [facility] providers in the past year (date may be approximate).     Assessment:   This is a routine wellness  examination for [facility].  Hearing/Vision screen Hearing Screening - Comments:: Patient denies any hearing difficulties.   Vision Screening - Comments:: Wears rx glasses - up to date with routine eye exams  Sees Dr. Dione Booze in [place]   Goals Addressed             This Visit's Progress    Patient Stated       To remain as active and healthy as positive.        Depression Screen     07/29/2023   11:25 AM 04/28/2023   11:10 AM 04/01/2023   10:12 AM 09/24/2022   10:00 AM 05/30/2022    1:23 PM 05/30/2022    1:22 PM 03/25/2022   10:17 AM  PHQ 2/9 Scores  PHQ - 2 Score 0 0 1 0 0 0 0  PHQ- 9 Score 0 0         Fall Risk     07/29/2023   12:50 AM 04/28/2023   11:10 AM 04/01/2023   10:12 AM 09/24/2022   10:00 AM 05/30/2022    1:23 PM  Fall Risk   Falls in the past year? 0 0 0 1 1  Number falls in past yr: 0 0 0 0 0  Injury with Fall? 0 0 0 0 0  Risk for fall due to : History of fall(s) No Fall Risks No Fall Risks History of fall(s)   Follow up Falls prevention discussed;Falls evaluation completed Falls evaluation completed Falls evaluation completed Falls evaluation completed     MEDICARE RISK AT HOME:  Medicare Risk at Home Any stairs in or around the home?: (Patient-Rptd) Yes If so, are there any  without handrails?: (Patient-Rptd) No Home free of loose throw rugs in walkways, pet beds, electrical cords, etc?: (Patient-Rptd) Yes Adequate lighting in your home to reduce risk of falls?: (Patient-Rptd) Yes Life alert?: (Patient-Rptd) No Use of a cane, walker or w/c?: (Patient-Rptd) No Grab bars in the bathroom?: (Patient-Rptd) No Shower chair or bench in shower?: (Patient-Rptd) No Elevated toilet seat or a handicapped toilet?: (Patient-Rptd) No  TIMED UP AND GO:  Was the test performed?  No  Cognitive Function: 6CIT completed        07/29/2023   11:23 AM 05/30/2022    1:25 PM 05/21/2021    3:47 PM 05/02/2020   10:06 AM 05/02/2019    9:58 AM  6CIT Screen  What Year? 0 points 0  points 0 points 0 points 0 points  What month? 0 points 0 points 0 points 0 points 0 points  What time? 0 points 0 points 0 points 0 points 0 points  Count back from 20 0 points 0 points 0 points 0 points 0 points  Months in reverse 0 points 0 points 0 points 0 points 0 points  Repeat phrase 0 points 2 points 2 points 0 points 0 points  Total Score 0 points 2 points 2 points 0 points 0 points    Immunizations Immunization History  Administered Date(s) Administered   Fluad Quad(high Dose 65+) 03/07/2019, 02/14/2021, 03/22/2022, 03/18/2023   Influenza Inj Mdck Quad Pf 03/18/2017   Influenza Split 02/04/2020   Influenza Whole 06/16/2006, 05/17/2009   Influenza,inj,Quad PF,6+ Mos 02/12/2018, 02/04/2020   Influenza-Unspecified 02/12/2018   MODERNA COVID-19 SARS-COV-2 PEDS BIVALENT BOOSTER 49yr-27yr 02/28/2021   Moderna Covid-19 Vaccine Bivalent Booster 48yrs & up 03/31/2023   Moderna Sars-Covid-2 Vaccination 06/30/2019, 07/29/2019, 03/10/2020, 09/03/2020   Pneumococcal Conjugate-13 12/21/2017   Pneumococcal Polysaccharide-23 06/07/2019   Td 06/16/2006   Td (Adult), 2 Lf Tetanus Toxid, Preservative Free 06/16/2006   Tdap 03/24/2014   Zoster, Live 09/28/2014    Screening Tests Health Maintenance  Topic Date Due   Zoster Vaccines- Shingrix (1 of 2) 11[birth year]28/1969   COVID-19 Vaccine (7 - 2024-25 season) 05/26/2023   DTaP/Tdap/Td (3 - Td or Tdap) 03/24/2024   MAMMOGRAM  05/28/2024   Medicare Annual Wellness (AWV)  07/28/2024   DEXA SCAN  05/28/2025   Colonoscopy  05/30/2030   Pneumonia Vaccine 66+ Years old  Completed   INFLUENZA VACCINE  Completed   Hepatitis C Screening  Completed   HPV VACCINES  Aged Out    Health Maintenance  Health Maintenance Due  Topic Date Due   Zoster Vaccines- Shingrix (1 of 2) 11/28[birth year]1971   COVID-19 Vaccine (7 - 2024-25 season) 05/26/2023   Health Maintenance Items Addressed: Health Maintenance is up to date  Additional Screening:  Vision  Screening: Recommended annual ophthalmology exams for early detection of glaucoma and other disorders of the eye.  Dental Screening: Recommended annual dental exams for proper oral hygiene  Community Resource Referral / Chronic Care Management: CRR required this visit?  No   CCM required this visit?  No     Plan:     I have personally reviewed and noted the following in the patient's chart:   Medical and social history Use of alcohol, tobacco or illicit drugs  Current medications and supplements including opioid prescriptions. Patient is not currently taking opioid prescriptions. Functional ability and status Nutritional status Physical activity Advanced directives List of other physicians Hospitalizations, surgeries, and ER visits in previous 12 months Vitals Screenings to include cognitive, depression, and  falls Referrals and appointments  In addition, I have reviewed and discussed with patient certain preventive protocols, quality metrics, and best practice recommendations. A written personalized care plan for preventive services as well as general preventive health recommendations were provided to patient.     Jordan Hawks Lyric Rossano, CMA   07/29/2023   After Visit Summary: (MyChart) Due to this being a telephonic visit, the after visit summary with patients personalized plan was offered to patient via MyChart   Notes: Nothing significant to report at this time.

## 2023-07-29 NOTE — Patient Instructions (Signed)
 [name] , Thank you for taking time to come for your Medicare Wellness Visit. I appreciate your ongoing commitment to your health goals. Please review the following plan we discussed and let me know if I can assist you in the future.   Referrals/Orders/Follow-Ups/Clinician Recommendations:  Next Medicare Annual Wellness Visit:   August 01, 2024 at 3:50 pm video visit.   This is a list of the screening recommended for you and due dates:  Health Maintenance  Topic Date Due   Zoster (Shingles) Vaccine (1 of 2) 11/28/1971   COVID-19 Vaccine (7 - 2024-25 season) 05/26/2023   DTaP/Tdap/Td vaccine (3 - Td or Tdap) 03/24/2024   Mammogram  05/28/2024   Medicare Annual Wellness Visit  07/28/2024   DEXA scan (bone density measurement)  05/28/2025   Colon Cancer Screening  05/30/2030   Pneumonia Vaccine  Completed   Flu Shot  Completed   Hepatitis C Screening  Completed   HPV Vaccine  Aged Out    Advanced directives: (Declined) Advance directive discussed with you today. Even though you declined this today, please call our office should you change your mind, and we can give you the proper paperwork for you to fill out.  Next Medicare Annual Wellness Visit scheduled for next year: yes  Understanding Your Risk for Falls Millions of people have serious injuries from falls each year. It is important to understand your risk of falling. Talk with your health care provider about your risk and what you can do to lower it. If you do have a serious fall, make sure to tell your provider. Falling once raises your risk of falling again. How can falls affect me? Serious injuries from falls are common. These include: Broken bones, such as hip fractures. Head injuries, such as traumatic brain injuries (TBI) or concussions. A fear of falling can cause you to avoid activities and stay at home. This can make your muscles weaker and raise your risk for a fall. What can increase my risk? There are a number of  risk factors that increase your risk for falling. The more risk factors you have, the higher your risk of falling. Serious injuries from a fall happen most often to people who are older than 71 years old. Teenagers and young adults ages 70-29 are also at higher risk. Common risk factors include: Weakness in the lower body. Being generally weak or confused due to long-term (chronic) illness. Dizziness or balance problems. Poor vision. Medicines that cause dizziness or drowsiness. These may include: Medicines for your blood pressure, heart, anxiety, insomnia, or swelling (edema). Pain medicines. Muscle relaxants. Other risk factors include: Drinking alcohol. Having had a fall in the past. Having foot pain or wearing improper footwear. Working at a dangerous job. Having any of the following in your home: Tripping hazards, such as floor clutter or loose rugs. Poor lighting. Pets. Having dementia or memory loss. What actions can I take to lower my risk of falling?     Physical activity Stay physically fit. Do strength and balance exercises. Consider taking a regular class to build strength and balance. Yoga and tai chi are good options. Vision Have your eyes checked every year and your prescription for glasses or contacts updated as needed. Shoes and walking aids Wear non-skid shoes. Wear shoes that have rubber soles and low heels. Do not wear high heels. Do not walk around the house in socks or slippers. Use a cane or walker as told by your provider. Home safety Attach secure  railings on both sides of your stairs. Install grab bars for your bathtub, shower, and toilet. Use a non-skid mat in your bathtub or shower. Attach bath mats securely with double-sided, non-slip rug tape. Use good lighting in all rooms. Keep a flashlight near your bed. Make sure there is a clear path from your bed to the bathroom. Use night-lights. Do not use throw rugs. Make sure all carpeting is taped or  tacked down securely. Remove all clutter from walkways and stairways, including extension cords. Repair uneven or broken steps and floors. Avoid walking on icy or slippery surfaces. Walk on the grass instead of on icy or slick sidewalks. Use ice melter to get rid of ice on walkways in the winter. Use a cordless phone. Questions to ask your health care provider Can you help me check my risk for a fall? Do any of my medicines make me more likely to fall? Should I take a vitamin D supplement? What exercises can I do to improve my strength and balance? Should I make an appointment to have my vision checked? Do I need a bone density test to check for weak bones (osteoporosis)? Would it help to use a cane or a walker? Where to find more information Centers for Disease Control and Prevention, STEADI: TonerPromos.no Community-Based Fall Prevention Programs: TonerPromos.no [employer] on Aging: BaseRingTones.pl Contact a health care provider if: You fall at home. You are afraid of falling at home. You feel weak, drowsy, or dizzy. This information is not intended to replace advice given to you by your health care provider. Make sure you discuss any questions you have with your health care provider. Document Revised: 01/13/2022 Document Reviewed: 01/13/2022 Elsevier Patient Education  2024 [employer].

## 2023-07-30 ENCOUNTER — Telehealth: Payer: Self-pay

## 2023-07-30 ENCOUNTER — Other Ambulatory Visit: Payer: Self-pay

## 2023-07-30 NOTE — Telephone Encounter (Signed)
 Auth Submission: NO AUTH NEEDED Site of care: Site of care: [facility] Payer: [facility] medicare Medication & CPT/J Code(s) submitted: Reclast (Zolendronic acid) [id number] Route of submission (phone, fax, portal): portal Phone # Fax # Auth type: Buy/Bill PB Units/visits requested: 5mg , 1 dose Reference number: [id number] Approval from: 07/30/23 to 05/25/24

## 2023-08-12 ENCOUNTER — Encounter: Attending: "Endocrinology | Admitting: Internal Medicine

## 2023-08-12 VITALS — BP 128/86 | HR 79 | Temp 98.3°F | Resp 16

## 2023-08-12 DIAGNOSIS — M818 Other osteoporosis without current pathological fracture: Secondary | ICD-10-CM | POA: Diagnosis not present

## 2023-08-12 MED ORDER — ZOLEDRONIC ACID 5 MG/100ML IV SOLN
5.0000 mg | Freq: Once | INTRAVENOUS | Status: AC
  Administered 2023-08-12: 5 mg via INTRAVENOUS

## 2023-08-12 NOTE — Addendum Note (Signed)
 Addended by: Cleotilde Neer on: 08/12/2023 02:12 PM   Modules accepted: Orders

## 2023-08-12 NOTE — Progress Notes (Signed)
 Diagnosis: Osteoporosis  Provider:  Danella Deis MD  Procedure: IV Infusion  IV Type: Peripheral, IV Location: R Hand  Reclast (Zolendronic Acid), Dose: 5 mg  Infusion Start Time: 1034  Infusion Stop Time: 1106  Post Infusion IV Care: Observation period completed  Discharge: Condition: Good, Destination: Home . AVS Provided  Performed by:  Cleotilde Neer, LPN

## 2023-09-21 DIAGNOSIS — J069 Acute upper respiratory infection, unspecified: Secondary | ICD-10-CM | POA: Diagnosis not present

## 2023-09-23 ENCOUNTER — Ambulatory Visit: Payer: Self-pay | Admitting: *Deleted

## 2023-09-23 ENCOUNTER — Encounter: Payer: Self-pay | Admitting: Family Medicine

## 2023-09-23 ENCOUNTER — Ambulatory Visit (INDEPENDENT_AMBULATORY_CARE_PROVIDER_SITE_OTHER): Admitting: Internal Medicine

## 2023-09-23 ENCOUNTER — Encounter: Payer: Self-pay | Admitting: Internal Medicine

## 2023-09-23 VITALS — BP 125/76 | HR 100 | Ht 67.0 in | Wt 201.4 lb

## 2023-09-23 DIAGNOSIS — J0100 Acute maxillary sinusitis, unspecified: Secondary | ICD-10-CM

## 2023-09-23 MED ORDER — DOXYCYCLINE HYCLATE 100 MG PO TABS
100.0000 mg | ORAL_TABLET | Freq: Two times a day (BID) | ORAL | 0 refills | Status: AC
Start: 2023-09-23 — End: 2023-10-03

## 2023-09-23 MED ORDER — BENZONATATE 100 MG PO CAPS: 100.0000 mg | ORAL_CAPSULE | Freq: Two times a day (BID) | ORAL | 1 refills | Status: AC | PRN

## 2023-09-23 MED ORDER — PREDNISONE 20 MG PO TABS: 20.0000 mg | ORAL_TABLET | Freq: Every day | ORAL | 0 refills | Status: AC

## 2023-09-23 NOTE — Telephone Encounter (Signed)
  Chief Complaint: worsening cough, sinus pain using inhaler more than normal Symptoms: coughing spells, productive cough clear to yellowish sputum. Sinus pain in face better but still present. Ear pain. Post nasal drip causes constant cough, sleeping issues laying down. Has use flonase  cough med DM with no relief. Was seen in UC and received decadron for inflammation, help with sx  for 1 day . Difficulty breathing through nose at times. Frequency: Sunday  Pertinent Negatives: Patient denies chest pain no difficulty breathing no SOB  no fever Disposition: [] ED /[] Urgent Care (no appt availability in office) / [x] Appointment(In office/virtual)/ []  [facility]/ [] Home Care/ [] Refused Recommended Disposition /[] [facility]/ []  Follow-up with PCP Additional Notes:   No available appt today with PCP. Scheduled appt with other provider in office due to hx bronchitis    Copied from CRM [id number]. Topic: Clinical - Medical Advice >> Sep 23, 2023  9:36 AM [place] B wrote: Reason for CRM: pt has sinus problems getting worse with cough  And drainage, not having breathing problem, but states using inhaler more Reason for Disposition  [1] Continuous (nonstop) coughing interferes with work or school AND [2] no improvement using cough treatment per Care Advice  Answer Assessment - Initial Assessment Questions 1. ONSET: "When did the cough begin?"      Sunday  2. SEVERITY: "How bad is the cough today?"      Worsening due to drainage  3. SPUTUM: "Describe the color of your sputum" (none, dry cough; clear, white, yellow, green)     Clear to yellow  4. HEMOPTYSIS: "Are you coughing up any blood?" If so ask: "How much?" (flecks, streaks, tablespoons, etc.)     na 5. DIFFICULTY BREATHING: "Are you having difficulty breathing?" If Yes, ask: "How bad is it?" (e.g., mild, moderate, severe)    - MILD: No SOB at rest, mild SOB with walking, speaks normally in sentences, can lie down, no  retractions, pulse < 100.    - MODERATE: SOB at rest, SOB with minimal exertion and prefers to sit, cannot lie down flat, speaks in phrases, mild retractions, audible wheezing, pulse 100-120.    - SEVERE: Very SOB at rest, speaks in single words, struggling to breathe, sitting hunched forward, retractions, pulse > 120      Denies  6. FEVER: "Do you have a fever?" If Yes, ask: "What is your temperature, how was it measured, and when did it start?"     na 7. CARDIAC HISTORY: "Do you have any history of heart disease?" (e.g., heart attack, congestive heart failure)      Na  8. LUNG HISTORY: "Do you have any history of lung disease?"  (e.g., pulmonary embolus, asthma, emphysema)     Hx bronchitis  9. PE RISK FACTORS: "Do you have a history of blood clots?" (or: recent major surgery, recent prolonged travel, bedridden)     na 10. OTHER SYMPTOMS: "Do you have any other symptoms?" (e.g., runny nose, wheezing, chest pain)       Sinus pain, cough spells, post nasal drip.ear pain 11. PREGNANCY: "Is there any chance you are pregnant?" "When was your last menstrual period?"       na 12. TRAVEL: "Have you traveled out of the country in the last month?" (e.g., travel history, exposures)       na  Protocols used: Cough - Acute Productive-A-AH

## 2023-09-23 NOTE — Telephone Encounter (Signed)
Noted appointment scheduled.

## 2023-09-23 NOTE — Assessment & Plan Note (Signed)
 Presenting today for an acute visit endorsing a 4-day history of symptoms described above.  Symptoms are concerning for maxillary sinusitis.  She likely has recurrent sinusitis based on multiple presentations endorsing similar symptoms within the last year.  She was most recently treated for sinusitis in February. -Treatment options reviewed.  Doxycycline  100 mg twice daily x 10 days prescribed today for empiric antibiotic coverage.  Will add prednisone  20 mg daily x 5 days as this has been effective previously.  I have also refilled Tessalon  Perles for as needed cough relief.  She will return to care for previously scheduled follow-up with Dr. Rodolph Clap on 5/6.

## 2023-09-23 NOTE — Patient Instructions (Signed)
 It was a pleasure to see you today.  Thank you for giving us  the opportunity to be involved in your care.  Below is a brief recap of your visit and next steps.  We will plan to see you again on 5/6.  Summary Doxycycline , prednisone , and tessalon  perles prescribed today Continue additional treatment measures Return to care for routine follow up with Dr. Rodolph Clap on 5/6.

## 2023-09-23 NOTE — Progress Notes (Signed)
 Acute Office Visit  Subjective:     Patient ID: [name], female    DOB: 12-26-1950, 73 y.o.   MRN: [id number]  Chief Complaint  Patient presents with   URI    Stuffy head and nose, drainage, cough , sore throat, right ear pain, pressure and pain in the face started Sunday    [name] presents today for an acute visit endorsing a 4-day history of sinus congestion, rhinorrhea, bilateral ear discomfort, and throat irritation.  She endorses a history of recurrent sinus infections, most recently treated in February 2025.  She believes that her current symptoms were triggered by leaving a window open overnight last weekend.  She has experienced chills but no fever.  Nasal secretions are yellow.  She is currently using Tylenol  for pain relief as well as an over-the-counter allergy medication, cough syrup, and Tessalon  Perles.  She has previously been prescribed an albuterol  inhaler for as needed use.  She went to urgent care on Monday (4/28) and received an IM Decadron injection.  There has been no significant improvement.  Review of Systems  Constitutional:  Positive for chills and malaise/fatigue. Negative for fever.  HENT:  Positive for congestion, ear pain, sinus pain and sore throat.   Respiratory:  Positive for cough.       Objective:    BP 125/76   Pulse 100   Ht 5\' 7"  (1.702 m)   Wt 201 lb 6.4 oz (91.4 kg)   SpO2 96%   BMI 31.54 kg/m   Physical Exam Vitals reviewed.  Constitutional:      General: She is not in acute distress.    Appearance: Normal appearance. She is not ill-appearing.  HENT:     Head: Normocephalic and atraumatic.     Right Ear: External ear normal.     Left Ear: External ear normal.     Nose: Congestion and rhinorrhea present.     Comments: There is tenderness palpation over the right maxillary sinus    Mouth/Throat:     Mouth: Mucous membranes are moist.     Pharynx: Posterior oropharyngeal erythema present. No oropharyngeal exudate.  Eyes:      Extraocular Movements: Extraocular movements intact.     Conjunctiva/sclera: Conjunctivae normal.     Pupils: Pupils are equal, round, and reactive to light.  Cardiovascular:     Rate and Rhythm: Normal rate and regular rhythm.     Pulses: Normal pulses.     Heart sounds: Normal heart sounds. No murmur heard.    No friction rub. No gallop.  Pulmonary:     Effort: Pulmonary effort is normal.     Breath sounds: Wheezing (Faint expiratory wheezes bilaterally) present.  Abdominal:     General: Abdomen is flat. Bowel sounds are normal. There is no distension.     Palpations: Abdomen is soft.     Tenderness: There is no abdominal tenderness.  Musculoskeletal:     Cervical back: Normal range of motion.  Lymphadenopathy:     Cervical: No cervical adenopathy.  Neurological:     Mental Status: She is alert.       Assessment & Plan:   Problem List Items Addressed This Visit       Acute non-recurrent maxillary sinusitis - Primary   Presenting today for an acute visit endorsing a 4-day history of symptoms described above.  Symptoms are concerning for maxillary sinusitis.  She likely has recurrent sinusitis based on multiple presentations endorsing similar symptoms within the  last year.  She was most recently treated for sinusitis in February. -Treatment options reviewed.  Doxycycline  100 mg twice daily x 10 days prescribed today for empiric antibiotic coverage.  Will add prednisone  20 mg daily x 5 days as this has been effective previously.  I have also refilled Tessalon  Perles for as needed cough relief.  She will return to care for previously scheduled follow-up with Dr. Rodolph Clap on 5/6.      Meds ordered this encounter  Medications   benzonatate  (TESSALON  PERLES) 100 MG capsule    Sig: Take 1 capsule (100 mg total) by mouth 2 (two) times daily as needed for cough.    Dispense:  30 capsule    Refill:  1   predniSONE  (DELTASONE ) 20 MG tablet    Sig: Take 1 tablet (20 mg total) by  mouth daily with breakfast for 5 days.    Dispense:  5 tablet    Refill:  0   doxycycline  (VIBRA -TABS) 100 MG tablet    Sig: Take 1 tablet (100 mg total) by mouth 2 (two) times daily for 10 days.    Dispense:  20 tablet    Refill:  0    Return if symptoms worsen or fail to improve.  Tobi Fortes, MD

## 2023-09-28 DIAGNOSIS — R7302 Impaired glucose tolerance (oral): Secondary | ICD-10-CM | POA: Diagnosis not present

## 2023-09-28 DIAGNOSIS — E7849 Other hyperlipidemia: Secondary | ICD-10-CM | POA: Diagnosis not present

## 2023-09-28 DIAGNOSIS — I10 Essential (primary) hypertension: Secondary | ICD-10-CM | POA: Diagnosis not present

## 2023-09-29 ENCOUNTER — Ambulatory Visit (INDEPENDENT_AMBULATORY_CARE_PROVIDER_SITE_OTHER): Payer: Medicare Other | Admitting: Family Medicine

## 2023-09-29 ENCOUNTER — Encounter: Payer: Self-pay | Admitting: Family Medicine

## 2023-09-29 VITALS — BP 132/84 | HR 103 | Resp 18 | Ht 67.0 in | Wt 198.1 lb

## 2023-09-29 DIAGNOSIS — E785 Hyperlipidemia, unspecified: Secondary | ICD-10-CM

## 2023-09-29 DIAGNOSIS — I10 Essential (primary) hypertension: Secondary | ICD-10-CM

## 2023-09-29 DIAGNOSIS — R7303 Prediabetes: Secondary | ICD-10-CM | POA: Diagnosis not present

## 2023-09-29 DIAGNOSIS — J0100 Acute maxillary sinusitis, unspecified: Secondary | ICD-10-CM

## 2023-09-29 DIAGNOSIS — E559 Vitamin D deficiency, unspecified: Secondary | ICD-10-CM | POA: Diagnosis not present

## 2023-09-29 DIAGNOSIS — M05769 Rheumatoid arthritis with rheumatoid factor of unspecified knee without organ or systems involvement: Secondary | ICD-10-CM

## 2023-09-29 DIAGNOSIS — R051 Acute cough: Secondary | ICD-10-CM

## 2023-09-29 DIAGNOSIS — J309 Allergic rhinitis, unspecified: Secondary | ICD-10-CM

## 2023-09-29 LAB — CMP14+EGFR
ALT: 24 IU/L (ref 0–32)
AST: 17 IU/L (ref 0–40)
Albumin: 4 g/dL (ref 3.9–4.9)
Alkaline Phosphatase: 90 IU/L (ref 44–121)
BUN/Creatinine Ratio: 17 (ref 12–28)
BUN: 14 mg/dL (ref 8–27)
Bilirubin Total: 0.5 mg/dL (ref 0.0–1.2)
CO2: 20 mmol/L (ref 20–29)
Calcium: 9.3 mg/dL (ref 8.7–10.3)
Chloride: 103 mmol/L (ref 96–106)
Creatinine, Ser: 0.82 mg/dL (ref 0.57–1.00)
Globulin, Total: 3 g/dL (ref 1.5–4.5)
Glucose: 95 mg/dL (ref 70–99)
Potassium: 4.3 mmol/L (ref 3.5–5.2)
Sodium: 140 mmol/L (ref 134–144)
Total Protein: 7 g/dL (ref 6.0–8.5)
eGFR: 77 mL/min/{1.73_m2} (ref >59)

## 2023-09-29 LAB — LIPID PANEL
Chol/HDL Ratio: 2.7 ratio (ref 0.0–4.4)
Cholesterol, Total: 191 mg/dL (ref 100–199)
HDL: 71 mg/dL (ref >39)
LDL Chol Calc (NIH): 104 mg/dL — ABNORMAL HIGH (ref 0–99)
Triglycerides: 87 mg/dL (ref 0–149)
VLDL Cholesterol Cal: 16 mg/dL (ref 5–40)

## 2023-09-29 LAB — CBC
Hematocrit: 42.3 % (ref 34.0–46.6)
Hemoglobin: 14 g/dL (ref 11.1–15.9)
MCH: 28.2 pg (ref 26.6–33.0)
MCHC: 33.1 g/dL (ref 31.5–35.7)
MCV: 85 fL (ref 79–97)
Platelets: 317 10*3/uL (ref 150–450)
RBC: 4.97 x10E6/uL (ref 3.77–5.28)
RDW: 13.9 % (ref 11.7–15.4)
WBC: 12.4 10*3/uL — ABNORMAL HIGH (ref 3.4–10.8)

## 2023-09-29 LAB — HEMOGLOBIN A1C
Est. average glucose Bld gHb Est-mCnc: 126 mg/dL
Hgb A1c MFr Bld: 6 % — ABNORMAL HIGH (ref 4.8–5.6)

## 2023-09-29 NOTE — Patient Instructions (Addendum)
 Annual exam 11/07 or after call if you need me sooner  Take chlorpheniramine  one daily for next 5 days , then stop  Take flonase , loratidine and montelukast  every day as prescribed  If symptoms persist in 2 weeks pls send a message.  You ar GREATLY improved based on the history you give  Pls reduce fied and fatty ffods  Fasting lipid, cmp and EGFr, HBa1C, CBC, TSH and vit D 3 to 5 days before Nov appointment  It is important that you exercise regularly at least 30 minutes 5 times a week. If you develop chest pain, have severe difficulty breathing, or feel very tired, stop exercising immediately and seek medical attention  Think about what you will eat, plan ahead. Choose " clean, green, fresh or frozen" over canned, processed or packaged foods which are more sugary, salty and fatty. 70 to 75% of food eaten should be vegetables and fruit. Three meals at set times with snacks allowed between meals, but they must be fruit or vegetables. Aim to eat over a 12 hour period , example 7 am to 7 pm, and STOP after  your last meal of the day. Drink water,generally about 64 ounces per day, no other drink is as healthy. Fruit juice is best enjoyed in a healthy way, by EATING the fruit. Thanks for choosing [facility], we consider it a privelige to serve you.

## 2023-09-30 ENCOUNTER — Encounter: Payer: Self-pay | Admitting: Family Medicine

## 2023-09-30 NOTE — Assessment & Plan Note (Signed)
 Managed by Rheumatology, controlled on current regime

## 2023-09-30 NOTE — Assessment & Plan Note (Signed)
 Improved wth aggressive symptom management including prednisone , decrease decongestant to one daily for next 5 days,  optimize daily allergy meds

## 2023-09-30 NOTE — Progress Notes (Signed)
 [name]     MRN: [id number]      DOB: 01-19-1951  Chief Complaint  Patient presents with   Hypertension    6 month follow up     HPI [name] is here for follow up and re-evaluation of chronic medical conditions, medication management and review of any available recent lab and radiology data.  Preventive health is updated, specifically  Cancer screening and Immunization.   Developed acute right maxillary sinusitis and bronchitis approx 1 week ago with uncontrolled allergy symptoms, starting to improve since starting antibiotics. The PT denies any adverse reactions to current medications since the last visit.  She will work on food choice more to improve lipid and prediabetic status ROS Denies chest pains, palpitations and leg swelling Denies abdominal pain, nausea, vomiting,diarrhea or constipation.   Denies dysuria, frequency, hesitancy or incontinence. Denies joint pain, swelling and limitation in mobility. Denies headaches, seizures, numbness, or tingling. Denies depression, anxiety or insomnia. Denies skin break down or rash.   PE  BP 132/84   Pulse (!) 103   Resp 18   Ht 5\' 7"  (1.702 m)   Wt 198 lb 1.9 oz (89.9 kg)   SpO2 98%   BMI 31.03 kg/m   Patient alert and oriented and in no cardiopulmonary distress.  HEENT: No facial asymmetry, EOMI,     Neck supple .Mild right maxillary sinus tenderness, TM no erythema , decreased light reflex on right TM  Chest: Clear to auscultation bilaterally.  CVS: S1, S2 no murmurs, no S3.Regular rate.  ABD: Soft non tender.   Ext: No edema  MS: Adequate ROM spine, shoulders, hips and knees.  Skin: Intact, no ulcerations or rash noted.  Psych: Good eye contact, normal affect. Memory intact not anxious or depressed appearing.  CNS: CN 2-12 intact, power,  normal throughout.no focal deficits noted.   Assessment & Plan  Acute non-recurrent maxillary sinusitis Improved on antibiotic , she is to complete course ,  call in 2 weeks if not 100 % improved and asymptomatic  Cough Improved wth aggressive symptom management including prednisone , decrease decongestant to one daily for next 5 days,  optimize daily allergy meds  Hyperlipidemia LDL goal <100 Hyperlipidemia:Low fat diet discussed and encouraged.   Lipid Panel  Lab Results  Component Value Date   CHOL 191 09/28/2023   HDL 71 09/28/2023   LDLCALC 104 (H) 09/28/2023   TRIG 87 09/28/2023   CHOLHDL 2.7 09/28/2023     Needs to lower fat intake  Updated lab needed at/ before next visit.   Allergic rhinitis Daily commitment to allergy meds is discussed for improved control  Prediabetes Patient educated about the importance of limiting  Carbohydrate intake , the need to commit to daily physical activity for a minimum of 30 minutes , and to commit weight loss. The fact that changes in all these areas will reduce or eliminate all together the development of diabetes is stressed.      Latest Ref Rng & Units 09/28/2023    8:41 AM 03/27/2023    8:19 AM 10/08/2022    8:04 AM 10/08/2022    7:59 AM 08/26/2022    9:51 AM  Diabetic Labs  HbA1c 4.8 - 5.6 % 6.0  5.7      Chol 100 - 199 mg/dL 630  160  109     HDL >32 mg/dL 71  61  63     Calc LDL 0 - 99 mg/dL 355  732  202  Triglycerides 0 - 149 mg/dL 87  80  84     Creatinine 0.57 - 1.00 mg/dL 7.82  9.56   2.13  0.86       09/29/2023   10:29 AM 09/29/2023    9:57 AM 09/23/2023    1:18 PM 08/12/2023   11:09 AM 08/12/2023   10:29 AM 07/29/2023   11:21 AM 07/03/2023    1:33 PM  BP/Weight  Systolic BP 132 137 125 128 138 -- 120  Diastolic BP 84 82 76 86 92 -- 80  Wt. (Lbs)  198.12 201.4   198 198  BMI  31.03 kg/m2 31.54 kg/m2   31.01 kg/m2 31.01 kg/m2       No data to display          Unchanged Updated lab needed at/ before next visit.   Rheumatoid arthritis (HCC) Managed by Rheumatology, controlled on current regime

## 2023-09-30 NOTE — Assessment & Plan Note (Signed)
 Hyperlipidemia:Low fat diet discussed and encouraged.   Lipid Panel  Lab Results  Component Value Date   CHOL 191 09/28/2023   HDL 71 09/28/2023   LDLCALC 104 (H) 09/28/2023   TRIG 87 09/28/2023   CHOLHDL 2.7 09/28/2023     Needs to lower fat intake  Updated lab needed at/ before next visit.

## 2023-09-30 NOTE — Assessment & Plan Note (Signed)
 Improved on antibiotic , she is to complete course , call in 2 weeks if not 100 % improved and asymptomatic

## 2023-09-30 NOTE — Assessment & Plan Note (Signed)
 Patient educated about the importance of limiting  Carbohydrate intake , the need to commit to daily physical activity for a minimum of 30 minutes , and to commit weight loss. The fact that changes in all these areas will reduce or eliminate all together the development of diabetes is stressed.      Latest Ref Rng & Units 09/28/2023    8:41 AM 03/27/2023    8:19 AM 10/08/2022    8:04 AM 10/08/2022    7:59 AM 08/26/2022    9:51 AM  Diabetic Labs  HbA1c 4.8 - 5.6 % 6.0  5.7      Chol 100 - 199 mg/dL 098  119  147     HDL >82 mg/dL 71  61  63     Calc LDL 0 - 99 mg/dL 956  213  086     Triglycerides 0 - 149 mg/dL 87  80  84     Creatinine 0.57 - 1.00 mg/dL 5.78  4.69   6.29  5.28       09/29/2023   10:29 AM 09/29/2023    9:57 AM 09/23/2023    1:18 PM 08/12/2023   11:09 AM 08/12/2023   10:29 AM 07/29/2023   11:21 AM 07/03/2023    1:33 PM  BP/Weight  Systolic BP 132 137 125 128 138 -- 120  Diastolic BP 84 82 76 86 92 -- 80  Wt. (Lbs)  198.12 201.4   198 198  BMI  31.03 kg/m2 31.54 kg/m2   31.01 kg/m2 31.01 kg/m2       No data to display          Unchanged Updated lab needed at/ before next visit.

## 2023-09-30 NOTE — Assessment & Plan Note (Signed)
 Daily commitment to allergy meds is discussed for improved control

## 2023-10-01 DIAGNOSIS — Z79899 Other long term (current) drug therapy: Secondary | ICD-10-CM | POA: Diagnosis not present

## 2023-10-01 DIAGNOSIS — M069 Rheumatoid arthritis, unspecified: Secondary | ICD-10-CM | POA: Diagnosis not present

## 2023-10-01 DIAGNOSIS — H18413 Arcus senilis, bilateral: Secondary | ICD-10-CM | POA: Diagnosis not present

## 2023-10-01 DIAGNOSIS — H35372 Puckering of macula, left eye: Secondary | ICD-10-CM | POA: Diagnosis not present

## 2023-10-01 DIAGNOSIS — H2513 Age-related nuclear cataract, bilateral: Secondary | ICD-10-CM | POA: Diagnosis not present

## 2023-10-02 ENCOUNTER — Other Ambulatory Visit: Payer: Self-pay | Admitting: Family Medicine

## 2023-10-05 ENCOUNTER — Inpatient Hospital Stay: Payer: Medicare Other | Attending: Hematology

## 2023-10-05 DIAGNOSIS — C8590 Non-Hodgkin lymphoma, unspecified, unspecified site: Secondary | ICD-10-CM

## 2023-10-05 DIAGNOSIS — Z8572 Personal history of non-Hodgkin lymphomas: Secondary | ICD-10-CM | POA: Insufficient documentation

## 2023-10-05 LAB — CBC WITH DIFFERENTIAL/PLATELET
Abs Immature Granulocytes: 0.04 10*3/uL (ref 0.00–0.07)
Basophils Absolute: 0.1 10*3/uL (ref 0.0–0.1)
Basophils Relative: 0 %
Eosinophils Absolute: 0.2 10*3/uL (ref 0.0–0.5)
Eosinophils Relative: 2 %
HCT: 41.5 % (ref 36.0–46.0)
Hemoglobin: 13.3 g/dL (ref 12.0–15.0)
Immature Granulocytes: 0 %
Lymphocytes Relative: 20 %
Lymphs Abs: 2.2 10*3/uL (ref 0.7–4.0)
MCH: 27.5 pg (ref 26.0–34.0)
MCHC: 32 g/dL (ref 30.0–36.0)
MCV: 85.7 fL (ref 80.0–100.0)
Monocytes Absolute: 0.7 10*3/uL (ref 0.1–1.0)
Monocytes Relative: 7 %
Neutro Abs: 8 10*3/uL — ABNORMAL HIGH (ref 1.7–7.7)
Neutrophils Relative %: 71 %
Platelets: 250 10*3/uL (ref 150–400)
RBC: 4.84 MIL/uL (ref 3.87–5.11)
RDW: 14.6 % (ref 11.5–15.5)
WBC: 11.2 10*3/uL — ABNORMAL HIGH (ref 4.0–10.5)
nRBC: 0 % (ref 0.0–0.2)

## 2023-10-05 LAB — COMPREHENSIVE METABOLIC PANEL WITH GFR
ALT: 17 U/L (ref 0–44)
AST: 18 U/L (ref 15–41)
Albumin: 3.4 g/dL — ABNORMAL LOW (ref 3.5–5.0)
Alkaline Phosphatase: 71 U/L (ref 38–126)
Anion gap: 8 (ref 5–15)
BUN: 12 mg/dL (ref 8–23)
CO2: 25 mmol/L (ref 22–32)
Calcium: 9.1 mg/dL (ref 8.9–10.3)
Chloride: 104 mmol/L (ref 98–111)
Creatinine, Ser: 0.93 mg/dL (ref 0.44–1.00)
GFR, Estimated: >60 mL/min (ref >60)
Glucose, Bld: 92 mg/dL (ref 70–99)
Potassium: 4 mmol/L (ref 3.5–5.1)
Sodium: 137 mmol/L (ref 135–145)
Total Bilirubin: 0.8 mg/dL (ref 0.0–1.2)
Total Protein: 7 g/dL (ref 6.5–8.1)

## 2023-10-05 LAB — LACTATE DEHYDROGENASE: LDH: 122 U/L (ref 98–192)

## 2023-10-12 ENCOUNTER — Inpatient Hospital Stay: Payer: Medicare Other | Admitting: Hematology

## 2023-10-12 VITALS — BP 122/76 | HR 97 | Temp 98.5°F | Resp 20 | Wt 201.7 lb

## 2023-10-12 DIAGNOSIS — C8590 Non-Hodgkin lymphoma, unspecified, unspecified site: Secondary | ICD-10-CM | POA: Diagnosis not present

## 2023-10-12 DIAGNOSIS — Z8572 Personal history of non-Hodgkin lymphomas: Secondary | ICD-10-CM | POA: Diagnosis not present

## 2023-10-12 DIAGNOSIS — Z1231 Encounter for screening mammogram for malignant neoplasm of breast: Secondary | ICD-10-CM

## 2023-10-12 NOTE — Progress Notes (Signed)
 [facility] [address], [place] [address]    Clinic Day:  10/12/2023  Referring physician: Towanda Fret, MD  Patient Care Team: Susan Fret, MD as PCP - General Susan Edge, MD as Consulting Physician (Rheumatology) Paul, Susan G, RN as Oncology Nurse Navigator (Oncology) Susan Boros, MD as Medical Oncologist (Oncology) Susan Edge, MD as Consulting Physician (Rheumatology) Susan Bolt, MD as Consulting Physician (Endocrinology) Susan Hamilton, MD as Consulting Physician (Cardiology) Susan Boros, MD as Consulting Physician (Hematology) Paul, Susan D, LCSW as VBCI Care Management (Licensed Clinical Social Worker) [name], Susan Bouche, MD as Consulting Physician (Ophthalmology)   ASSESSMENT & PLAN:   Assessment: 1.  Clinical stage III MALT lymphoma: -Mammogram on May 12, 2019 showed left breast mass. -Biopsy consistent with non-Hodgkin's lymphoma, specifically extranodal marginal zone lymphoma of MALT with plasmacytic differentiation. -She did not have any B symptoms. PET scan on 06/17/2019 showed mild hypermetabolic area in the left breast at the site of known lymphoma. Intense FDG uptake within the retrobulbar and periorbital soft tissues without signs of visible soft tissue mass or lesion. Mildly hypermetabolic lymph nodes in the external iliac chains bilaterally. Asymmetry of the tonsillar tissue with increased activity on the left side. MRI of the orbits did not show any mass. - XRT to the left breast mass end of March 2021. -PET CT scan on 03/06/2020 showed improvement at site of prior biopsy in the left upper breast, SUV 1.2, previously 4.4.  Stable mildly prominent bilateral external iliac lymph nodes with no new adenopathy.   2. Rheumatoid arthritis: -She was diagnosed in her 51s. Currently on Plaquenil.    Plan: 1.  Clinical stage III MALT lymphoma: - She denies any fevers, night  sweats or weight loss in the last 1 year.  She had sinus infection twice in February and April of this year.  She was recently treated with doxycycline , prednisone . - Physical exam: No palpable adenopathy or splenomegaly. - Labs from 10/05/2023: Normal LFTs and LDH.  CBC grossly normal with improving mildly elevated white count of 11.2, predominantly neutrophils. - Reviewed mammogram from 05/29/2023: BI-RADS Category 1. - Recommend follow-up in 1 year with repeat mammogram and labs.   2. Rheumatoid arthritis: - Continue Plaquenil.    Orders Placed This Encounter  Procedures   MM 3D SCREENING MAMMOGRAM BILATERAL BREAST    Standing Status:   Future    Expected Date:   06/02/2024    Expiration Date:   10/11/2024    Reason for Exam (SYMPTOM  OR DIAGNOSIS REQUIRED):   breast cancer screening    Preferred imaging location?:   [facility]   CBC with Differential    Standing Status:   Future    Expected Date:   10/11/2024    Expiration Date:   10/11/2024   Comprehensive metabolic panel    Standing Status:   Future    Expected Date:   10/11/2024    Expiration Date:   10/11/2024   Lactate dehydrogenase    Standing Status:   Future    Expected Date:   10/11/2024    Expiration Date:   10/11/2024      [name],acting as a scribe for Susan Boros, MD.,have documented all relevant documentation on the behalf of Susan Boros, MD,as directed by  Susan Boros, MD while in the presence of Susan Boros, MD.  I, Susan Boros MD, have reviewed the above documentation for accuracy and completeness, and I agree  with the above.    Susan Boros, MD   5/19/202512:39 PM  CHIEF COMPLAINT:   Diagnosis: MALT lymphoma    Cancer Staging  No matching staging information was found for the patient.    Prior Therapy: XRT to left breast, completed 07/2019  Current Therapy:  surveillance   HISTORY OF PRESENT ILLNESS:   Oncology History   No history  exists.     INTERVAL HISTORY:   [name] is a 73 y.o. female presenting to clinic today for follow up of MALT lymphoma. She was last seen by me on 10/13/22.  Since her last visit, she underwent bilateral screening mammogram on 05/29/23 that found: No mammographic evidence of malignancy.   [name] had a bone density scan on 05/29/23 with a T-score of -3.0, which is considered osteoporotic.   Today, she states that she is doing well overall. Her appetite level is at 75%. Her energy level is at 50%.  PAST MEDICAL HISTORY:   Past Medical History: Past Medical History:  Diagnosis Date   Acute left-sided back pain with sciatica 11/27/2018   Allergy    Annual physical exam 04/12/2015   Arthritis    Phreesia 05/01/2020   Arthritis, rheumatoid (HCC) 1993   hx   Cancer (HCC)    Phreesia 05/01/2020   Cataract    Collagen vascular disease (HCC)    RA   COPD (chronic obstructive pulmonary disease) (HCC)    GERD (gastroesophageal reflux disease)    Hyperlipidemia    Hypertension    Kidney stone 1999   Non Hodgkin's lymphoma (HCC)    Obesity    Osteoporosis 2019   intolerant of fosamax  due to jaw pain   Porphyria (HCC)    hx     Surgical History: Past Surgical History:  Procedure Laterality Date   APPENDECTOMY  1983   BREAST BIOPSY Left    diagnosed with non hogdkins lymphoma   COLONOSCOPY WITH PROPOFOL  N/A 05/30/2020   Procedure: COLONOSCOPY WITH PROPOFOL ;  Surgeon: Ruby Corporal, MD;  Location: [facility];  Service: Endoscopy;  Laterality: N/A;  10:30   LITHOTRIPSY  2000   rt renal stone    POLYPECTOMY  05/30/2020   Procedure: POLYPECTOMY;  Surgeon: Ruby Corporal, MD;  Location: [facility];  Service: Endoscopy;;    Social History: Social History   Socioeconomic History   Marital status: Single    Spouse name: Not on file   Number of children: 0   Years of education: 12+   Highest education level: Doctorate  Occupational History   Occupation: Retired; Teacher  part time currently  Tobacco Use   Smoking status: Former    Current packs/day: 0.00    Average packs/day: 0.3 packs/day for 29.8 years (7.5 ttl pk-yrs)    Types: Cigarettes    Start date: 05/25/2005    Quit date: 08/25/2022    Years since quitting: 1.1    Passive exposure: Current   Smokeless tobacco: Never   Tobacco comments:    Smoke when stressed  Vaping Use   Vaping status: Never Used  Substance and Sexual Activity   Alcohol use: No   Drug use: No   Sexual activity: Not Currently    Birth control/protection: Post-menopausal    Comment: for approximately 12 yrs  Other Topics Concern   Not on file  Social History Narrative   Lives alone    Social Drivers of Health   Financial Resource Strain: Low Risk  (07/29/2023)   Overall Financial  Resource Strain (CARDIA)    Difficulty of Paying Living Expenses: Not hard at all  Food Insecurity: No Food Insecurity (07/29/2023)   Hunger Vital Sign    Worried About Running Out of Food in the Last Year: Never true    Ran Out of Food in the Last Year: Never true  Transportation Needs: No Transportation Needs (07/29/2023)   PRAPARE - Administrator, Civil Service (Medical): No    Lack of Transportation (Non-Medical): No  Physical Activity: Inactive (07/29/2023)   Exercise Vital Sign    Days of Exercise per Week: 0 days    Minutes of Exercise per Session: 0 min  Stress: No Stress Concern Present (07/29/2023)   [employer] of Occupational Health - Occupational Stress Questionnaire    Feeling of Stress : Not at all  Social Connections: Moderately Integrated (07/29/2023)   Social Connection and Isolation Panel [NHANES]    Frequency of Communication with Friends and Family: More than three times a week    Frequency of Social Gatherings with Friends and Family: More than three times a week    Attends Religious Services: More than 4 times per year    Active Member of [employer] or Organizations: Yes    Attends Banker  Meetings: More than 4 times per year    Marital Status: Never married  Intimate Partner Violence: Not At Risk (07/29/2023)   Humiliation, Afraid, Rape, and Kick questionnaire    Fear of Current or Ex-Partner: No    Emotionally Abused: No    Physically Abused: No    Sexually Abused: No    Family History: Family History  Problem Relation Age of Onset   Arthritis Mother    Hypertension Mother    Hypertension Sister    Arthritis Sister    Hypertension Sister    Arthritis Sister    Ulcerative colitis Sister    Hypertension Sister    Arthritis Sister    Heart disease Sister    Varicose Veins Sister    Stroke Maternal Grandmother    Arthritis Maternal Grandfather    Hearing loss Maternal Grandfather    Arthritis Paternal Grandmother    Stroke Paternal Grandfather    Hyperlipidemia Sister    Hypertension Sister     Current Medications:  Current Outpatient Medications:    acetaminophen  (TYLENOL ) 500 MG tablet, Take 1,000 mg by mouth every 6 (six) hours as needed for moderate pain., Disp: , Rfl:    albuterol  (VENTOLIN  HFA) 108 (90 Base) MCG/ACT inhaler, Inhale 2 puffs into the lungs every 6 (six) hours as needed for wheezing or shortness of breath., Disp: 8 g, Rfl: 2   aspirin 81 MG EC tablet, Take 1 tablet (81 mg total) by mouth daily., Disp: 30 tablet, Rfl: 12   benzonatate  (TESSALON  PERLES) 100 MG capsule, Take 1 capsule (100 mg total) by mouth 2 (two) times daily as needed for cough., Disp: 30 capsule, Rfl: 1   Calcium  Carbonate-Vitamin D  600-400 MG-UNIT tablet, Take 1 tablet by mouth 2 (two) times daily with a meal.  , Disp: , Rfl:    Cholecalciferol (VITAMIN D3) 5000 units TABS, Take 5,000 Units by mouth every other day., Disp: , Rfl:    Coenzyme Q10 100 MG capsule, Take 100 mg by mouth 3 (three) times daily., Disp: , Rfl:    Collagen Hydrolysate POWD, by Does not apply route 2 (two) times daily., Disp: , Rfl:    Dextromethorphan-guaiFENesin (ROBITUSSIN COUGH+CHEST CONG DM)  20-200 MG/20ML LIQD, ,  Disp: , Rfl:    EPINEPHrine  0.3 mg/0.3 mL IJ SOAJ injection, Inject 0.3 mg into the muscle as needed for anaphylaxis., Disp: 1 each, Rfl: 2   fluconazole  (DIFLUCAN ) 150 MG tablet, Take 150 mg by mouth once., Disp: , Rfl:    fluticasone  (FLONASE ) 50 MCG/ACT nasal spray, Place 2 sprays into both nostrils daily. (Patient taking differently: Place 2 sprays into both nostrils daily as needed for allergies.), Disp: 48 g, Rfl: 1   gabapentin  (NEURONTIN ) 100 MG capsule, Take 1 capsule (100 mg total) by mouth at bedtime., Disp: 90 capsule, Rfl: 3   hydroxychloroquine (PLAQUENIL) 200 MG tablet, Take 400 mg by mouth daily. , Disp: , Rfl:    loratadine  (CLARITIN ) 10 MG tablet, Take one tablet by mouth one to  two times daily u, Disp: 60 tablet, Rfl: 5   losartan  (COZAAR ) 50 MG tablet, Take 1 tablet (50 mg total) by mouth daily., Disp: 90 tablet, Rfl: 3   montelukast  (SINGULAIR ) 10 MG tablet, TAKE 1 TABLET BY MOUTH AT BEDTIME, Disp: 90 tablet, Rfl: 0   Multiple Vitamins-Minerals (CENTRUM SILVER PO), Take 1 tablet by mouth daily., Disp: , Rfl:    nicotine (NICODERM CQ) 21 mg/24hr patch, Place 1 patch (21 mg total) onto the skin daily., Disp: , Rfl:    Omega-3 Fatty Acids (FISH OIL ) 1000 MG CAPS, Take 1 capsule (1,000 mg total) by mouth daily., Disp: , Rfl: 0   omeprazole (PRILOSEC) 20 MG capsule, Take 20 mg by mouth at bedtime., Disp: , Rfl:    pravastatin  (PRAVACHOL ) 10 MG tablet, Take 1 tablet (10 mg total) by mouth daily., Disp: 90 tablet, Rfl: 3   Turmeric 500 MG CAPS, Take 1 capsule by mouth., Disp: , Rfl:    vitamin B-12 (CYANOCOBALAMIN) 50 MCG tablet, Take 50 mcg by mouth daily., Disp: , Rfl:    Zinc 50 MG TABS, Take 50 mg by mouth daily., Disp: , Rfl:    Allergies: Allergies  Allergen Reactions   Septra [Sulfamethoxazole-Trimethoprim] Dermatitis   Barbiturates Other (See Comments)    Prophyria    Chlordiazepoxide Other (See Comments)    unknown   Hydrocodone -Acetaminophen   Hives and Other (See Comments)   Pentothal [Thiopental]     Hives?   Phenytoin Other (See Comments)    Prophyria    Rofecoxib Other (See Comments)    Prophyria    Cefuroxime Axetil Rash   Other    Wellbutrin  [Bupropion ] Palpitations    REVIEW OF SYSTEMS:   Review of Systems  Constitutional:  Negative for chills, fatigue and fever.  HENT:   Negative for lump/mass, mouth sores, nosebleeds, sore throat and trouble swallowing.   Eyes:  Negative for eye problems.  Respiratory:  Positive for cough. Negative for shortness of breath.   Cardiovascular:  Negative for chest pain, leg swelling and palpitations.  Gastrointestinal:  Negative for abdominal pain, constipation, diarrhea, nausea and vomiting.  Genitourinary:  Negative for bladder incontinence, difficulty urinating, dysuria, frequency, hematuria and nocturia.   Musculoskeletal:  Negative for arthralgias, back pain, flank pain, myalgias and neck pain.  Skin:  Negative for itching and rash.  Neurological:  Negative for dizziness, headaches and numbness.  Hematological:  Does not bruise/bleed easily.  Psychiatric/Behavioral:  Positive for sleep disturbance. Negative for depression and suicidal ideas. The patient is not nervous/anxious.   All other systems reviewed and are negative.    VITALS:   Blood pressure 122/76, pulse 97, temperature 98.5 F (36.9 C), temperature source Tympanic, resp. rate 20,  weight 201 lb 11.5 oz (91.5 kg), SpO2 98%.  Wt Readings from Last 3 Encounters:  10/12/23 201 lb 11.5 oz (91.5 kg)  09/29/23 198 lb 1.9 oz (89.9 kg)  09/23/23 201 lb 6.4 oz (91.4 kg)    Body mass index is 31.59 kg/m.  Performance status (ECOG): 1 - Symptomatic but completely ambulatory  PHYSICAL EXAM:   Physical Exam Vitals and nursing note reviewed. Exam conducted with a chaperone present.  Constitutional:      Appearance: Normal appearance.  Cardiovascular:     Rate and Rhythm: Normal rate and regular rhythm.     Pulses:  Normal pulses.     Heart sounds: Normal heart sounds.  Pulmonary:     Effort: Pulmonary effort is normal.     Breath sounds: Normal breath sounds.  Abdominal:     Palpations: Abdomen is soft. There is no hepatomegaly, splenomegaly or mass.     Tenderness: There is no abdominal tenderness.  Musculoskeletal:     Right lower leg: No edema.     Left lower leg: No edema.  Lymphadenopathy:     Cervical: No cervical adenopathy.     Right cervical: No superficial, deep or posterior cervical adenopathy.    Left cervical: No superficial, deep or posterior cervical adenopathy.     Upper Body:     Right upper body: No supraclavicular or axillary adenopathy.     Left upper body: No supraclavicular or axillary adenopathy.  Neurological:     General: No focal deficit present.     Mental Status: She is alert and oriented to person, place, and time.  Psychiatric:        Mood and Affect: Mood normal.        Behavior: Behavior normal.     LABS:      Latest Ref Rng & Units 10/05/2023   10:52 AM 09/28/2023    8:41 AM 10/08/2022    7:59 AM  CBC  WBC 4.0 - 10.5 K/uL 11.2  12.4  8.2   Hemoglobin 12.0 - 15.0 g/dL 81.1  91.4  78.2   Hematocrit 36.0 - 46.0 % 41.5  42.3  40.4   Platelets 150 - 400 K/uL 250  317  274       Latest Ref Rng & Units 10/05/2023   10:52 AM 09/28/2023    8:41 AM 03/27/2023    8:19 AM  CMP  Glucose 70 - 99 mg/dL 92  95  88   BUN 8 - 23 mg/dL 12  14  11    Creatinine 0.44 - 1.00 mg/dL 9.56  2.13  0.86   Sodium 135 - 145 mmol/L 137  140  142   Potassium 3.5 - 5.1 mmol/L 4.0  4.3  4.4   Chloride 98 - 111 mmol/L 104  103  104   CO2 22 - 32 mmol/L 25  20  25    Calcium  8.9 - 10.3 mg/dL 9.1  9.3  9.3   Total Protein 6.5 - 8.1 g/dL 7.0  7.0  6.8   Total Bilirubin 0.0 - 1.2 mg/dL 0.8  0.5  0.4   Alkaline Phos 38 - 126 U/L 71  90  71   AST 15 - 41 U/L 18  17  20    ALT 0 - 44 U/L 17  24  13       No results found for: "CEA1", "CEA" / No results found for: "CEA1", "CEA" No  results found for: "PSA1" No results found for: "VHQ469" No  results found for: "CAN125"  Lab Results  Component Value Date   TOTALPROTELP 7.7 06/07/2019   No results found for: "TIBC", "FERRITIN", "IRONPCTSAT" Lab Results  Component Value Date   LDH 122 10/05/2023   LDH 134 10/08/2022   LDH 141 10/07/2021     STUDIES:   No results found.

## 2023-10-27 ENCOUNTER — Encounter: Payer: Self-pay | Admitting: Family Medicine

## 2023-10-27 MED ORDER — ALBUTEROL SULFATE HFA 108 (90 BASE) MCG/ACT IN AERS: 2.0000 | INHALATION_SPRAY | Freq: Four times a day (QID) | RESPIRATORY_TRACT | 4 refills | Status: AC | PRN

## 2023-12-14 ENCOUNTER — Ambulatory Visit: Payer: 59 | Admitting: "Endocrinology

## 2023-12-22 ENCOUNTER — Ambulatory Visit (INDEPENDENT_AMBULATORY_CARE_PROVIDER_SITE_OTHER): Admitting: "Endocrinology

## 2023-12-22 ENCOUNTER — Encounter: Payer: Self-pay | Admitting: "Endocrinology

## 2023-12-22 VITALS — BP 124/82 | HR 84 | Ht 67.0 in | Wt 200.4 lb

## 2023-12-22 DIAGNOSIS — M818 Other osteoporosis without current pathological fracture: Secondary | ICD-10-CM

## 2023-12-22 DIAGNOSIS — R7303 Prediabetes: Secondary | ICD-10-CM

## 2023-12-22 NOTE — Progress Notes (Signed)
 12/22/2023     Endocrinology follow-up note     Past Medical History:  Diagnosis Date   Acute left-sided back pain with sciatica 11/27/2018   Allergy    Annual physical exam 04/12/2015   Arthritis    Phreesia 05/01/2020   Arthritis, rheumatoid (HCC) 1993   hx   Cancer (HCC)    Phreesia 05/01/2020   Cataract    Collagen vascular disease (HCC)    RA   COPD (chronic obstructive pulmonary disease) (HCC)    GERD (gastroesophageal reflux disease)    Hyperlipidemia    Hypertension    Kidney stone 1999   Non Hodgkin's lymphoma (HCC)    Obesity    Osteoporosis 2019   intolerant of fosamax  due to jaw pain   Porphyria (HCC)    hx    Past Surgical History:  Procedure Laterality Date   APPENDECTOMY  1983   BREAST BIOPSY Left    diagnosed with non hogdkins lymphoma   COLONOSCOPY WITH PROPOFOL  N/A 05/30/2020   Procedure: COLONOSCOPY WITH PROPOFOL ;  Surgeon: Golda Claudis PENNER, MD;  Location: [facility];  Service: Endoscopy;  Laterality: N/A;  10:30   LITHOTRIPSY  2000   rt renal stone    POLYPECTOMY  05/30/2020   Procedure: POLYPECTOMY;  Surgeon: Golda Claudis PENNER, MD;  Location: [facility];  Service: Endoscopy;;   Social History   Socioeconomic History   Marital status: Single    Spouse name: Not on file   Number of children: 0   Years of education: 12+   Highest education level: Doctorate  Occupational History   Occupation: Retired; Teacher part time currently  Tobacco Use   Smoking status: Former    Current packs/day: 0.00    Average packs/day: 0.3 packs/day for 29.8 years (7.5 ttl pk-yrs)    Types: Cigarettes    Start date: 05/25/2005    Quit date: 08/25/2022    Years since quitting: 1.3    Passive exposure: Current   Smokeless tobacco: Never   Tobacco comments:    Smoke when stressed  Vaping Use   Vaping status: Never Used  Substance and Sexual Activity   Alcohol use: No   Drug use: No    Sexual activity: Not Currently    Birth control/protection: Post-menopausal    Comment: for approximately 12 yrs  Other Topics Concern   Not on file  Social History Narrative   Lives alone    Social Drivers of Health   Financial Resource Strain: Low Risk  (07/29/2023)   Overall Financial Resource Strain (CARDIA)    Difficulty of Paying Living Expenses: Not hard at all  Food Insecurity: No Food Insecurity (07/29/2023)   Hunger Vital Sign    Worried About Running Out of Food in the Last Year: Never true    Ran Out of Food in the Last Year: Never true  Transportation Needs: No Transportation Needs (07/29/2023)   PRAPARE - Administrator, Civil Service (Medical): No    Lack of Transportation (Non-Medical): No  Physical Activity: Inactive (07/29/2023)   Exercise Vital Sign    Days of Exercise per Week: 0 days  Minutes of Exercise per Session: 0 min  Stress: No Stress Concern Present (07/29/2023)   [employer] of Occupational Health - Occupational Stress Questionnaire    Feeling of Stress : Not at all  Social Connections: Moderately Integrated (07/29/2023)   Social Connection and Isolation Panel    Frequency of Communication with Friends and Family: More than three times a week    Frequency of Social Gatherings with Friends and Family: More than three times a week    Attends Religious Services: More than 4 times per year    Active Member of [employer] or Organizations: Yes    Attends Banker Meetings: More than 4 times per year    Marital Status: Never married   Outpatient Encounter Medications as of 12/22/2023  Medication Sig   acetaminophen  (TYLENOL ) 500 MG tablet Take 1,000 mg by mouth every 6 (six) hours as needed for moderate pain.   albuterol  (VENTOLIN  HFA) 108 (90 Base) MCG/ACT inhaler Inhale 2 puffs into the lungs every 6 (six) hours as needed for wheezing or shortness of breath.   aspirin 81 MG EC tablet Take 1 tablet (81 mg total) by mouth daily.    benzonatate  (TESSALON  PERLES) 100 MG capsule Take 1 capsule (100 mg total) by mouth 2 (two) times daily as needed for cough.   Calcium  Carbonate-Vitamin D  600-400 MG-UNIT tablet Take 1 tablet by mouth 2 (two) times daily with a meal.     Cholecalciferol (VITAMIN D3) 5000 units TABS Take 5,000 Units by mouth every other day.   Coenzyme Q10 100 MG capsule Take 100 mg by mouth 3 (three) times daily.   Collagen Hydrolysate POWD by Does not apply route 2 (two) times daily.   Dextromethorphan-guaiFENesin (ROBITUSSIN COUGH+CHEST CONG DM) 20-200 MG/20ML LIQD    EPINEPHrine  0.3 mg/0.3 mL IJ SOAJ injection Inject 0.3 mg into the muscle as needed for anaphylaxis.   fluconazole  (DIFLUCAN ) 150 MG tablet Take 150 mg by mouth once.   fluticasone  (FLONASE ) 50 MCG/ACT nasal spray Place 2 sprays into both nostrils daily. (Patient taking differently: Place 2 sprays into both nostrils daily as needed for allergies.)   gabapentin  (NEURONTIN ) 100 MG capsule Take 1 capsule (100 mg total) by mouth at bedtime.   hydroxychloroquine (PLAQUENIL) 200 MG tablet Take 400 mg by mouth daily.    loratadine  (CLARITIN ) 10 MG tablet Take one tablet by mouth one to  two times daily u   losartan  (COZAAR ) 50 MG tablet Take 1 tablet (50 mg total) by mouth daily.   montelukast  (SINGULAIR ) 10 MG tablet TAKE 1 TABLET BY MOUTH AT BEDTIME   Multiple Vitamins-Minerals (CENTRUM SILVER PO) Take 1 tablet by mouth daily.   nicotine (NICODERM CQ) 21 mg/24hr patch Place 1 patch (21 mg total) onto the skin daily.   Omega-3 Fatty Acids (FISH OIL ) 1000 MG CAPS Take 1 capsule (1,000 mg total) by mouth daily.   omeprazole (PRILOSEC) 20 MG capsule Take 20 mg by mouth at bedtime.   pravastatin  (PRAVACHOL ) 10 MG tablet Take 1 tablet (10 mg total) by mouth daily.   Turmeric 500 MG CAPS Take 1 capsule by mouth.   vitamin B-12 (CYANOCOBALAMIN) 50 MCG tablet Take 50 mcg by mouth daily.   Zinc 50 MG TABS Take 50 mg by mouth daily.   No facility-administered  encounter medications on file as of 12/22/2023.   ALLERGIES: Allergies  Allergen Reactions   Septra [Sulfamethoxazole-Trimethoprim] Dermatitis   Barbiturates Other (See Comments)    Prophyria    Chlordiazepoxide  Other (See Comments)    unknown   Hydrocodone -Acetaminophen  Hives and Other (See Comments)   Pentothal [Thiopental]     Hives?   Phenytoin Other (See Comments)    Prophyria    Rofecoxib Other (See Comments)    Prophyria    Cefuroxime Axetil Rash   Other    Wellbutrin  [Bupropion ] Palpitations    VACCINATION STATUS: Immunization History  Administered Date(s) Administered   Fluad Quad(high Dose 65+) 03/07/2019, 02/14/2021, 03/22/2022, 03/18/2023   Influenza Inj Mdck Quad Pf 03/18/2017   Influenza Split 02/04/2020   Influenza Whole 06/16/2006, 05/17/2009   Influenza,inj,Quad PF,6+ Mos 02/12/2018, 02/04/2020   Influenza-Unspecified 02/12/2018   MODERNA COVID-19 SARS-COV-2 PEDS BIVALENT BOOSTER 15yr-78yr 02/28/2021   Moderna Covid-19 Vaccine Bivalent Booster 27yrs & up 03/31/2023   Moderna Sars-Covid-2 Vaccination 06/30/2019, 07/29/2019, 03/10/2020, 09/03/2020   Pneumococcal Conjugate-13 12/21/2017   Pneumococcal Polysaccharide-23 06/07/2019   Td 06/16/2006   Td (Adult), 2 Lf Tetanus Toxid, Preservative Free 06/16/2006   Tdap 03/24/2014   Zoster, Live 09/28/2014     HPI   [name] is 73 y.o. female who presents today with a medical history as above. she is being seen in follow-up after she was seen in consultation for  Osteoporosis requested by Antonetta Rollene BRAVO, MD.  Patient was diagnosed with osteoporosis  approximately  3 years ago. She denies fractures or falls. No dizziness/vertigo/orthostasis. She is status post her third Reclast  infusion in April 2025.  She tolerated the medication very well.  Based on her last bone density from January 2025, there was some slight improvement however no significant change compared to her previous study in 2022.   She  denies any interval fractures or falls.  She previously took Fosamax  70 mg p.o. weekly, intermittently. She took 12/24 months roughly, prior to her last visit.  she is on regular calcium  and vitamin D  supplement.  Her previsit labs are favorable with calcium  at 9.1, normal kidney function.  She also eats dairy and green, leafy, vegetables.   No weight bearing exercises.   No h/o hyper/hypocalcemia. No h/o hyperparathyroidism. No h/o kidney stones.  Pt does have a FH of osteoporosis  in her mother. She denies height loss. She is a survivor of  NHL.  Review of Systems  Constitutional: no weight gain/loss, no fatigue, no subjective hyperthermia, no subjective hypothermia Eyes: no blurry vision, no xerophthalmia ENT: no sore throat, no nodules palpated in throat, no dysphagia/odynophagia, no hoarseness   Objective:    BP 124/82   Pulse 84   Ht 5' 7 (1.702 m)   Wt 200 lb 6.4 oz (90.9 kg)   BMI 31.39 kg/m   Wt Readings from Last 3 Encounters:  12/22/23 200 lb 6.4 oz (90.9 kg)  10/12/23 201 lb 11.5 oz (91.5 kg)  09/29/23 198 lb 1.9 oz (89.9 kg)    Physical Exam  Constitutional:  BMI of 31,  not in acute distress, normal state of mind Eyes: PERRLA, EOMI, no exophthalmos ENT: moist mucous membranes, no thyromegaly, no cervical lymphadenopathy   CMP ( most recent) CMP     Component Value Date/Time   NA 137 10/05/2023 1052   NA 140 09/28/2023 0841   K 4.0 10/05/2023 1052   CL 104 10/05/2023 1052   CO2 25 10/05/2023 1052   GLUCOSE 92 10/05/2023 1052   BUN 12 10/05/2023 1052   BUN 14 09/28/2023 0841   CREATININE 0.93 10/05/2023 1052   CREATININE 0.85 11/17/2018 0743   CALCIUM  9.1 10/05/2023 1052  PROT 7.0 10/05/2023 1052   PROT 7.0 09/28/2023 0841   ALBUMIN 3.4 (L) 10/05/2023 1052   ALBUMIN 4.0 09/28/2023 0841   AST 18 10/05/2023 1052   ALT 17 10/05/2023 1052   ALKPHOS 71 10/05/2023 1052   BILITOT 0.8 10/05/2023 1052   BILITOT 0.5 09/28/2023 0841   GFRNONAA >60  10/05/2023 1052   GFRNONAA 72 11/17/2018 0743   GFRAA >60 10/05/2019 1427   GFRAA 83 11/17/2018 0743     Diabetic Labs (most recent): Lab Results  Component Value Date   HGBA1C 6.0 (H) 09/28/2023   HGBA1C 5.7 (H) 03/27/2023   HGBA1C 5.4 11/17/2018     Lipid Panel ( most recent) Lipid Panel     Component Value Date/Time   CHOL 191 09/28/2023 0841   TRIG 87 09/28/2023 0841   HDL 71 09/28/2023 0841   CHOLHDL 2.7 09/28/2023 0841   CHOLHDL 3.0 10/08/2022 0804   VLDL 17 10/08/2022 0804   LDLCALC 104 (H) 09/28/2023 0841   LDLCALC 122 (H) 11/17/2018 0743   LABVLDL 16 09/28/2023 0841      Lab Results  Component Value Date   TSH 1.060 03/27/2023   TSH 0.739 03/24/2022   TSH 1.020 03/20/2021   TSH 0.90 05/10/2018   TSH 0.71 05/24/2016   TSH 0.803 04/11/2015   TSH 0.770 12/24/2011   TSH 1.766 12/19/2010   TSH 0.933 10/06/2007     DXA  : T- Score -3.0 spine, 04/2021, -2.9 in 03/2018 DXA : Femur -3.0 2022, -3.1 2019     Bone density on May 29, 2023  AP Spine L1-L3 05/29/2023 70.4 Osteoporosis -2.8 0.830 g/cm2 1.8% - AP Spine L1-L3 05/17/2021 68.4 Osteoporosis -3.0 0.815 g/cm2 0.1% - AP Spine L1-L3 03/31/2018 65.3 Osteoporosis -3.0 0.814 g/cm2 - -   DualFemur Total Right 05/29/2023 70.4 Osteoporosis -3.0 0.628 g/cm2 0.5% - DualFemur Total Right 05/17/2021 68.4 Osteoporosis -3.0 0.625 g/cm2 0.8% - DualFemur Total Right 03/31/2018 65.3 Osteoporosis -3.1 0.620 g/cm2 - -   DualFemur Total Mean 05/29/2023 70.4 Osteoporosis -2.7 0.662 g/cm2 -1.0% - DualFemur Total Mean 05/17/2021 68.4 Osteoporosis -2.7 0.669 g/cm2 2.5% - DualFemur Total Mean 03/31/2018 65.3 Osteoporosis -2.8 0.653 g/cm2 - - ASSESSMENT: The BMD measured at Femur Total Right is 0.628 g/cm2 with a T-score of -3.0. This patient is considered osteoporotic according to World Health Organization [facility]) criteria. The scan quality is good. Compared with the prior study on 05/17/21, the BMD of the total  mean shows no statistically significant change. L4 was excluded due to advanced degenerative changes.   Assessment: 1. Osteoporosis  2.  Prediabetes  Plan: 1. Osteoporosis - likely multifactorial vs postmenopausal  -Patient is status post her third Reclast  infusion in April 2025.  She tolerated this procedure very well, continues to have febrile renal function and calcium .   - She is advised to continue her calcium  supplement  calcium  carbonate 600-400 mg to twice a day with meals.  We discussed about increased risk of fracture, depending on the T score, greatly increased when the T score is lower than -2.5. -Her next bone density is not  due until January 2027. -Discussion and decision was made for her to continue Reclast  infusion yearly until her next bone density.  Depending on her findings, she will be considered for either drug holiday or continued intervention with Reclast  or her next options. - discussed fall precautions    -She is advised to lower her vitamin D3 to 5000 units every other day. Regarding her prediabetes, her  most recent A1c was 6% on Sep 28, 2023 - she acknowledges that there is a room for improvement in her food and drink choices. - Suggestion is made for her to avoid simple carbohydrates  from her diet including Cakes, Sweet Desserts, Ice Cream, Soda (diet and regular), Sweet Tea, Candies, Chips, Cookies, Store Bought Juices, Alcohol , Artificial Sweeteners,  Coffee Creamer, and Sugar-free Products, Lemonade. This will help patient to have more stable blood glucose profile and potentially avoid unintended weight gain.  - I advised patient to maintain close follow up with Antonetta Rollene BRAVO, MD for primary care needs.   I spent  25  minutes in the care of the patient today including review of labs from Thyroid  Function, CMP, and other relevant labs ; imaging/biopsy records (current and previous including abstractions from other facilities); face-to-face time  discussing  her lab results and symptoms, medications doses, her options of short and long term treatment based on the latest standards of care / guidelines;   and documenting the encounter.  Decie Verne Scharfenberg  participated in the discussions, expressed understanding, and voiced agreement with the above plans.  All questions were answered to her satisfaction. she is encouraged to contact clinic should she have any questions or concerns prior to her return visit.   Follow up plan: Return in about 6 months (around 06/23/2024) for F/U with Pre-visit Labs.   Ranny Earl, MD [facility] [address] [place], [place] [address] Phone: [phone]  Fax: [phone]     12/22/2023, 12:57 PM  This note was partially dictated with voice recognition software. Similar sounding words can be transcribed inadequately or may not  be corrected upon review.

## 2023-12-29 DIAGNOSIS — H209 Unspecified iridocyclitis: Secondary | ICD-10-CM | POA: Diagnosis not present

## 2023-12-29 DIAGNOSIS — M0579 Rheumatoid arthritis with rheumatoid factor of multiple sites without organ or systems involvement: Secondary | ICD-10-CM | POA: Diagnosis not present

## 2023-12-29 DIAGNOSIS — Z79899 Other long term (current) drug therapy: Secondary | ICD-10-CM | POA: Diagnosis not present

## 2023-12-29 DIAGNOSIS — M1991 Primary osteoarthritis, unspecified site: Secondary | ICD-10-CM | POA: Diagnosis not present

## 2023-12-31 ENCOUNTER — Other Ambulatory Visit: Payer: Self-pay | Admitting: Family Medicine

## 2024-03-26 ENCOUNTER — Other Ambulatory Visit: Payer: Self-pay | Admitting: Family Medicine

## 2024-04-05 ENCOUNTER — Encounter: Payer: Self-pay | Admitting: Family Medicine

## 2024-04-05 ENCOUNTER — Ambulatory Visit: Admitting: Family Medicine

## 2024-04-05 VITALS — BP 130/85 | HR 83 | Resp 16 | Ht 67.0 in | Wt 203.1 lb

## 2024-04-05 DIAGNOSIS — Z0000 Encounter for general adult medical examination without abnormal findings: Secondary | ICD-10-CM

## 2024-04-05 DIAGNOSIS — M25561 Pain in right knee: Secondary | ICD-10-CM | POA: Insufficient documentation

## 2024-04-05 MED ORDER — IBUPROFEN 800 MG PO TABS
800.0000 mg | ORAL_TABLET | Freq: Three times a day (TID) | ORAL | 0 refills | Status: AC | PRN
Start: 2024-04-05 — End: ?

## 2024-04-05 MED ORDER — PREDNISONE 5 MG PO TABS: 5.0000 mg | ORAL_TABLET | Freq: Two times a day (BID) | ORAL | 0 refills | Status: AC

## 2024-04-05 MED ORDER — PRAVASTATIN SODIUM 10 MG PO TABS: 10.0000 mg | ORAL_TABLET | Freq: Every day | ORAL | 3 refills | Status: AC

## 2024-04-05 MED ORDER — MONTELUKAST SODIUM 10 MG PO TABS: 10.0000 mg | ORAL_TABLET | Freq: Every day | ORAL | 0 refills | Status: AC

## 2024-04-05 MED ORDER — LOSARTAN POTASSIUM 50 MG PO TABS: 50.0000 mg | ORAL_TABLET | Freq: Every day | ORAL | 3 refills | Status: AC

## 2024-04-05 NOTE — Assessment & Plan Note (Signed)

## 2024-04-05 NOTE — Patient Instructions (Addendum)
 F/U in 6 months  Pls check pharmacy, from our records you need both TdaP and shingrix vaccines   X ray right knee today  Fasting labs ordered in May this week please  It is important that you exercise regularly at least 30 minutes 5 times a week. If you develop chest pain, have severe difficulty breathing, or feel very tired, stop exercising immediately and seek medical attention   Think about what you will eat, plan ahead. Choose  clean, green, fresh or frozen over canned, processed or packaged foods which are more sugary, salty and fatty. 70 to 75% of food eaten should be vegetables and fruit. Three meals at set times with snacks allowed between meals, but they must be fruit or vegetables. Aim to eat over a 12 hour period , example 7 am to 7 pm, and STOP after  your last meal of the day. Drink water,generally about 64 ounces per day, no other drink is as healthy. Fruit juice is best enjoyed in a healthy way, by EATING the fruit.  Thanks for choosing [facility], we consider it a privelige to serve you.

## 2024-04-05 NOTE — Progress Notes (Signed)
    [name]     MRN: [id number]      DOB: 07-Sep-1950  Chief Complaint  Patient presents with   Annual Exam    cpe    HPI: Patient is in for annual physical exam. Indirect trauma to right knee ,bent the knee trying to kill a bug, knee acutely got swollen, could barely weight bear approx 5 dayas used a cane still  has local tenderness and crepituuson right side, notes increased pan at most a 2 , and has been walking for the past week . Labs to be reviewedImmunization is reviewed , and  will be updated at the pharmacy  PE: BP 130/85   Pulse 83   Resp 16   Ht 5' 7 (1.702 m)   Wt 203 lb 1.9 oz (92.1 kg)   SpO2 95%   BMI 31.81 kg/m   Pleasant  female, alert and oriented x 3, in no cardio-pulmonary distress. Afebrile. HEENT No facial trauma or asymetry. Sinuses non tender.  Extra occullar muscles intact.. External ears normal, . Neck: supple, no adenopathy,JVD or thyromegaly.No bruits.  Chest: Clear to ascultation bilaterally.No crackles or wheezes. Non tender to palpation  Cardiovascular system; Heart sounds normal,  S1 and  S2 ,no S3.  No murmur, or thrill. Apical beat not displaced Peripheral pulses normal.  Abdomen: Soft, non tender, no organomegaly or masses. No bruits. Bowel sounds normal. No guarding, tenderness or rebound.      Musculoskeletal exam: Full ROM of spine, hips , shoulders and reduced in right  knee which is deformed and tender over lateral aspect  No muscle wasting or atrophy.   Neurologic: Cranial nerves 2 to 12 intact. Power, tone ,sensation and reflexes normal throughout. No disturbance in gait. No tremor.  Skin: Intact, no ulceration, erythema , scaling or rash noted. Pigmentation normal throughout  Psych; Normal mood and affect. Judgement and concentration normal   Assessment & Plan:  Annual physical exam Annual exam as documented. Counseling done  re healthy lifestyle involving commitment to 150 minutes exercise per  week, heart healthy diet, and attaining healthy weight.The importance of adequate sleep also discussed. Regular seat belt use and home safety, is also discussed. Changes in health habits are decided on by the patient with goals and time frames  set for achieving them. Immunization and cancer screening needs are specifically addressed at this visit.   Lateral knee pain, right S/p injury approx 5 weeks ago, obtain x ray and limited amt of prednisone  5 mg tab prescribed  for as needed use

## 2024-04-05 NOTE — Assessment & Plan Note (Signed)
 S/p injury approx 5 weeks ago, obtain x ray and limited amt of prednisone  5 mg tab prescribed  for as needed use

## 2024-04-06 ENCOUNTER — Ambulatory Visit (HOSPITAL_COMMUNITY)
Admission: RE | Admit: 2024-04-06 | Discharge: 2024-04-06 | Disposition: A | Discharge status: Home / Self Care | Source: Ambulatory Visit | Attending: Family Medicine | Admitting: Family Medicine

## 2024-04-06 DIAGNOSIS — M25561 Pain in right knee: Secondary | ICD-10-CM | POA: Diagnosis present

## 2024-04-07 ENCOUNTER — Ambulatory Visit: Payer: Self-pay | Admitting: Family Medicine

## 2024-04-07 LAB — CBC WITH DIFFERENTIAL/PLATELET
Basophils Absolute: 0.1 x10E3/uL (ref 0.0–0.2)
Basos: 1 %
EOS (ABSOLUTE): 0.2 x10E3/uL (ref 0.0–0.4)
Eos: 3 %
Hematocrit: 40 % (ref 34.0–46.6)
Hemoglobin: 12.9 g/dL (ref 11.1–15.9)
Immature Grans (Abs): 0 x10E3/uL (ref 0.0–0.1)
Immature Granulocytes: 0 %
Lymphocytes Absolute: 2.4 x10E3/uL (ref 0.7–3.1)
Lymphs: 33 %
MCH: 27.9 pg (ref 26.6–33.0)
MCHC: 32.3 g/dL (ref 31.5–35.7)
MCV: 87 fL (ref 79–97)
Monocytes Absolute: 0.6 x10E3/uL (ref 0.1–0.9)
Monocytes: 8 %
Neutrophils Absolute: 4 x10E3/uL (ref 1.4–7.0)
Neutrophils: 55 %
Platelets: 274 x10E3/uL (ref 150–450)
RBC: 4.62 x10E6/uL (ref 3.77–5.28)
RDW: 14.1 % (ref 11.7–15.4)
WBC: 7.3 x10E3/uL (ref 3.4–10.8)

## 2024-04-07 LAB — CMP14+EGFR
ALT: 13 IU/L (ref 0–32)
AST: 18 IU/L (ref 0–40)
Albumin: 3.8 g/dL (ref 3.8–4.8)
Alkaline Phosphatase: 72 IU/L (ref 49–135)
BUN/Creatinine Ratio: 22 (ref 12–28)
BUN: 20 mg/dL (ref 8–27)
Bilirubin Total: 0.4 mg/dL (ref 0.0–1.2)
CO2: 24 mmol/L (ref 20–29)
Calcium: 9.3 mg/dL (ref 8.7–10.3)
Chloride: 105 mmol/L (ref 96–106)
Creatinine, Ser: 0.91 mg/dL (ref 0.57–1.00)
Globulin, Total: 2.7 g/dL (ref 1.5–4.5)
Glucose: 90 mg/dL (ref 70–99)
Potassium: 4.3 mmol/L (ref 3.5–5.2)
Sodium: 142 mmol/L (ref 134–144)
Total Protein: 6.5 g/dL (ref 6.0–8.5)
eGFR: 67 mL/min/1.73 (ref >59)

## 2024-04-07 LAB — LIPID PANEL
Chol/HDL Ratio: 2.9 ratio (ref 0.0–4.4)
Cholesterol, Total: 187 mg/dL (ref 100–199)
HDL: 64 mg/dL (ref >39)
LDL Chol Calc (NIH): 110 mg/dL — ABNORMAL HIGH (ref 0–99)
Triglycerides: 72 mg/dL (ref 0–149)
VLDL Cholesterol Cal: 13 mg/dL (ref 5–40)

## 2024-04-07 LAB — HEMOGLOBIN A1C
Est. average glucose Bld gHb Est-mCnc: 120 mg/dL
Hgb A1c MFr Bld: 5.8 % — ABNORMAL HIGH (ref 4.8–5.6)

## 2024-04-07 LAB — TSH: TSH: 0.904 u[IU]/mL (ref 0.450–4.500)

## 2024-04-07 LAB — VITAMIN D 25 HYDROXY (VIT D DEFICIENCY, FRACTURES): Vit D, 25-Hydroxy: 52.1 ng/mL (ref 30.0–100.0)

## 2024-04-08 ENCOUNTER — Ambulatory Visit: Payer: Self-pay | Admitting: Family Medicine

## 2024-05-30 ENCOUNTER — Ambulatory Visit (HOSPITAL_COMMUNITY)
Admission: RE | Admit: 2024-05-30 | Discharge: 2024-05-30 | Disposition: A | Discharge status: Home / Self Care | Source: Ambulatory Visit | Attending: Hematology | Admitting: Hematology

## 2024-05-30 DIAGNOSIS — Z1231 Encounter for screening mammogram for malignant neoplasm of breast: Secondary | ICD-10-CM | POA: Diagnosis present

## 2024-06-17 ENCOUNTER — Other Ambulatory Visit: Payer: Self-pay | Admitting: Family Medicine

## 2024-06-17 DIAGNOSIS — M0570 Rheumatoid arthritis with rheumatoid factor of unspecified site without organ or systems involvement: Secondary | ICD-10-CM

## 2024-06-17 NOTE — Assessment & Plan Note (Signed)
 Refer to Rheumatology for f/u

## 2024-06-21 ENCOUNTER — Telehealth: Payer: Self-pay | Admitting: "Endocrinology

## 2024-06-21 DIAGNOSIS — M818 Other osteoporosis without current pathological fracture: Secondary | ICD-10-CM

## 2024-06-21 DIAGNOSIS — R7303 Prediabetes: Secondary | ICD-10-CM

## 2024-06-21 NOTE — Telephone Encounter (Signed)
 Please update labs

## 2024-06-22 NOTE — Telephone Encounter (Signed)
Lab orders updated and sent to [place].

## 2024-06-28 ENCOUNTER — Ambulatory Visit: Admitting: "Endocrinology

## 2024-07-19 ENCOUNTER — Ambulatory Visit: Admitting: "Endocrinology

## 2024-08-01 ENCOUNTER — Ambulatory Visit

## 2024-08-16 ENCOUNTER — Ambulatory Visit

## 2024-10-04 ENCOUNTER — Ambulatory Visit: Admitting: Family Medicine

## 2024-10-11 ENCOUNTER — Ambulatory Visit: Admitting: Oncology

## 2024-10-11 ENCOUNTER — Other Ambulatory Visit

## 2024-10-11 ENCOUNTER — Inpatient Hospital Stay: Admitting: Oncology
